# Patient Record
Sex: Male | Born: 1975 | Race: Black or African American | Hispanic: No | Marital: Married | Smoking: Never smoker
Health system: Southern US, Community
[De-identification: ages and names within clinical notes are randomized; demographics above are authoritative.]

## PROBLEM LIST (undated history)

## (undated) DIAGNOSIS — M109 Gout, unspecified: Secondary | ICD-10-CM

## (undated) DIAGNOSIS — I10 Essential (primary) hypertension: Secondary | ICD-10-CM

## (undated) DIAGNOSIS — M542 Cervicalgia: Secondary | ICD-10-CM

## (undated) DIAGNOSIS — K219 Gastro-esophageal reflux disease without esophagitis: Secondary | ICD-10-CM

## (undated) DIAGNOSIS — G8929 Other chronic pain: Secondary | ICD-10-CM

## (undated) DIAGNOSIS — E785 Hyperlipidemia, unspecified: Secondary | ICD-10-CM

## (undated) DIAGNOSIS — Z5189 Encounter for other specified aftercare: Secondary | ICD-10-CM

## (undated) DIAGNOSIS — K635 Polyp of colon: Secondary | ICD-10-CM

## (undated) HISTORY — PX: COLONOSCOPY: SHX174

## (undated) HISTORY — PX: ESOPHAGOGASTRODUODENOSCOPY: SHX1529

## (undated) HISTORY — PX: ANKLE FRACTURE SURGERY: SHX122

## (undated) HISTORY — DX: Hyperlipidemia, unspecified: E78.5

## (undated) HISTORY — DX: Encounter for other specified aftercare: Z51.89

## (undated) HISTORY — DX: Polyp of colon: K63.5

## (undated) HISTORY — PX: OTHER SURGICAL HISTORY: SHX169

---

## 1997-10-13 ENCOUNTER — Encounter: Admission: RE | Admit: 1997-10-13 | Discharge: 1997-10-13 | Payer: Self-pay | Admitting: *Deleted

## 1998-04-08 ENCOUNTER — Encounter: Payer: Self-pay | Admitting: Emergency Medicine

## 1998-04-08 ENCOUNTER — Emergency Department (HOSPITAL_COMMUNITY): Admission: EM | Admit: 1998-04-08 | Discharge: 1998-04-08 | Payer: Self-pay | Admitting: Emergency Medicine

## 2002-07-25 ENCOUNTER — Ambulatory Visit (HOSPITAL_COMMUNITY): Admission: RE | Admit: 2002-07-25 | Discharge: 2002-07-25 | Payer: Self-pay | Admitting: Gastroenterology

## 2002-07-28 ENCOUNTER — Encounter: Payer: Self-pay | Admitting: Internal Medicine

## 2002-07-28 ENCOUNTER — Encounter: Admission: RE | Admit: 2002-07-28 | Discharge: 2002-07-28 | Payer: Self-pay | Admitting: Internal Medicine

## 2002-08-11 ENCOUNTER — Encounter: Admission: RE | Admit: 2002-08-11 | Discharge: 2002-08-11 | Payer: Self-pay | Admitting: Gastroenterology

## 2002-08-11 ENCOUNTER — Encounter: Payer: Self-pay | Admitting: Gastroenterology

## 2002-08-29 ENCOUNTER — Ambulatory Visit (HOSPITAL_COMMUNITY): Admission: RE | Admit: 2002-08-29 | Discharge: 2002-08-29 | Payer: Self-pay | Admitting: Gastroenterology

## 2013-11-02 ENCOUNTER — Encounter (HOSPITAL_BASED_OUTPATIENT_CLINIC_OR_DEPARTMENT_OTHER): Payer: Self-pay | Admitting: Emergency Medicine

## 2013-11-02 ENCOUNTER — Emergency Department (HOSPITAL_BASED_OUTPATIENT_CLINIC_OR_DEPARTMENT_OTHER)
Admission: EM | Admit: 2013-11-02 | Discharge: 2013-11-02 | Disposition: A | Discharge status: Home / Self Care | Payer: BC Managed Care – PPO | Attending: Emergency Medicine | Admitting: Emergency Medicine

## 2013-11-02 DIAGNOSIS — I10 Essential (primary) hypertension: Secondary | ICD-10-CM | POA: Insufficient documentation

## 2013-11-02 DIAGNOSIS — K219 Gastro-esophageal reflux disease without esophagitis: Secondary | ICD-10-CM

## 2013-11-02 HISTORY — DX: Gastro-esophageal reflux disease without esophagitis: K21.9

## 2013-11-02 HISTORY — DX: Essential (primary) hypertension: I10

## 2013-11-02 LAB — BASIC METABOLIC PANEL
Anion gap: 13 (ref 5–15)
BUN: 9 mg/dL (ref 6–23)
CALCIUM: 9.1 mg/dL (ref 8.4–10.5)
CO2: 25 mEq/L (ref 19–32)
Chloride: 103 mEq/L (ref 96–112)
Creatinine, Ser: 1.1 mg/dL (ref 0.50–1.35)
GFR calc Af Amer: >90 mL/min (ref >90)
GFR, EST NON AFRICAN AMERICAN: 82 mL/min — AB (ref >90)
Glucose, Bld: 110 mg/dL — ABNORMAL HIGH (ref 70–99)
POTASSIUM: 4 meq/L (ref 3.7–5.3)
Sodium: 141 mEq/L (ref 137–147)

## 2013-11-02 LAB — TROPONIN I

## 2013-11-02 MED ORDER — ESOMEPRAZOLE MAGNESIUM 20 MG PO CPDR: 40.0000 mg | DELAYED_RELEASE_CAPSULE | Freq: Every morning | ORAL | Status: DC

## 2013-11-02 MED ORDER — LISINOPRIL 20 MG PO TABS: 40.0000 mg | ORAL_TABLET | Freq: Every day | ORAL | Status: DC

## 2013-11-02 NOTE — ED Notes (Signed)
C/o acid reflux states he takes nexium every day. States his reflux has been worse for last 3 days. Nothing over the counter seems to help. No other sx.

## 2013-11-02 NOTE — Discharge Instructions (Signed)
Hypertension  Hypertension, commonly called high blood pressure, is when the force of blood pumping through your arteries is too strong. Your arteries are the blood vessels that carry blood from your heart throughout your body. A blood pressure reading consists of a higher number over a lower number, such as 110/72. The higher number (systolic) is the pressure inside your arteries when your heart pumps. The lower number (diastolic) is the pressure inside your arteries when your heart relaxes. Ideally you want your blood pressure below 120/80.  Hypertension forces your heart to work harder to pump blood. Your arteries may become narrow or stiff. Having hypertension puts you at risk for heart disease, stroke, and other problems.   RISK FACTORS  Some risk factors for high blood pressure are controllable. Others are not.   Risk factors you cannot control include:   · Race. You may be at higher risk if you are African American.  · Age. Risk increases with age.  · Gender. Men are at higher risk than women before age 45 years. After age 65, women are at higher risk than men.  Risk factors you can control include:  · Not getting enough exercise or physical activity.  · Being overweight.  · Getting too much fat, sugar, calories, or salt in your diet.  · Drinking too much alcohol.  SIGNS AND SYMPTOMS  Hypertension does not usually cause signs or symptoms. Extremely high blood pressure (hypertensive crisis) may cause headache, anxiety, shortness of breath, and nosebleed.  DIAGNOSIS   To check if you have hypertension, your health care provider will measure your blood pressure while you are seated, with your arm held at the level of your heart. It should be measured at least twice using the same arm. Certain conditions can cause a difference in blood pressure between your right and left arms. A blood pressure reading that is higher than normal on one occasion does not mean that you need treatment. If one blood pressure reading  is high, ask your health care provider about having it checked again.  TREATMENT   Treating high blood pressure includes making lifestyle changes and possibly taking medication. Living a healthy lifestyle can help lower high blood pressure. You may need to change some of your habits.  Lifestyle changes may include:  · Following the DASH diet. This diet is high in fruits, vegetables, and whole grains. It is low in salt, red meat, and added sugars.  · Getting at least 2 1/2 hours of brisk physical activity every week.  · Losing weight if necessary.  · Not smoking.  · Limiting alcoholic beverages.  · Learning ways to reduce stress.   If lifestyle changes are not enough to get your blood pressure under control, your health care provider may prescribe medicine. You may need to take more than one. Work closely with your health care provider to understand the risks and benefits.  HOME CARE INSTRUCTIONS  · Have your blood pressure rechecked as directed by your health care provider.    · Only take medicine as directed by your health care provider. Follow the directions carefully. Blood pressure medicines must be taken as prescribed. The medicine does not work as well when you skip doses. Skipping doses also puts you at risk for problems.    · Do not smoke.    · Monitor your blood pressure at home as directed by your health care provider.   SEEK MEDICAL CARE IF:   · You think you are having a reaction to medicines taken.  ·   You have recurrent headaches or feel dizzy.  · You have swelling in your ankles.  · You have trouble with your vision.  SEEK IMMEDIATE MEDICAL CARE IF:  · You develop a severe headache or confusion.  · You have unusual weakness, numbness, or feel faint.  · You have severe chest or abdominal pain.  · You vomit repeatedly.  · You have trouble breathing.  MAKE SURE YOU:   · Understand these instructions.  · Will watch your condition.  · Will get help right away if you are not doing well or get  worse.  Document Released: 04/07/2005 Document Revised: 04/12/2013 Document Reviewed: 01/28/2013  ExitCare® Patient Information ©2015 [employer]. This information is not intended to replace advice given to you by your health care provider. Make sure you discuss any questions you have with your health care provider.  Gastroesophageal Reflux Disease, Adult  Gastroesophageal reflux disease (GERD) happens when acid from your stomach flows up into the esophagus. When acid comes in contact with the esophagus, the acid causes soreness (inflammation) in the esophagus. Over time, GERD may create small holes (ulcers) in the lining of the esophagus.  CAUSES   · Increased body weight. This puts pressure on the stomach, making acid rise from the stomach into the esophagus.  · Smoking. This increases acid production in the stomach.  · Drinking alcohol. This causes decreased pressure in the lower esophageal sphincter (valve or ring of muscle between the esophagus and stomach), allowing acid from the stomach into the esophagus.  · Late evening meals and a full stomach. This increases pressure and acid production in the stomach.  · A malformed lower esophageal sphincter.  Sometimes, no cause is found.  SYMPTOMS   · Burning pain in the lower part of the mid-chest behind the breastbone and in the mid-stomach area. This may occur twice a week or more often.  · Trouble swallowing.  · Sore throat.  · Dry cough.  · Asthma-like symptoms including chest tightness, shortness of breath, or wheezing.  DIAGNOSIS   Your caregiver may be able to diagnose GERD based on your symptoms. In some cases, X-rays and other tests may be done to check for complications or to check the condition of your stomach and esophagus.  TREATMENT   Your caregiver may recommend over-the-counter or prescription medicines to help decrease acid production. Ask your caregiver before starting or adding any new medicines.   HOME CARE INSTRUCTIONS   · Change the factors  that you can control. Ask your caregiver for guidance concerning weight loss, quitting smoking, and alcohol consumption.  · Avoid foods and drinks that make your symptoms worse, such as:  ¨ Caffeine or alcoholic drinks.  ¨ Chocolate.  ¨ Peppermint or mint flavorings.  ¨ Garlic and onions.  ¨ Spicy foods.  ¨ Citrus fruits, such as oranges, lemons, or limes.  ¨ Tomato-based foods such as sauce, chili, salsa, and pizza.  ¨ Fried and fatty foods.  · Avoid lying down for the 3 hours prior to your bedtime or prior to taking a nap.  · Eat small, frequent meals instead of large meals.  · Wear loose-fitting clothing. Do not wear anything tight around your waist that causes pressure on your stomach.  · Raise the head of your bed 6 to 8 inches with wood blocks to help you sleep. Extra pillows will not help.  · Only take over-the-counter or prescription medicines for pain, discomfort, or fever as directed by your caregiver.  ·   Do not take aspirin, ibuprofen, or other nonsteroidal anti-inflammatory drugs (NSAIDs).  SEEK IMMEDIATE MEDICAL CARE IF:   · You have pain in your arms, neck, jaw, teeth, or back.  · Your pain increases or changes in intensity or duration.  · You develop nausea, vomiting, or sweating (diaphoresis).  · You develop shortness of breath, or you faint.  · Your vomit is green, yellow, black, or looks like coffee grounds or blood.  · Your stool is red, bloody, or black.  These symptoms could be signs of other problems, such as heart disease, gastric bleeding, or esophageal bleeding.  MAKE SURE YOU:   · Understand these instructions.  · Will watch your condition.  · Will get help right away if you are not doing well or get worse.  Document Released: 01/15/2005 Document Revised: 06/30/2011 Document Reviewed: 10/25/2010  ExitCare® Patient Information ©2015 [employer]. This information is not intended to replace advice given to you by your health care provider. Make sure you discuss any questions you have with  your health care provider.

## 2013-11-02 NOTE — ED Provider Notes (Signed)
CSN: [id number]     Arrival date & time 11/02/13  4098 History   First MD Initiated Contact with Patient 11/02/13 0815     Chief Complaint  Patient presents with  . Gastrophageal Reflux     (Consider location/radiation/quality/duration/timing/severity/associated sxs/prior Treatment) Patient is a 38 y.o. male presenting with GERD. The history is provided by the patient.  Gastrophageal Reflux This is a recurrent problem. Associated symptoms include chest pain and abdominal pain. Pertinent negatives include no headaches and no shortness of breath.   patient has a history of GERD. He states his been worse over the last couple months. His pain in his epigastric area it was up to his chest. As dull. Maybe a little worse after eating. No fevers. No difficulty breathing. No weight loss no diarrhea or constipation. He states he has been on Nexium over-the-counter for 2 months. He's had no relief. He also states his blood pressures been running high. no trouble breathing. No difficulty with exertion. No peripheral edema. He's had previous endoscopy also.  Past Medical History  Diagnosis Date  . Hypertension   . GERD (gastroesophageal reflux disease)    History reviewed. No pertinent past surgical history. No family history on file. History  Substance Use Topics  . Smoking status: Never Smoker   . Smokeless tobacco: Not on file  . Alcohol Use: Not on file    Review of Systems  Constitutional: Negative for activity change and appetite change.  Eyes: Negative for pain.  Respiratory: Negative for chest tightness and shortness of breath.   Cardiovascular: Positive for chest pain. Negative for leg swelling.  Gastrointestinal: Positive for abdominal pain. Negative for nausea, vomiting and diarrhea.  Genitourinary: Negative for flank pain.  Musculoskeletal: Negative for back pain and neck stiffness.  Skin: Negative for rash.  Neurological: Negative for weakness, numbness and headaches.   Psychiatric/Behavioral: Negative for behavioral problems.      Allergies  Review of patient's allergies indicates no known allergies.  Home Medications   Prior to Admission medications   Medication Sig Start Date End Date Taking? Authorizing Provider  esomeprazole (NEXIUM) 20 MG capsule Take 2 capsules (40 mg total) by mouth every morning. 11/02/13   Juliet Rude. Dante Cooter, MD  lisinopril (PRINIVIL,ZESTRIL) 20 MG tablet Take 2 tablets (40 mg total) by mouth daily. 11/02/13   Juliet Rude. Ahman Dugdale, MD   BP 151/95  Pulse 73  Temp(Src) 98.2 F (36.8 C) (Oral)  Resp 18  Ht 5\' 9"  (1.753 m)  Wt 215 lb (97.523 kg)  BMI 31.74 kg/m2  SpO2 100% Physical Exam  Nursing note and vitals reviewed. Constitutional: He is oriented to person, place, and time. He appears well-developed and well-nourished.  HENT:  Head: Normocephalic and atraumatic.  Eyes: EOM are normal. Pupils are equal, round, and reactive to light.  Neck: Normal range of motion. Neck supple.  Cardiovascular: Normal rate, regular rhythm and normal heart sounds.   No murmur heard. Pulmonary/Chest: Effort normal and breath sounds normal.  Abdominal: Soft. Bowel sounds are normal. He exhibits no distension and no mass. There is no tenderness. There is no rebound and no guarding.  Musculoskeletal: Normal range of motion. He exhibits no edema.  Neurological: He is alert and oriented to person, place, and time. No cranial nerve deficit.  Skin: Skin is warm and dry.  Psychiatric: He has a normal mood and affect.    ED Course  Procedures (including critical care time) Labs Review Labs Reviewed  BASIC METABOLIC PANEL - Abnormal; Notable  for the following:    Glucose, Bld 110 (*)    GFR calc non Af Amer 82 (*)    All other components within normal limits  TROPONIN I    Imaging Review No results found.   EKG Interpretation   Date/Time:  Wednesday November 02 2013 08:29:05 EDT Ventricular Rate:  72 PR Interval:  152 QRS  Duration: 110 QT Interval:  354 QTC Calculation: 387 R Axis:   83 Text Interpretation:  Normal sinus rhythm Left ventricular hypertrophy  Nonspecific T wave abnormality Abnormal ECG Confirmed by Rubin PayorPICKERING  MD,  Harrold DonathNATHAN [phone]([id number]) on 11/02/2013 8:35:05 AM      MDM   Final diagnoses:  Gastroesophageal reflux disease, esophagitis presence not specified  Essential hypertension    Patient with history of GERD. Continued pain. Will increase his PPI. Also has hypertension with some nonspecific EKG changes. Will increase his lisinopril. Will need to followup with PCP and possibly gastroenterology    Juliet RudeNathan R. Rubin PayorPickering, MD 11/02/13 [phone]

## 2013-11-28 ENCOUNTER — Encounter (HOSPITAL_BASED_OUTPATIENT_CLINIC_OR_DEPARTMENT_OTHER): Payer: Self-pay | Admitting: Emergency Medicine

## 2013-11-28 ENCOUNTER — Emergency Department (HOSPITAL_BASED_OUTPATIENT_CLINIC_OR_DEPARTMENT_OTHER): Payer: BC Managed Care – PPO

## 2013-11-28 ENCOUNTER — Emergency Department (HOSPITAL_BASED_OUTPATIENT_CLINIC_OR_DEPARTMENT_OTHER)
Admission: EM | Admit: 2013-11-28 | Discharge: 2013-11-28 | Disposition: A | Discharge status: Home / Self Care | Payer: BC Managed Care – PPO | Attending: Emergency Medicine | Admitting: Emergency Medicine

## 2013-11-28 DIAGNOSIS — Y929 Unspecified place or not applicable: Secondary | ICD-10-CM | POA: Diagnosis not present

## 2013-11-28 DIAGNOSIS — X500XXA Overexertion from strenuous movement or load, initial encounter: Secondary | ICD-10-CM | POA: Insufficient documentation

## 2013-11-28 DIAGNOSIS — I10 Essential (primary) hypertension: Secondary | ICD-10-CM | POA: Diagnosis not present

## 2013-11-28 DIAGNOSIS — S82899A Other fracture of unspecified lower leg, initial encounter for closed fracture: Secondary | ICD-10-CM | POA: Insufficient documentation

## 2013-11-28 DIAGNOSIS — K219 Gastro-esophageal reflux disease without esophagitis: Secondary | ICD-10-CM | POA: Insufficient documentation

## 2013-11-28 DIAGNOSIS — S99919A Unspecified injury of unspecified ankle, initial encounter: Secondary | ICD-10-CM | POA: Insufficient documentation

## 2013-11-28 DIAGNOSIS — S8990XA Unspecified injury of unspecified lower leg, initial encounter: Secondary | ICD-10-CM | POA: Diagnosis present

## 2013-11-28 DIAGNOSIS — Z79899 Other long term (current) drug therapy: Secondary | ICD-10-CM | POA: Insufficient documentation

## 2013-11-28 DIAGNOSIS — Y9301 Activity, walking, marching and hiking: Secondary | ICD-10-CM | POA: Insufficient documentation

## 2013-11-28 DIAGNOSIS — S82831A Other fracture of upper and lower end of right fibula, initial encounter for closed fracture: Secondary | ICD-10-CM

## 2013-11-28 DIAGNOSIS — S99929A Unspecified injury of unspecified foot, initial encounter: Secondary | ICD-10-CM

## 2013-11-28 NOTE — Discharge Instructions (Signed)
Ankle Fracture A fracture is a break in a bone. A cast or splint may be used to protect the ankle and heal the break. Sometimes, surgery is needed. HOME CARE  Use crutches as told by your doctor. It is very important that you use your crutches correctly.  Do not put weight or pressure on the injured ankle until told by your doctor.  Keep your ankle raised (elevated) when sitting or lying down.  Apply ice to the ankle:  Put ice in a plastic bag.  Place a towel between your cast and the bag.  Leave the ice on for 20 minutes, 2-3 times a day.  If you have a plaster or fiberglass cast:  Do not try to scratch under the cast with any objects.  Check the skin around the cast every day. You may put lotion on red or sore areas.  Keep your cast dry and clean.  If you have a plaster splint:  Wear the splint as told by your doctor.  You can loosen the elastic around the splint if your toes get numb, tingle, or turn cold or blue.  Do not put pressure on any part of your cast or splint. It may break. Rest your plaster splint or cast only on a pillow the first 24 hours until it is fully hardened.  Cover your cast or splint with a plastic bag during showers.  Do not lower your cast or splint into water.  Take medicine as told by your doctor.  Do not drive until your doctor says it is safe.  Follow-up with your doctor as told. It is very important that you go to your follow-up visits. GET HELP IF: The swelling and discomfort gets worse.  GET HELP RIGHT AWAY IF:   Your splint or cast breaks.  You continue to have very bad pain.  You have new pain or swelling after your splint or cast was put on.  Your skin or toes below the injured ankle:  Turn blue or gray.  Feel cold, numb, or you cannot feel them.  There is a bad smell or yellowish white fluid (pus) coming from under the splint or cast. MAKE SURE YOU:   Understand these instructions.  Will watch your  condition.  Will get help right away if you are not doing well or get worse. Document Released: 02/02/2009 Document Revised: 01/26/2013 Document Reviewed: 11/04/2012 [facility] Patient Information 2015 [place], [place]. This information is not intended to replace advice given to you by your health care provider. Make sure you discuss any questions you have with your health care provider.  Your doctor has applied a splint to rest and protect your injury. Try to keep your splint clean and dry. They can be used for weeks if needed to treat serious sprains, or minor fractures. Do not put objects under your splint to scratch yourself. Do not put weight on a splint unless told to do so. Call or return immediately if you have: Increased pain or pressure around the injury.  Numbness, tingling, painful, cool toes or fingers.  Swelling or inability to move fingers or toes.  Color change to fingers or toes (blue or gray).  Wetness to splint.  Sore areas, foul odor, or redness from inside the splint.

## 2013-11-28 NOTE — ED Provider Notes (Signed)
CSN: [id number]     Arrival date & time 11/28/13  1320 History   First MD Initiated Contact with Patient 11/28/13 1433     Chief Complaint  Patient presents with  . Ankle Pain     (Consider location/radiation/quality/duration/timing/severity/associated sxs/prior Treatment) HPI 38 year old male twisted his right ankle last night walking downstairs is able to partially weight bear but still his right lateral ankle pain with minimal pain to the medial ankle skin is intact no distal weakness or numbness no other injury with no pain to his foot knee hip and no head or neck injury no back pain chest pain shortness breath abdominal pain injury to his left leg or injury to his arms or other concerns. He does not want any pain medicine. Past Medical History  Diagnosis Date  . Hypertension   . GERD (gastroesophageal reflux disease)    History reviewed. No pertinent past surgical history. History reviewed. No pertinent family history. History  Substance Use Topics  . Smoking status: Never Smoker   . Smokeless tobacco: Not on file  . Alcohol Use: No    Review of Systems See HPI.   Allergies  Review of patient's allergies indicates no known allergies.  Home Medications   Prior to Admission medications   Medication Sig Start Date End Date Taking? Authorizing Provider  esomeprazole (NEXIUM) 20 MG capsule Take 2 capsules (40 mg total) by mouth every morning. 11/02/13   Juliet RudeNathan R. Pickering, MD  lisinopril (PRINIVIL,ZESTRIL) 20 MG tablet Take 2 tablets (40 mg total) by mouth daily. 11/02/13   Juliet RudeNathan R. Pickering, MD   BP 142/103  Pulse 102  Temp(Src) 98.7 F (37.1 C) (Oral)  Resp 18  Ht 5\' 9"  (1.753 m)  Wt 219 lb (99.338 kg)  BMI 32.33 kg/m2  SpO2 98% Physical Exam  Nursing note and vitals reviewed. Constitutional:  Awake, alert, nontoxic appearance.  HENT:  Head: Atraumatic.  Eyes: Right eye exhibits no discharge. Left eye exhibits no discharge.  Neck: Neck supple.  C-S NT   Pulmonary/Chest: Effort normal. He exhibits no tenderness.  Abdominal: Soft. There is no tenderness. There is no rebound.  Musculoskeletal: He exhibits no edema.  Baseline ROM, no obvious new focal weakness. Both arms and left leg nontender. Right leg is nontender at the hip thigh knee proximal fibula calf Achilles tendon calcaneus and midfoot forefoot. Right foot dorsalis pedis pulse intact with capillary refill less than 2 seconds normal light touch including the first dorsal webspace. Right ankle has mild swelling laterally minimal if any swelling medially minimal medial tenderness over the deltoid region and medial malleolus with moderate tenderness over the lateral malleolus and lateral ankle ligaments  Neurological: He is alert.  Mental status and motor strength appears baseline for patient and situation.  Skin: No rash noted.  Psychiatric: He has a normal mood and affect.    ED Course  Procedures (including critical care time) Patient / Family / Caregiver informed of clinical course, understand medical decision-making process, and agree with plan. Labs Review Labs Reviewed - No data to display  Imaging Review No results found.   EKG Interpretation None      MDM   Final diagnoses:  Fracture of fibula, distal, right, closed    The patient appears reasonably screened and/or stabilized for discharge and I doubt any other medical condition or other [facility] requiring further screening, evaluation, or treatment in the ED at this time prior to discharge.I doubt any other [facility] precluding discharge at this time including,  but not necessarily limited to the following:compartment syndrome.   Hurman Horn, MD 12/06/13 [phone]

## 2013-11-28 NOTE — ED Notes (Signed)
Pt. Advised to keep up with his blood pressure at home when not in pain and take his medications as ordered. Blood pressure high on discharge.

## 2013-11-28 NOTE — ED Notes (Signed)
Pt c/o right ankle "twisted" walking down stairs last pm

## 2014-01-26 ENCOUNTER — Encounter (HOSPITAL_BASED_OUTPATIENT_CLINIC_OR_DEPARTMENT_OTHER): Payer: Self-pay | Admitting: Emergency Medicine

## 2014-01-26 ENCOUNTER — Emergency Department (HOSPITAL_BASED_OUTPATIENT_CLINIC_OR_DEPARTMENT_OTHER)
Admission: EM | Admit: 2014-01-26 | Discharge: 2014-01-26 | Disposition: A | Discharge status: Home / Self Care | Payer: BC Managed Care – PPO | Attending: Emergency Medicine | Admitting: Emergency Medicine

## 2014-01-26 DIAGNOSIS — K219 Gastro-esophageal reflux disease without esophagitis: Secondary | ICD-10-CM | POA: Diagnosis not present

## 2014-01-26 DIAGNOSIS — Z79899 Other long term (current) drug therapy: Secondary | ICD-10-CM | POA: Diagnosis not present

## 2014-01-26 DIAGNOSIS — I10 Essential (primary) hypertension: Secondary | ICD-10-CM | POA: Insufficient documentation

## 2014-01-26 DIAGNOSIS — M545 Low back pain: Secondary | ICD-10-CM | POA: Insufficient documentation

## 2014-01-26 DIAGNOSIS — R141 Gas pain: Secondary | ICD-10-CM | POA: Insufficient documentation

## 2014-01-26 DIAGNOSIS — Z87448 Personal history of other diseases of urinary system: Secondary | ICD-10-CM

## 2014-01-26 LAB — URINALYSIS, ROUTINE W REFLEX MICROSCOPIC
Bilirubin Urine: NEGATIVE
GLUCOSE, UA: NEGATIVE mg/dL
Hgb urine dipstick: NEGATIVE
KETONES UR: NEGATIVE mg/dL
LEUKOCYTES UA: NEGATIVE
Nitrite: NEGATIVE
PROTEIN: NEGATIVE mg/dL
Specific Gravity, Urine: 1.014 (ref 1.005–1.030)
UROBILINOGEN UA: 0.2 mg/dL (ref 0.0–1.0)
pH: 6.5 (ref 5.0–8.0)

## 2014-01-26 MED ORDER — DICYCLOMINE HCL 10 MG PO CAPS
10.0000 mg | ORAL_CAPSULE | Freq: Once | ORAL | Status: AC
  Administered 2014-01-26: 10 mg via ORAL
  Filled 2014-01-26: qty 1

## 2014-01-26 MED ORDER — DICYCLOMINE HCL 20 MG PO TABS: 20.0000 mg | ORAL_TABLET | Freq: Two times a day (BID) | ORAL | Status: DC

## 2014-01-26 NOTE — ED Provider Notes (Signed)
CSN: [id number]     Arrival date & time 01/26/14  1434 History   First MD Initiated Contact with Patient 01/26/14 1545     Chief Complaint  Patient presents with  . Abdominal Pain     (Consider location/radiation/quality/duration/timing/severity/associated sxs/prior Treatment) HPI Comments: Patient is a 38 year old male past medical history significant for hypertension, GERD presenting to the emergency department for 2 complaints. Patient states 2 days ago he was using the restroom, straining when he noted bright red blood in his urine. He states it was not painful and has not had any recurrence of episodes since. Patient is also complaining about bilateral low back pain, describes it as "gas pain" that improves after he passes gas. He has tried Gas-X with minimal improvement of his gas pains. Patient denies any fevers, chills, nausea, vomiting, abdominal pain, penile or testicular swelling or pain. He has not had any difficulty passing bowel movements. No abdominal surgical history. Patient denies any concern for sexually transmitted diseases.  Patient is a 38 y.o. male presenting with abdominal pain.  Abdominal Pain Associated symptoms: hematuria     Past Medical History  Diagnosis Date  . Hypertension   . GERD (gastroesophageal reflux disease)    History reviewed. No pertinent past surgical history. No family history on file. History  Substance Use Topics  . Smoking status: Never Smoker   . Smokeless tobacco: Not on file  . Alcohol Use: No    Review of Systems  Genitourinary: Positive for hematuria.  Musculoskeletal: Positive for back pain.  All other systems reviewed and are negative.     Allergies  Review of patient's allergies indicates no known allergies.  Home Medications   Prior to Admission medications   Medication Sig Start Date End Date Taking? Authorizing Provider  dicyclomine (BENTYL) 20 MG tablet Take 1 tablet (20 mg total) by mouth 2 (two) times daily.  01/26/14   Chenille Toor L Shashank Kwasnik, PA-C  esomeprazole (NEXIUM) 20 MG capsule Take 2 capsules (40 mg total) by mouth every morning. 11/02/13   Juliet Rude. Pickering, MD  lisinopril (PRINIVIL,ZESTRIL) 20 MG tablet Take 2 tablets (40 mg total) by mouth daily. 11/02/13   Juliet Rude. Pickering, MD   BP 155/93  Pulse 79  Temp(Src) 98.5 F (36.9 C) (Oral)  Resp 16  Ht 5\' 9"  (1.753 m)  Wt 219 lb (99.338 kg)  BMI 32.33 kg/m2  SpO2 99% Physical Exam  Nursing note and vitals reviewed. Constitutional: He is oriented to person, place, and time. He appears well-developed and well-nourished. No distress.  HENT:  Head: Normocephalic and atraumatic.  Right Ear: External ear normal.  Left Ear: External ear normal.  Nose: Nose normal.  Mouth/Throat: Oropharynx is clear and moist. No oropharyngeal exudate.  Eyes: Conjunctivae are normal.  Neck: Normal range of motion. Neck supple.  Cardiovascular: Normal rate, regular rhythm and normal heart sounds.   Pulmonary/Chest: Effort normal and breath sounds normal. No respiratory distress.  Abdominal: Soft. Bowel sounds are normal. He exhibits no distension. There is no tenderness. There is no rebound and no guarding.  Genitourinary:  Deferred   Musculoskeletal: Normal range of motion. He exhibits no edema.  Neurological: He is alert and oriented to person, place, and time.  Skin: Skin is warm and dry. He is not diaphoretic.  Psychiatric: He has a normal mood and affect.    ED Course  Procedures (including critical care time) Medications  dicyclomine (BENTYL) capsule 10 mg (10 mg Oral Given 01/26/14 1646)  Labs Review Labs Reviewed  URINE CULTURE  URINALYSIS, ROUTINE W REFLEX MICROSCOPIC    Imaging Review No results found.   EKG Interpretation None      MDM   Final diagnoses:  H/O hematuria  Gas pain    Filed Vitals:   01/26/14 1649  BP: 155/93  Pulse: 79  Temp:   Resp: 16   Afebrile, NAD, non-toxic appearing, AAOx4.   1) H/o  hematuria: Patient refused GU examination and STD check. UA unremarkable. Abdominal examination bengin. No CVA tenderness. Will send urine culture to ensure no infection.  2) Gas pain: Patient given bentyl for gas pains as requested.   Advised primary care physician followup. Return precautions discussed. Patient agreeable to plan. Patient stable upon discharge.  Jeannetta EllisJennifer L Laurren Lepkowski, PA-C 01/26/14 2144

## 2014-01-26 NOTE — Discharge Instructions (Signed)
Please follow up with your primary care physician in 1-2 days. If you do not have one please call the [facility] number listed above. Please take bentyl as prescribed. Please read all discharge instructions and return precautions.   Hematuria Hematuria is blood in your urine. It can be caused by a bladder infection, kidney infection, prostate infection, kidney stone, or cancer of your urinary tract. Infections can usually be treated with medicine, and a kidney stone usually will pass through your urine. If neither of these is the cause of your hematuria, further workup to find out the reason may be needed. It is very important that you tell your health care provider about any blood you see in your urine, even if the blood stops without treatment or happens without causing pain. Blood in your urine that happens and then stops and then happens again can be a symptom of a very serious condition. Also, pain is not a symptom in the initial stages of many urinary cancers. HOME CARE INSTRUCTIONS   Drink lots of fluid, 3-4 quarts a day. If you have been diagnosed with an infection, cranberry juice is especially recommended, in addition to large amounts of water.  Avoid caffeine, tea, and carbonated beverages because they tend to irritate the bladder.  Avoid alcohol because it may irritate the prostate.  Take all medicines as directed by your health care provider.  If you were prescribed an antibiotic medicine, finish it all even if you start to feel better.  If you have been diagnosed with a kidney stone, follow your health care provider's instructions regarding straining your urine to catch the stone.  Empty your bladder often. Avoid holding urine for long periods of time.  After a bowel movement, women should cleanse front to back. Use each tissue only once.  Empty your bladder before and after sexual intercourse if you are a male. SEEK MEDICAL CARE IF:  You develop back  pain.  You have a fever.  You have a feeling of sickness in your stomach (nausea) or vomiting.  Your symptoms are not better in 3 days. Return sooner if you are getting worse. SEEK IMMEDIATE MEDICAL CARE IF:   You develop severe vomiting and are unable to keep the medicine down.  You develop severe back or abdominal pain despite taking your medicines.  You begin passing a large amount of blood or clots in your urine.  You feel extremely weak or faint, or you pass out. MAKE SURE YOU:   Understand these instructions.  Will watch your condition.  Will get help right away if you are not doing well or get worse. Document Released: 04/07/2005 Document Revised: 08/22/2013 Document Reviewed: 12/06/2012 [facility] Patient Information 2015 [place]. This information is not intended to replace advice given to you by your health care provider. Make sure you discuss any questions you have with your health care provider.   Flatulence There are good germs in your gut to help you digest food. Gas is produced by these germs and released from your bottom. Most people release 3 to 4 quarts of gas every day. This is normal. HOME CARE  Eat or drink less of the foods or liquids that give you gas.  Take the time to chew your food well. Talk less while you eat.  Do not suck on ice or hard candy.  Sip slowly. Stir some of the bubbles out of fizzy drinks with a spoon or straw.  Avoid chewing gum or smoking.  Ask your doctor about liquids and tablets that may help control burping and gas.  Only take medicine as told by your doctor. GET HELP RIGHT AWAY IF:   There is discomfort when you burp or pass gas.  You throw up (vomit) when you burp.  Poop (stool) comes out when you pass gas.  Your belly is puffy (swollen) and hard. MAKE SURE YOU:   Understand these instructions.  Will watch your condition.  Will get help right away if you are not doing well or get worse. Document  Released: 02/08/2008 Document Revised: 06/30/2011 Document Reviewed: 02/08/2008 [facility] Patient Information 2015 [place], [place]. This information is not intended to replace advice given to you by your health care provider. Make sure you discuss any questions you have with your health care provider.

## 2014-01-26 NOTE — ED Notes (Signed)
Hematuria 2 days ago. Lower abdominal and bilateral flank pain.

## 2014-01-27 NOTE — ED Provider Notes (Signed)
Medical screening examination/treatment/procedure(s) were performed by non-physician practitioner and as supervising physician I was immediately available for consultation/collaboration.  Gilda Creasehristopher J. Pollina, MD 01/27/14 [phone]

## 2014-01-28 LAB — URINE CULTURE
Colony Count: NO GROWTH
Culture: NO GROWTH

## 2014-03-08 ENCOUNTER — Ambulatory Visit (INDEPENDENT_AMBULATORY_CARE_PROVIDER_SITE_OTHER): Payer: BC Managed Care – PPO | Admitting: Physician Assistant

## 2014-03-08 ENCOUNTER — Encounter: Payer: Self-pay | Admitting: Physician Assistant

## 2014-03-08 VITALS — BP 167/116 | HR 90 | Temp 98.0°F | Ht 69.3 in | Wt 229.0 lb

## 2014-03-08 DIAGNOSIS — K219 Gastro-esophageal reflux disease without esophagitis: Secondary | ICD-10-CM

## 2014-03-08 DIAGNOSIS — I10 Essential (primary) hypertension: Secondary | ICD-10-CM | POA: Insufficient documentation

## 2014-03-08 MED ORDER — LISINOPRIL-HYDROCHLOROTHIAZIDE 20-12.5 MG PO TABS: 1.0000 | ORAL_TABLET | Freq: Every day | ORAL | Status: DC

## 2014-03-08 NOTE — Assessment & Plan Note (Signed)
Well-controlled.  Continue current regimen. Avoid trigger foods.

## 2014-03-08 NOTE — Progress Notes (Signed)
Patient presents to clinic today to establish care.  Chronic Issues: Hypertension -- Patient endorses history.  Has been previously treated with Lisinopril 20 mg daily without improvement in symptoms.  Denies chest pain, palpitations, vision changes.  Endorses some lightheadedness and some blurring of vision.  GERD --  Denies abdominal pain, vomiting, melena or hematochezia.  Is currently on Nexium 20 mg with good relief of reflux.  Denies chronic cough.   Past Medical History  Diagnosis Date  . Hypertension   . GERD (gastroesophageal reflux disease)     Past Surgical History  Procedure Laterality Date  . Negative      Current Outpatient Prescriptions on File Prior to Visit  Medication Sig Dispense Refill  . dicyclomine (BENTYL) 20 MG tablet Take 1 tablet (20 mg total) by mouth 2 (two) times daily. 20 tablet 0  . esomeprazole (NEXIUM) 20 MG capsule Take 2 capsules (40 mg total) by mouth every morning. 60 capsule 0   No current facility-administered medications on file prior to visit.    No Known Allergies  No family history on file.  History   Social History  . Marital Status: Married    Spouse Name: N/A    Number of Children: N/A  . Years of Education: N/A   Occupational History  . Not on file.   Social History Main Topics  . Smoking status: Never Smoker   . Smokeless tobacco: Never Used  . Alcohol Use: 0.0 oz/week    0 Not specified per week     Comment: social  . Drug Use: No  . Sexual Activity: No   Other Topics Concern  . Not on file   Social History Narrative   ROS See HPI.  Pertinent ROS are listed in HPI.  BP 167/116 mmHg  Pulse 90  Temp(Src) 98 F (36.7 C)  Ht 5' 9.3" (1.76 m)  Wt 229 lb (103.874 kg)  BMI 33.53 kg/m2  SpO2 99%  Physical Exam  Constitutional: He is oriented to person, place, and time and well-developed, well-nourished, and in no distress.  HENT:  Head: Normocephalic and atraumatic.  Eyes: Conjunctivae are normal.    Neck: Neck supple.  Cardiovascular: Normal rate, regular rhythm, normal heart sounds and intact distal pulses.   Pulmonary/Chest: Effort normal and breath sounds normal. No respiratory distress. He has no wheezes. He has no rales. He exhibits no tenderness.  Neurological: He is alert and oriented to person, place, and time.  Skin: Skin is warm and dry. No rash noted.  Psychiatric: Affect normal.  Vitals reviewed.   Recent Results (from the past 2160 hour(s))  Urinalysis, Routine w reflex microscopic     Status: None   Collection Time: 01/26/14  2:40 PM  Result Value Ref Range   Color, Urine YELLOW YELLOW   APPearance CLEAR CLEAR   Specific Gravity, Urine 1.014 1.005 - 1.030   pH 6.5 5.0 - 8.0   Glucose, UA NEGATIVE NEGATIVE mg/dL   Hgb urine dipstick NEGATIVE NEGATIVE   Bilirubin Urine NEGATIVE NEGATIVE   Ketones, ur NEGATIVE NEGATIVE mg/dL   Protein, ur NEGATIVE NEGATIVE mg/dL   Urobilinogen, UA 0.2 0.0 - 1.0 mg/dL   Nitrite NEGATIVE NEGATIVE   Leukocytes, UA NEGATIVE NEGATIVE    Comment: MICROSCOPIC NOT DONE ON URINES WITH NEGATIVE PROTEIN, BLOOD, LEUKOCYTES, NITRITE, OR GLUCOSE <1000 mg/dL.  Urine culture     Status: None   Collection Time: 01/26/14  2:40 PM  Result Value Ref Range   Specimen Description  URINE, RANDOM    Special Requests NONE    Culture  Setup Time      01/27/2014 08:54 Performed at [facility]   Colony Count NO GROWTH Performed at [facility]    Culture NO GROWTH Performed at [facility]    Report Status 01/28/2014 FINAL     Assessment/Plan: Essential hypertension, benign Will stop lisinopril.  Will begin lisinopril-HCTZ daily.  DASH diet encouraged. Handout given.  Follow-up in 1 month.  GERD (gastroesophageal reflux disease) Well-controlled.  Continue current regimen. Avoid trigger foods.

## 2014-03-08 NOTE — Progress Notes (Signed)
Pre visit review using our clinic review tool, if applicable. No additional management support is needed unless otherwise documented below in the visit note. 

## 2014-03-08 NOTE — Patient Instructions (Signed)
Please start Lisinopril-HCTZ daily as directed.  Stay well hydrated.  Read the information below on the DASH diet. Please start to implement some of these strategies to lower your blood pressure.  Follow-up with me in 1 month for BP recheck and a physical.  DASH Eating Plan DASH stands for "Dietary Approaches to Stop Hypertension." The DASH eating plan is a healthy eating plan that has been shown to reduce high blood pressure (hypertension). Additional health benefits may include reducing the risk of type 2 diabetes mellitus, heart disease, and stroke. The DASH eating plan may also help with weight loss. WHAT DO I NEED TO KNOW ABOUT THE DASH EATING PLAN? For the DASH eating plan, you will follow these general guidelines:  Choose foods with a percent daily value for sodium of less than 5% (as listed on the food label).  Use salt-free seasonings or herbs instead of table salt or sea salt.  Check with your health care provider or pharmacist before using salt substitutes.  Eat lower-sodium products, often labeled as "lower sodium" or "no salt added."  Eat fresh foods.  Eat more vegetables, fruits, and low-fat dairy products.  Choose whole grains. Look for the word "whole" as the first word in the ingredient list.  Choose fish and skinless chicken or [place] more often than red meat. Limit fish, poultry, and meat to 6 oz (170 g) each day.  Limit sweets, desserts, sugars, and sugary drinks.  Choose heart-healthy fats.  Limit cheese to 1 oz (28 g) per day.  Eat more home-cooked food and less restaurant, buffet, and fast food.  Limit fried foods.  Cook foods using methods other than frying.  Limit canned vegetables. If you do use them, rinse them well to decrease the sodium.  When eating at a restaurant, ask that your food be prepared with less salt, or no salt if possible. WHAT FOODS CAN I EAT? Seek help from a dietitian for individual calorie needs. Grains Whole grain or whole wheat  bread. Brown rice. Whole grain or whole wheat pasta. Quinoa, bulgur, and whole grain cereals. Low-sodium cereals. Corn or whole wheat flour tortillas. Whole grain cornbread. Whole grain crackers. Low-sodium crackers. Vegetables Fresh or frozen vegetables (raw, steamed, roasted, or grilled). Low-sodium or reduced-sodium tomato and vegetable juices. Low-sodium or reduced-sodium tomato sauce and paste. Low-sodium or reduced-sodium canned vegetables.  Fruits All fresh, canned (in natural juice), or frozen fruits. Meat and Other Protein Products Ground beef (85% or leaner), grass-fed beef, or beef trimmed of fat. Skinless chicken or [place]. Ground chicken or [place]. Pork trimmed of fat. All fish and seafood. Eggs. Dried beans, peas, or lentils. Unsalted nuts and seeds. Unsalted canned beans. Dairy Low-fat dairy products, such as skim or 1% milk, 2% or reduced-fat cheeses, low-fat ricotta or cottage cheese, or plain low-fat yogurt. Low-sodium or reduced-sodium cheeses. Fats and Oils Tub margarines without trans fats. Light or reduced-fat mayonnaise and salad dressings (reduced sodium). Avocado. Safflower, olive, or canola oils. Natural peanut or almond butter. Other Unsalted popcorn and pretzels. The items listed above may not be a complete list of recommended foods or beverages. Contact your dietitian for more options. WHAT FOODS ARE NOT RECOMMENDED? Grains White bread. White pasta. White rice. Refined cornbread. Bagels and croissants. Crackers that contain trans fat. Vegetables Creamed or fried vegetables. Vegetables in a cheese sauce. Regular canned vegetables. Regular canned tomato sauce and paste. Regular tomato and vegetable juices. Fruits Dried fruits. Canned fruit in light or heavy syrup. Fruit juice. Meat and  Other Protein Products Fatty cuts of meat. Ribs, chicken wings, bacon, sausage, bologna, salami, chitterlings, fatback, hot dogs, bratwurst, and packaged luncheon meats. Salted nuts and  seeds. Canned beans with salt. Dairy Whole or 2% milk, cream, half-and-half, and cream cheese. Whole-fat or sweetened yogurt. Full-fat cheeses or blue cheese. Nondairy creamers and whipped toppings. Processed cheese, cheese spreads, or cheese curds. Condiments Onion and garlic salt, seasoned salt, table salt, and sea salt. Canned and packaged gravies. Worcestershire sauce. Tartar sauce. Barbecue sauce. Teriyaki sauce. Soy sauce, including reduced sodium. Steak sauce. Fish sauce. Oyster sauce. Cocktail sauce. Horseradish. Ketchup and mustard. Meat flavorings and tenderizers. Bouillon cubes. Hot sauce. Tabasco sauce. Marinades. Taco seasonings. Relishes. Fats and Oils Butter, stick margarine, lard, shortening, ghee, and bacon fat. Coconut, palm kernel, or palm oils. Regular salad dressings. Other Pickles and olives. Salted popcorn and pretzels. The items listed above may not be a complete list of foods and beverages to avoid. Contact your dietitian for more information. WHERE CAN I FIND MORE INFORMATION? National Heart, Lung, and Blood Institute: travelstabloid.com Document Released: 03/27/2011 Document Revised: 08/22/2013 Document Reviewed: 02/09/2013 [facility] Patient Information 2015 [place], [place]. This information is not intended to replace advice given to you by your health care provider. Make sure you discuss any questions you have with your health care provider.

## 2014-03-08 NOTE — Assessment & Plan Note (Signed)
Will stop lisinopril.  Will begin lisinopril-HCTZ daily.  DASH diet encouraged. Handout given.  Follow-up in 1 month.

## 2014-04-07 ENCOUNTER — Ambulatory Visit: Payer: BC Managed Care – PPO | Admitting: Physician Assistant

## 2014-04-13 ENCOUNTER — Encounter (HOSPITAL_BASED_OUTPATIENT_CLINIC_OR_DEPARTMENT_OTHER): Payer: Self-pay

## 2014-04-13 ENCOUNTER — Emergency Department (HOSPITAL_BASED_OUTPATIENT_CLINIC_OR_DEPARTMENT_OTHER)
Admission: EM | Admit: 2014-04-13 | Discharge: 2014-04-13 | Disposition: A | Discharge status: Home / Self Care | Payer: BC Managed Care – PPO | Attending: Emergency Medicine | Admitting: Emergency Medicine

## 2014-04-13 DIAGNOSIS — K219 Gastro-esophageal reflux disease without esophagitis: Secondary | ICD-10-CM | POA: Diagnosis not present

## 2014-04-13 DIAGNOSIS — I10 Essential (primary) hypertension: Secondary | ICD-10-CM | POA: Diagnosis not present

## 2014-04-13 DIAGNOSIS — Z79899 Other long term (current) drug therapy: Secondary | ICD-10-CM | POA: Diagnosis not present

## 2014-04-13 DIAGNOSIS — R42 Dizziness and giddiness: Secondary | ICD-10-CM | POA: Insufficient documentation

## 2014-04-13 LAB — CBC WITH DIFFERENTIAL/PLATELET
BASOS ABS: 0 10*3/uL (ref 0.0–0.1)
Basophils Relative: 0 % (ref 0–1)
Eosinophils Absolute: 0.1 10*3/uL (ref 0.0–0.7)
Eosinophils Relative: 2 % (ref 0–5)
HEMATOCRIT: 42.9 % (ref 39.0–52.0)
Hemoglobin: 15.6 g/dL (ref 13.0–17.0)
LYMPHS PCT: 42 % (ref 12–46)
Lymphs Abs: 1.8 10*3/uL (ref 0.7–4.0)
MCH: 32.6 pg (ref 26.0–34.0)
MCHC: 36.4 g/dL — ABNORMAL HIGH (ref 30.0–36.0)
MCV: 89.7 fL (ref 78.0–100.0)
Monocytes Absolute: 0.5 10*3/uL (ref 0.1–1.0)
Monocytes Relative: 13 % — ABNORMAL HIGH (ref 3–12)
NEUTROS ABS: 1.8 10*3/uL (ref 1.7–7.7)
Neutrophils Relative %: 43 % (ref 43–77)
PLATELETS: 243 10*3/uL (ref 150–400)
RBC: 4.78 MIL/uL (ref 4.22–5.81)
RDW: 12.6 % (ref 11.5–15.5)
WBC: 4.3 10*3/uL (ref 4.0–10.5)

## 2014-04-13 LAB — URINALYSIS, ROUTINE W REFLEX MICROSCOPIC
BILIRUBIN URINE: NEGATIVE
GLUCOSE, UA: NEGATIVE mg/dL
HGB URINE DIPSTICK: NEGATIVE
KETONES UR: NEGATIVE mg/dL
Leukocytes, UA: NEGATIVE
Nitrite: NEGATIVE
PROTEIN: NEGATIVE mg/dL
Specific Gravity, Urine: 1.006 (ref 1.005–1.030)
Urobilinogen, UA: 0.2 mg/dL (ref 0.0–1.0)
pH: 6.5 (ref 5.0–8.0)

## 2014-04-13 LAB — COMPREHENSIVE METABOLIC PANEL
ALK PHOS: 77 U/L (ref 39–117)
ALT: 19 U/L (ref 0–53)
AST: 20 U/L (ref 0–37)
Albumin: 4.7 g/dL (ref 3.5–5.2)
Anion gap: 10 (ref 5–15)
BILIRUBIN TOTAL: 0.5 mg/dL (ref 0.3–1.2)
BUN: 13 mg/dL (ref 6–23)
CHLORIDE: 99 meq/L (ref 96–112)
CO2: 28 mmol/L (ref 19–32)
Calcium: 10.1 mg/dL (ref 8.4–10.5)
Creatinine, Ser: 1.12 mg/dL (ref 0.50–1.35)
GFR calc non Af Amer: 81 mL/min — ABNORMAL LOW (ref >90)
Glucose, Bld: 85 mg/dL (ref 70–99)
POTASSIUM: 3.6 mmol/L (ref 3.5–5.1)
SODIUM: 137 mmol/L (ref 135–145)
Total Protein: 8.7 g/dL — ABNORMAL HIGH (ref 6.0–8.3)

## 2014-04-13 LAB — TROPONIN I

## 2014-04-13 LAB — CBG MONITORING, ED: Glucose-Capillary: 95 mg/dL (ref 70–99)

## 2014-04-13 NOTE — ED Notes (Signed)
C/o dizziness, lightheaded x "couple months"--worse after eating-s/s worse x 1 week-denies pain

## 2014-04-13 NOTE — ED Notes (Signed)
Patient states he has a one month history of lightheadedness after eating.  States he develops this after eating anything and has worsened over the last week.

## 2014-04-13 NOTE — ED Provider Notes (Signed)
CSN: [id number]     Arrival date & time 04/13/14  1459 History   First MD Initiated Contact with Patient 04/13/14 1616     Chief Complaint  Patient presents with  . Dizziness     (Consider location/radiation/quality/duration/timing/severity/associated sxs/prior Treatment) HPI  Pt with hx of HTN, GERD presents with c/o sensation of feeling lightheaded after eating.  He states this has happened for over 1 month.  He states the symptoms seemed worse today after eating lunch.  No chest pain.  No focal weakness, no changes in speech or vision.  No fainting.  Denies vertigo or sensation of spinning.  He states symptoms are worse after eating a large meal.  He has HTN and was started one month ago on lisinopril/HCTZ- he states the symptoms preceded starting on this medication.  There are no other associated systemic symptoms, there are no other alleviating or modifying factors.   Past Medical History  Diagnosis Date  . Hypertension   . GERD (gastroesophageal reflux disease)    Past Surgical History  Procedure Laterality Date  . Negative     No family history on file. History  Substance Use Topics  . Smoking status: Never Smoker   . Smokeless tobacco: Never Used  . Alcohol Use: 0.0 oz/week    0 Not specified per week     Comment: social    Review of Systems  ROS reviewed and all otherwise negative except for mentioned in HPI    Allergies  Review of patient's allergies indicates no known allergies.  Home Medications   Prior to Admission medications   Medication Sig Start Date End Date Taking? Authorizing Provider  esomeprazole (NEXIUM) 20 MG capsule Take 2 capsules (40 mg total) by mouth every morning. 11/02/13   Juliet RudeNathan R. Pickering, MD  lisinopril-hydrochlorothiazide (ZESTORETIC) 20-12.5 MG per tablet Take 1 tablet by mouth daily. 03/08/14   Waldon MerlWilliam C Martin, PA-C   BP 130/98 mmHg  Pulse 78  Temp(Src) 98.2 F (36.8 C) (Oral)  Resp 16  Ht 5\' 9"  (1.753 m)  Wt 220 lb (99.791  kg)  BMI 32.47 kg/m2  SpO2 100%  Vitals reviewed Physical Exam  Physical Examination: General appearance - alert, well appearing, and in no distress Mental status - alert, oriented to person, place, and time Eyes - no conjunctival injection, no scleral icterus Mouth - mucous membranes moist, pharynx normal without lesions Chest - clear to auscultation, no wheezes, rales or rhonchi, symmetric air entry Heart - normal rate, regular rhythm, normal S1, S2, no murmurs, rubs, clicks or gallops Abdomen - soft, nontender, nondistended, no masses or organomegaly Neurological - alert, oriented x 3, no nystagmus, no cranial nerve deficit, strength 5/5 in extremities x 4, sensation intact Extremities - peripheral pulses normal, no pedal edema, no clubbing or cyanosis Skin - normal coloration and turgor, no rashes  ED Course  Procedures (including critical care time) Labs Review Labs Reviewed  CBC WITH DIFFERENTIAL - Abnormal; Notable for the following:    MCHC 36.4 (*)    Monocytes Relative 13 (*)    All other components within normal limits  COMPREHENSIVE METABOLIC PANEL - Abnormal; Notable for the following:    Total Protein 8.7 (*)    GFR calc non Af Amer 81 (*)    All other components within normal limits  URINALYSIS, ROUTINE W REFLEX MICROSCOPIC  TROPONIN I  CBG MONITORING, ED    Imaging Review No results found.   EKG Interpretation   Date/Time:  Thursday April 13 2014 15:33:14 EST Ventricular Rate:  79 PR Interval:  150 QRS Duration: 106 QT Interval:  354 QTC Calculation: 405 R Axis:   79 Text Interpretation:  Normal sinus rhythm with sinus arrhythmia Moderate  voltage criteria for LVH, may be normal variant T wave abnormality,  consider inferolateral ischemia Abnormal ECG Since previous tracing t wave  inversions are new Confirmed by Karma GanjaLINKER  MD, MARTHA [phone]([id number]) on 04/13/2014  3:35:57 PM      MDM   Final diagnoses:  Lightheadedness    Pt presenting with c/o  feeling lightheaded after eating.  Symptoms have been ongoing for the past several weeks.  Normal neuro exam.  EKG and labs reassuring.  No vertigo.  Blood pressure reasonably well controlled on current meds. Doubt CVA, arrythmia or other acute emergent process. Pt advised to have followup with his PMD.  Discharged with strict return precautions.  Pt agreeable with plan.  Nursing notes including past medical history and social history reviewed and considered in documentation     Ethelda ChickMartha K Linker, MD 04/14/14 [phone]

## 2014-04-13 NOTE — Discharge Instructions (Signed)
Return to the ED with any concerns including chest pain, difficulty breathing, fainting, leg swelling, vomiting and not able to keep down liquids, decreased level of alertness/lethargy, or any other alarming symptoms °

## 2014-04-17 ENCOUNTER — Telehealth: Payer: Self-pay | Admitting: Physician Assistant

## 2014-04-17 NOTE — Telephone Encounter (Signed)
Please schedule patient for ER follow-up ASAP this week. Thank you.         ----- Message -----     From: SYSTEM     Sent: 04/13/2014  6:23 PM      To: Waldon MerlWilliam C Martin, PA-C      Left message for patient to return my call.

## 2014-04-21 HISTORY — PX: HEMORRHOID BANDING: SHX5850

## 2014-05-01 ENCOUNTER — Emergency Department (HOSPITAL_BASED_OUTPATIENT_CLINIC_OR_DEPARTMENT_OTHER)
Admission: EM | Admit: 2014-05-01 | Discharge: 2014-05-01 | Disposition: A | Discharge status: Home / Self Care | Payer: Managed Care, Other (non HMO) | Attending: Emergency Medicine | Admitting: Emergency Medicine

## 2014-05-01 ENCOUNTER — Encounter (HOSPITAL_BASED_OUTPATIENT_CLINIC_OR_DEPARTMENT_OTHER): Payer: Self-pay

## 2014-05-01 DIAGNOSIS — Z79899 Other long term (current) drug therapy: Secondary | ICD-10-CM | POA: Insufficient documentation

## 2014-05-01 DIAGNOSIS — I10 Essential (primary) hypertension: Secondary | ICD-10-CM | POA: Diagnosis not present

## 2014-05-01 DIAGNOSIS — K219 Gastro-esophageal reflux disease without esophagitis: Secondary | ICD-10-CM | POA: Insufficient documentation

## 2014-05-01 DIAGNOSIS — R42 Dizziness and giddiness: Secondary | ICD-10-CM | POA: Diagnosis present

## 2014-05-01 NOTE — ED Provider Notes (Signed)
CSN: [id number]     Arrival date & time 05/01/14  4098 History   First MD Initiated Contact with Patient 05/01/14 [id number]     Chief Complaint  Patient presents with  . Dizziness    John Simmons is a 39 y.o. male with a history of hypertension and GERD who presents to the emergency department complaining of lightheadedness for the past month and a half. The patient's only complaint is feeling lightheaded and he is unsure of what makes it worse or better. Patient is not lightheaded with position change. The patient was seen in the emergency department on 04/13/2014 for the same with a negative workup. Patient reports his symptoms have not changed since he was last seen. His symptoms are no worse or no better, he just reports ongoing symptoms. Patient has been unable to follow-up with his primary care provider because he could not get off work. The patient denies room spinning or positional lightheadedness. The patient takes blood pressure medication and reports his symptoms started prior to him starting blood pressure medication. The patient denies fevers, chills, changes his corrugation or balance, numbness, tingling, weakness, and all pain, nausea, vomiting, diarrhea, chest pain, shortness of breath, palpitations, or changes to his medications.  (Consider location/radiation/quality/duration/timing/severity/associated sxs/prior Treatment) HPI  Past Medical History  Diagnosis Date  . Hypertension   . GERD (gastroesophageal reflux disease)    Past Surgical History  Procedure Laterality Date  . Negative     No family history on file. History  Substance Use Topics  . Smoking status: Never Smoker   . Smokeless tobacco: Never Used  . Alcohol Use: 0.0 oz/week    0 Not specified per week     Comment: social    Review of Systems  Constitutional: Negative for fever and chills.  HENT: Negative for congestion, ear pain, hearing loss, postnasal drip, rhinorrhea, sinus pressure, sneezing, sore  throat, tinnitus and trouble swallowing.   Eyes: Negative for pain and visual disturbance.  Respiratory: Negative for cough, shortness of breath and wheezing.   Cardiovascular: Negative for chest pain, palpitations and leg swelling.  Gastrointestinal: Negative for nausea, vomiting, abdominal pain and diarrhea.  Genitourinary: Negative for dysuria and hematuria.  Musculoskeletal: Negative for back pain, neck pain and neck stiffness.  Skin: Negative for rash and wound.  Neurological: Positive for light-headedness. Negative for dizziness, seizures, syncope, weakness, numbness and headaches.  All other systems reviewed and are negative.     Allergies  Review of patient's allergies indicates no known allergies.  Home Medications   Prior to Admission medications   Medication Sig Start Date End Date Taking? Authorizing Provider  lisinopril-hydrochlorothiazide (ZESTORETIC) 20-12.5 MG per tablet Take 1 tablet by mouth daily. 03/08/14  Yes Waldon Merl, PA-C  esomeprazole (NEXIUM) 20 MG capsule Take 2 capsules (40 mg total) by mouth every morning. 11/02/13   Juliet Rude. Pickering, MD   BP 147/94 mmHg  Pulse 96  Temp(Src) 98.4 F (36.9 C) (Oral)  Resp 18  Ht  (1.753 m)  Wt 220 lb (99.791 kg)  BMI 32.47 kg/m2  SpO2 99% Physical Exam  Constitutional: He is oriented to person, place, and time. He appears well-developed and well-nourished. No distress.  HENT:  Head: Normocephalic and atraumatic.  Right Ear: External ear normal.  Left Ear: External ear normal.  Nose: Nose normal.  Mouth/Throat: Oropharynx is clear and moist. No oropharyngeal exudate.  Bilateral tympanic membranes are pearly-gray without erythema or loss of landmarks.   Eyes: Conjunctivae and  EOM are normal. Pupils are equal, round, and reactive to light. Right eye exhibits no discharge. Left eye exhibits no discharge.  Neck: Normal range of motion. Neck supple.  Cardiovascular: Normal rate, regular rhythm, normal  heart sounds and intact distal pulses.  Exam reveals no gallop and no friction rub.   No murmur heard. Bilateral radial pulses are intact.  Pulmonary/Chest: Effort normal and breath sounds normal. No respiratory distress. He has no wheezes. He has no rales.  Abdominal: Soft. Bowel sounds are normal. He exhibits no distension and no mass. There is no tenderness. There is no rebound and no guarding.  Musculoskeletal: He exhibits no edema.  Patient is able to ambulate in the room without difficulty or assistance. Patient's strength is 5 out of 5 in his bilateral upper and lower extremities.  Lymphadenopathy:    He has no cervical adenopathy.  Neurological: He is alert and oriented to person, place, and time. No cranial nerve deficit. Coordination normal.  Cranial nerves II through XII are intact bilaterally. No pronator drift. Patient's extraocular intact bilaterally. Finger-to-nose intact bilaterally. Rapid alternating movements intact bilaterally.  Skin: Skin is warm and dry. No rash noted. He is not diaphoretic. No erythema. No pallor.  Psychiatric: He has a normal mood and affect. His behavior is normal.  Nursing note and vitals reviewed.   ED Course  Procedures (including critical care time) Labs Review Labs Reviewed - No data to display  Imaging Review No results found.   EKG Interpretation   Date/Time:  Monday May 01 2014 10:23:26 EST Ventricular Rate:  86 PR Interval:  142 QRS Duration: 98 QT Interval:  340 QTC Calculation: 406 R Axis:   84 Text Interpretation:  Normal sinus rhythm with sinus arrhythmia Left  ventricular hypertrophy with repolarization abnormality ST elevation,  consider early repolarization, pericarditis, or injury No significant  change since last tracing Confirmed by [facility]  MD, WHITNEY ([id number]) on  05/01/2014 10:26:51 AM      Filed Vitals:   05/01/14 6045 05/01/14 0950 05/01/14 1114  BP: 156/100  147/94  Pulse: 90  96  Temp:  98.4 F (36.9  C)   TempSrc: Oral Oral   Resp: 16  18  Height:  (1.753 m)    Weight: 220 lb (99.791 kg)    SpO2: 99%       MDM   Final diagnoses:  Lightheadedness   This is a 39 year old male who presents the emergency department complaining of lightheadedness ongoing for the past month and a half. The patient was seen in the emergency department on 04/13/2014 she had a negative workup at that time. The patient was referred back to his primary care physician however he has not followed up with him. The patient denies any changes to his symptoms or worsening symptoms just continuing symptoms. The patient denies positional lightheadedness. The patient denies room spinning dizziness. The patient reports his symptoms started prior to starting blood pressure medicine. On my exam the patient's blood pressure was 134/91. Patient denies any changes to his medications. The patient denies fevers or chills. The patient is afebrile and nontoxic-appearing. The patient has no neuro deficits. The patient's EKG indicates a normal sinus rhythm with sinus arrhythmia and no significant change from last tracing. The patient is refusing any blood work at this time. Patient reports he has a follow-up appointment with his primary care provider this Friday. I advised the patient to keep this follow-up appointment. I advised patient return to the emergency department with  new or worsening symptoms or new concerns. The patient verbalized understanding and agreement with plan.  This patient was discussed with Dr. Anitra LauthPlunkett who agrees with assessment and plan.     Lawana ChambersWilliam Duncan Melvinia Ashby, PA-C 05/01/14 1631  Gwyneth SproutWhitney Plunkett, MD 05/02/14 [phone]

## 2014-05-01 NOTE — ED Notes (Signed)
Dizziness since December 24.  Has been evaluated in this ED and states he hasn't gotten better. Dizziness is described as lightheadedness; denies headache or head injury.

## 2014-05-01 NOTE — Discharge Instructions (Signed)

## 2014-05-01 NOTE — ED Notes (Addendum)
Pt refusing blood work. He sts his arm still hurts from his last visit. PA made aware.

## 2014-05-12 ENCOUNTER — Ambulatory Visit: Payer: Managed Care, Other (non HMO) | Admitting: Physician Assistant

## 2014-05-22 ENCOUNTER — Telehealth: Payer: Self-pay | Admitting: *Deleted

## 2014-05-22 ENCOUNTER — Ambulatory Visit: Payer: Managed Care, Other (non HMO) | Admitting: Physician Assistant

## 2014-05-22 NOTE — Telephone Encounter (Signed)
No charge.  [facility] needing to cancel appointment.

## 2014-05-22 NOTE — Telephone Encounter (Signed)
Pt cancelled appointment 05/22/14 at 4:00pm day of appointment at 2:11pm for "cpe/last unknown/ss - *confirmed 05/19/14*.  Charge no show fee?

## 2014-05-23 NOTE — Telephone Encounter (Signed)
See note below

## 2014-06-27 ENCOUNTER — Telehealth: Payer: Self-pay | Admitting: Physician Assistant

## 2014-06-28 NOTE — Telephone Encounter (Signed)
Patient states that he lost his job and does not have any insurance and wants to know if Selena BattenCody can fill this?

## 2014-06-28 NOTE — Telephone Encounter (Signed)
Patient established care 03/08/14.  Please advise. eal

## 2014-06-28 NOTE — Telephone Encounter (Signed)
One month sent in to pharmacy

## 2014-06-28 NOTE — Telephone Encounter (Signed)
Left message for patient to return my call.

## 2014-06-28 NOTE — Telephone Encounter (Signed)
Rx request to pharmacy/SLS Requested drug refills are authorized, however, the patient needs further evaluation and/or laboratory testing before further refills are given. Ask him to make an appointment for this.  Please call patient and arrange Physical with fasting labs appointment/SLS Thanks.

## 2014-06-28 NOTE — Telephone Encounter (Signed)
Patient states that he does start a new job later this month and will have insurance a little after that.

## 2014-08-11 MED ORDER — LISINOPRIL-HYDROCHLOROTHIAZIDE 20-12.5 MG PO TABS: 1.0000 | ORAL_TABLET | Freq: Every day | ORAL | Status: DC

## 2014-08-11 NOTE — Addendum Note (Signed)
Addended by: Regis BillSCATES, Roshanna Cimino L on: 08/11/2014 12:28 PM   Modules accepted: Orders

## 2014-08-11 NOTE — Telephone Encounter (Signed)
Left message for patient to return my call.

## 2014-08-11 NOTE — Telephone Encounter (Signed)
Requested drug refills are authorized, however, the patient needs further evaluation and/or laboratory testing before further refills are given. Ask him to make an appointment for this. **MUST HAVE APPOINTMENT FOR FUTURE REFILLS**

## 2014-08-11 NOTE — Telephone Encounter (Signed)
Patient states that his insurance has not kicked in yet and is requesting another refill. [facility] on [address] in [place].   Best # [phone]

## 2014-08-14 NOTE — Telephone Encounter (Signed)
Left message for patient to return my call.

## 2014-09-10 ENCOUNTER — Emergency Department (HOSPITAL_BASED_OUTPATIENT_CLINIC_OR_DEPARTMENT_OTHER)
Admission: EM | Admit: 2014-09-10 | Discharge: 2014-09-10 | Disposition: A | Discharge status: Home / Self Care | Payer: Managed Care, Other (non HMO) | Attending: Emergency Medicine | Admitting: Emergency Medicine

## 2014-09-10 ENCOUNTER — Encounter (HOSPITAL_BASED_OUTPATIENT_CLINIC_OR_DEPARTMENT_OTHER): Payer: Self-pay | Admitting: Emergency Medicine

## 2014-09-10 DIAGNOSIS — K219 Gastro-esophageal reflux disease without esophagitis: Secondary | ICD-10-CM | POA: Insufficient documentation

## 2014-09-10 DIAGNOSIS — Z79899 Other long term (current) drug therapy: Secondary | ICD-10-CM | POA: Insufficient documentation

## 2014-09-10 DIAGNOSIS — I10 Essential (primary) hypertension: Secondary | ICD-10-CM | POA: Insufficient documentation

## 2014-09-10 MED ORDER — LISINOPRIL-HYDROCHLOROTHIAZIDE 20-12.5 MG PO TABS: 1.0000 | ORAL_TABLET | Freq: Every day | ORAL | Status: DC

## 2014-09-10 NOTE — ED Notes (Signed)
Pt states feels nauseous and has not taken his BP medication in 4 days.

## 2014-09-10 NOTE — Discharge Instructions (Signed)

## 2014-09-10 NOTE — ED Provider Notes (Signed)
CSN: [id number]     Arrival date & time 09/10/14  1705 History  5:35 PM   Chief Complaint  Patient presents with  . Hypertension   Patient is a 39 y.o. male presenting with hypertension. The history is provided by the patient. No language interpreter was used.  Hypertension This is a new problem. The current episode started more than 2 days ago. The problem occurs constantly. The problem has not changed since onset.Associated symptoms include headaches. Pertinent negatives include no chest pain, no abdominal pain and no shortness of breath. Nothing aggravates the symptoms. Nothing relieves the symptoms. He has tried nothing for the symptoms.   HPI Comments: John Simmons is a 39 y.o. male with a PMHx of hypertension, who presents to the Emergency Department complaining of moderate nausea and headache due to running out of BP medication 4 days ago and has not been able to obtain a refill. He denies associated vision disturbances chest pain, SOB, abdominal pain, difficulty speaking, or dysuria. Denies hx of cardiac problems. No h/o stroke  Past Medical History  Diagnosis Date  . Hypertension   . GERD (gastroesophageal reflux disease)   . Hypertension    Past Surgical History  Procedure Laterality Date  . Negative     No family history on file. History  Substance Use Topics  . Smoking status: Never Smoker   . Smokeless tobacco: Never Used  . Alcohol Use: 0.0 oz/week    0 Standard drinks or equivalent per week     Comment: social   Review of Systems  Eyes: Negative for visual disturbance.  Respiratory: Negative for shortness of breath.   Cardiovascular: Negative for chest pain.  Gastrointestinal: Positive for nausea. Negative for abdominal pain.  Neurological: Positive for headaches.  All other systems reviewed and are negative.  Allergies  Review of patient's allergies indicates no known allergies.  Home Medications   Prior to Admission medications   Medication Sig Start  Date End Date Taking? Authorizing Provider  esomeprazole (NEXIUM) 20 MG capsule Take 2 capsules (40 mg total) by mouth every morning. 11/02/13   Benjiman CoreNathan Pickering, MD  lisinopril-hydrochlorothiazide (PRINZIDE,ZESTORETIC) 20-12.5 MG per tablet Take 1 tablet by mouth daily. **MUST HAVE APPOINTMENT PRIOR TO FUTURE REFILLS** 08/11/14   Waldon MerlWilliam C Martin, PA-C   Triage Vitals: BP 134/87 mmHg  Pulse 91  Temp(Src) 98 F (36.7 C) (Oral)  Resp 18  Ht 5\' 9"  (1.753 m)  Wt 225 lb (102.059 kg)  BMI 33.21 kg/m2  SpO2 98%  Physical Exam  CONSTITUTIONAL: Well developed/well nourished HEAD: Normocephalic/atraumatic EYES: EOMI/PERRL, no nystagmus, no ptosis, ENMT: Mucous membranes moist NECK: supple no meningeal signs, no bruits SPINE/BACK:entire spine nontender CV: S1/S2 noted, no murmurs/rubs/gallops noted LUNGS: Lungs are clear to auscultation bilaterally, no apparent distress ABDOMEN: soft, nontender, no rebound or guarding GU:no cva tenderness NEURO:Awake/alert, facies symmetric, no arm or leg drift is noted Equal 5/5 strength with shoulder abduction, elbow flex/extension, wrist flex/extension in upper extremities and equal hand grips bilaterally Equal 5/5 strength with hip flexion,knee flex/extension, foot dorsi/plantar flexion Cranial nerves 3/4/5/6/10/27/08/11/12 tested and intact Gait normal without ataxia No past pointing Sensation to light touch intact in all extremities EXTREMITIES: pulses normal, full ROM SKIN: warm, color normal PSYCH: no abnormalities of mood noted, alert and oriented to situation  ED Course  Procedures (including critical care time)  DIAGNOSTIC STUDIES: Oxygen Saturation is 98% on RA, normal by my interpretation.    COORDINATION OF CARE: 5:39 PM Discussed plans to give patient  refill prescription of Lisinopril to last him until he is seen by his PCP. Discussed treatment plan with pt at bedside and pt agreed to plan.  Pt well appearing No distress noted Refill  for meds given No signs of ACS/CVA at this time  Advised need for PCP followup MDM   Final diagnoses:  Essential hypertension    Nursing notes including past medical history and social history reviewed and considered in documentation   I personally performed the services described in this documentation, which was scribed in my presence. The recorded information has been reviewed and is accurate.     Zadie Rhine, MD 09/10/14 1758

## 2014-10-10 ENCOUNTER — Emergency Department (HOSPITAL_BASED_OUTPATIENT_CLINIC_OR_DEPARTMENT_OTHER)
Admission: EM | Admit: 2014-10-10 | Discharge: 2014-10-10 | Disposition: A | Discharge status: Home / Self Care | Payer: Managed Care, Other (non HMO) | Attending: Emergency Medicine | Admitting: Emergency Medicine

## 2014-10-10 ENCOUNTER — Encounter (HOSPITAL_BASED_OUTPATIENT_CLINIC_OR_DEPARTMENT_OTHER): Payer: Self-pay | Admitting: Emergency Medicine

## 2014-10-10 DIAGNOSIS — Z79899 Other long term (current) drug therapy: Secondary | ICD-10-CM | POA: Insufficient documentation

## 2014-10-10 DIAGNOSIS — K219 Gastro-esophageal reflux disease without esophagitis: Secondary | ICD-10-CM | POA: Insufficient documentation

## 2014-10-10 DIAGNOSIS — I10 Essential (primary) hypertension: Secondary | ICD-10-CM

## 2014-10-10 MED ORDER — LISINOPRIL-HYDROCHLOROTHIAZIDE 20-12.5 MG PO TABS: 1.0000 | ORAL_TABLET | Freq: Every day | ORAL | Status: DC

## 2014-10-10 NOTE — ED Provider Notes (Signed)
CSN: [id number]     Arrival date & time 10/10/14  1704 History   First MD Initiated Contact with Patient 10/10/14 1726     Chief Complaint  Patient presents with  . Medication Refill      HPI  She presents for evaluation with a request for a medication refill. He states that he lost his insurance. Will not have it back today for a doctor's visit until the end of July. He states that he is out of his blood pressure medication. He has a concerning family history and states he is worried about waiting that long to be back on his medications. He has no physical complaints. No difficulty with tolerance to the medications no history of angioedema. He had normal labs including normal creatinine in December. No lower extremity edema. No headaches chest pain shortness of breath normal urine output.  Past Medical History  Diagnosis Date  . Hypertension   . GERD (gastroesophageal reflux disease)   . Hypertension    Past Surgical History  Procedure Laterality Date  . Negative     History reviewed. No pertinent family history. History  Substance Use Topics  . Smoking status: Never Smoker   . Smokeless tobacco: Never Used  . Alcohol Use: 0.0 oz/week    0 Standard drinks or equivalent per week     Comment: social    Review of Systems  Constitutional: Negative for fever, chills, diaphoresis, appetite change and fatigue.  HENT: Negative for mouth sores, sore throat and trouble swallowing.   Eyes: Negative for visual disturbance.  Respiratory: Negative for cough, chest tightness, shortness of breath and wheezing.   Cardiovascular: Negative for chest pain.  Gastrointestinal: Negative for nausea, vomiting, abdominal pain, diarrhea and abdominal distention.  Endocrine: Negative for polydipsia, polyphagia and polyuria.  Genitourinary: Negative for dysuria, frequency and hematuria.  Musculoskeletal: Negative for gait problem.  Skin: Negative for color change, pallor and rash.  Neurological:  Negative for dizziness, syncope, light-headedness and headaches.  Hematological: Does not bruise/bleed easily.  Psychiatric/Behavioral: Negative for behavioral problems and confusion.      Allergies  Review of patient's allergies indicates no known allergies.  Home Medications   Prior to Admission medications   Medication Sig Start Date End Date Taking? Authorizing Provider  esomeprazole (NEXIUM) 20 MG capsule Take 2 capsules (40 mg total) by mouth every morning. 11/02/13   Benjiman Core, MD  lisinopril-hydrochlorothiazide (PRINZIDE) 20-12.5 MG per tablet Take 1 tablet by mouth daily. 10/10/14   Rolland Porter, MD   BP 121/86 mmHg  Pulse 72  Temp(Src) 98.6 F (37 C) (Oral)  Resp 12  Ht 5\' 9"  (1.753 m)  Wt 220 lb (99.791 kg)  BMI 32.47 kg/m2  SpO2 100% Physical Exam  Constitutional: He is oriented to person, place, and time. He appears well-developed and well-nourished. No distress.  HENT:  Head: Normocephalic.  Eyes: Conjunctivae are normal. Pupils are equal, round, and reactive to light. No scleral icterus.  Neck: Normal range of motion. Neck supple. No thyromegaly present.  Cardiovascular: Normal rate and regular rhythm.  Exam reveals no gallop and no friction rub.   No murmur heard. Pulmonary/Chest: Effort normal and breath sounds normal. No respiratory distress. He has no wheezes. He has no rales.  Abdominal: Soft. Bowel sounds are normal. He exhibits no distension. There is no tenderness. There is no rebound.  Musculoskeletal: Normal range of motion.  Neurological: He is alert and oriented to person, place, and time.  Skin: Skin is warm and  dry. No rash noted.  Psychiatric: He has a normal mood and affect. His behavior is normal.    ED Course  Procedures (including critical care time) Labs Review Labs Reviewed - No data to display  Imaging Review No results found.   EKG Interpretation None      MDM   Final diagnoses:  Essential hypertension    Patient is  given a refill for the next 6 weeks on lisinopril 20 mg, hydrochlorothiazide 12.5. Encouraged exercise daily, limit his salt and fatty food intake and follow-up with his primary care physician.    Rolland Porter, MD 10/10/14 [phone]

## 2014-10-10 NOTE — ED Notes (Signed)
Pt in requesting refill for hypertensive meds, states he ran out, his primary won't see him until his insurance starts, and BP will spike if he is off of it.

## 2014-10-10 NOTE — Discharge Instructions (Signed)
Limit your salt intake, avoid fat, daily exercise, primary care follow-up.   Hypertension Hypertension, commonly called high blood pressure, is when the force of blood pumping through your arteries is too strong. Your arteries are the blood vessels that carry blood from your heart throughout your body. A blood pressure reading consists of a higher number over a lower number, such as 110/72. The higher number (systolic) is the pressure inside your arteries when your heart pumps. The lower number (diastolic) is the pressure inside your arteries when your heart relaxes. Ideally you want your blood pressure below 120/80. Hypertension forces your heart to work harder to pump blood. Your arteries may become narrow or stiff. Having hypertension puts you at risk for heart disease, stroke, and other problems.  RISK FACTORS Some risk factors for high blood pressure are controllable. Others are not.  Risk factors you cannot control include:   Race. You may be at higher risk if you are African American.  Age. Risk increases with age.  Gender. Men are at higher risk than women before age 30 years. After age 60, women are at higher risk than men. Risk factors you can control include:  Not getting enough exercise or physical activity.  Being overweight.  Getting too much fat, sugar, calories, or salt in your diet.  Drinking too much alcohol. SIGNS AND SYMPTOMS Hypertension does not usually cause signs or symptoms. Extremely high blood pressure (hypertensive crisis) may cause headache, anxiety, shortness of breath, and nosebleed. DIAGNOSIS  To check if you have hypertension, your health care provider will measure your blood pressure while you are seated, with your arm held at the level of your heart. It should be measured at least twice using the same arm. Certain conditions can cause a difference in blood pressure between your right and left arms. A blood pressure reading that is higher than normal on  one occasion does not mean that you need treatment. If one blood pressure reading is high, ask your health care provider about having it checked again. TREATMENT  Treating high blood pressure includes making lifestyle changes and possibly taking medicine. Living a healthy lifestyle can help lower high blood pressure. You may need to change some of your habits. Lifestyle changes may include:  Following the DASH diet. This diet is high in fruits, vegetables, and whole grains. It is low in salt, red meat, and added sugars.  Getting at least 2 hours of brisk physical activity every week.  Losing weight if necessary.  Not smoking.  Limiting alcoholic beverages.  Learning ways to reduce stress. If lifestyle changes are not enough to get your blood pressure under control, your health care provider may prescribe medicine. You may need to take more than one. Work closely with your health care provider to understand the risks and benefits. HOME CARE INSTRUCTIONS  Have your blood pressure rechecked as directed by your health care provider.   Take medicines only as directed by your health care provider. Follow the directions carefully. Blood pressure medicines must be taken as prescribed. The medicine does not work as well when you skip doses. Skipping doses also puts you at risk for problems.   Do not smoke.   Monitor your blood pressure at home as directed by your health care provider. SEEK MEDICAL CARE IF:   You think you are having a reaction to medicines taken.  You have recurrent headaches or feel dizzy.  You have swelling in your ankles.  You have trouble with your vision.  SEEK IMMEDIATE MEDICAL CARE IF:  You develop a severe headache or confusion.  You have unusual weakness, numbness, or feel faint.  You have severe chest or abdominal pain.  You vomit repeatedly.  You have trouble breathing. MAKE SURE YOU:   Understand these instructions.  Will watch your  condition.  Will get help right away if you are not doing well or get worse. Document Released: 04/07/2005 Document Revised: 08/22/2013 Document Reviewed: 01/28/2013 [facility] Patient Information 2015 [place], [place]. This information is not intended to replace advice given to you by your health care provider. Make sure you discuss any questions you have with your health care provider.

## 2014-10-22 ENCOUNTER — Encounter (HOSPITAL_BASED_OUTPATIENT_CLINIC_OR_DEPARTMENT_OTHER): Payer: Self-pay | Admitting: *Deleted

## 2014-10-22 ENCOUNTER — Emergency Department (HOSPITAL_BASED_OUTPATIENT_CLINIC_OR_DEPARTMENT_OTHER)
Admission: EM | Admit: 2014-10-22 | Discharge: 2014-10-22 | Disposition: A | Discharge status: Home / Self Care | Payer: Managed Care, Other (non HMO) | Attending: Emergency Medicine | Admitting: Emergency Medicine

## 2014-10-22 DIAGNOSIS — K219 Gastro-esophageal reflux disease without esophagitis: Secondary | ICD-10-CM | POA: Insufficient documentation

## 2014-10-22 DIAGNOSIS — I10 Essential (primary) hypertension: Secondary | ICD-10-CM | POA: Insufficient documentation

## 2014-10-22 DIAGNOSIS — Z79899 Other long term (current) drug therapy: Secondary | ICD-10-CM | POA: Insufficient documentation

## 2014-10-22 DIAGNOSIS — L03032 Cellulitis of left toe: Secondary | ICD-10-CM | POA: Insufficient documentation

## 2014-10-22 MED ORDER — HYDROCODONE-ACETAMINOPHEN 5-325 MG PO TABS: 2.0000 | ORAL_TABLET | ORAL | Status: DC | PRN

## 2014-10-22 MED ORDER — KETOROLAC TROMETHAMINE 30 MG/ML IJ SOLN
30.0000 mg | Freq: Once | INTRAMUSCULAR | Status: AC
  Administered 2014-10-22: 30 mg via INTRAVENOUS
  Filled 2014-10-22: qty 1

## 2014-10-22 MED ORDER — CEFTRIAXONE SODIUM 1 G IJ SOLR
INTRAMUSCULAR | Status: AC
  Filled 2014-10-22: qty 10

## 2014-10-22 MED ORDER — CEFTRIAXONE SODIUM 1 G IJ SOLR
1.0000 g | Freq: Once | INTRAMUSCULAR | Status: AC
  Administered 2014-10-22: 1 g via INTRAVENOUS

## 2014-10-22 MED ORDER — DOXYCYCLINE HYCLATE 100 MG PO CAPS: 100.0000 mg | ORAL_CAPSULE | Freq: Two times a day (BID) | ORAL | Status: DC

## 2014-10-22 MED ORDER — CEPHALEXIN 500 MG PO CAPS: 500.0000 mg | ORAL_CAPSULE | Freq: Four times a day (QID) | ORAL | Status: DC

## 2014-10-22 MED ORDER — LIDOCAINE HCL 2 % IJ SOLN
10.0000 mL | Freq: Once | INTRAMUSCULAR | Status: AC
  Administered 2014-10-22: 20 mg
  Filled 2014-10-22: qty 20

## 2014-10-22 NOTE — ED Notes (Addendum)
Pt reports pain, swelling and redness in the left foot great toe since last week, denies injury

## 2014-10-22 NOTE — ED Provider Notes (Signed)
CSN: [id number]     Arrival date & time 10/22/14  2048 History   First MD Initiated Contact with Patient 10/22/14 2105     Chief Complaint  Patient presents with  . Toe Pain     (Consider location/radiation/quality/duration/timing/severity/associated sxs/prior Treatment) Patient is a 39 y.o. male presenting with toe pain. No language interpreter was used.  Toe Pain This is a new problem. The current episode started in the past 7 days. The problem occurs constantly. The problem has been gradually worsening. Associated symptoms include joint swelling and myalgias. Nothing aggravates the symptoms. The treatment provided moderate relief.    Past Medical History  Diagnosis Date  . Hypertension   . GERD (gastroesophageal reflux disease)   . Hypertension    Past Surgical History  Procedure Laterality Date  . Negative     No family history on file. History  Substance Use Topics  . Smoking status: Never Smoker   . Smokeless tobacco: Never Used  . Alcohol Use: 0.0 oz/week    0 Standard drinks or equivalent per week     Comment: social    Review of Systems  Musculoskeletal: Positive for myalgias and joint swelling.  All other systems reviewed and are negative.     Allergies  Review of patient's allergies indicates no known allergies.  Home Medications   Prior to Admission medications   Medication Sig Start Date End Date Taking? Authorizing Provider  esomeprazole (NEXIUM) 20 MG capsule Take 2 capsules (40 mg total) by mouth every morning. 11/02/13   Benjiman CoreNathan Pickering, MD  lisinopril-hydrochlorothiazide (PRINZIDE) 20-12.5 MG per tablet Take 1 tablet by mouth daily. 10/10/14   Rolland PorterMark James, MD   BP 148/94 mmHg  Pulse 83  Temp(Src) 98.5 F (36.9 C) (Oral)  Resp 20  Ht 5\' 9"  (1.753 m)  Wt 220 lb (99.791 kg)  BMI 32.47 kg/m2  SpO2 100% Physical Exam  Constitutional: He is oriented to person, place, and time. He appears well-developed and well-nourished.  Musculoskeletal: He  exhibits tenderness.  Swollen left 1st toe, erythema, warm to touch  Neurological: He is alert and oriented to person, place, and time. He has normal reflexes.  Skin: Skin is warm. There is erythema.  Psychiatric: He has a normal mood and affect.    ED Course  Procedures (including critical care time) Labs Review Labs Reviewed - No data to display  Imaging Review No results found.   EKG Interpretation None      MDM  Procedure. Local xylo,   I attempted to aspirate, no fluid, no purulent drainage.    Pt given Rocephin Iv.   Rx for doxycycline and keflex, Hydrocodone for pain.  Pt advised to recheck here tomorrow.    Final diagnoses:  Cellulitis of toe, left        Elson AreasLeslie K Sofia, PA-C 10/22/14 2242  Arby BarretteMarcy Pfeiffer, MD 10/22/14 2356

## 2014-10-22 NOTE — Discharge Instructions (Signed)

## 2014-10-23 ENCOUNTER — Emergency Department (HOSPITAL_BASED_OUTPATIENT_CLINIC_OR_DEPARTMENT_OTHER)
Admission: EM | Admit: 2014-10-23 | Discharge: 2014-10-23 | Disposition: A | Discharge status: Home / Self Care | Payer: Managed Care, Other (non HMO) | Attending: Emergency Medicine | Admitting: Emergency Medicine

## 2014-10-23 ENCOUNTER — Encounter (HOSPITAL_BASED_OUTPATIENT_CLINIC_OR_DEPARTMENT_OTHER): Payer: Self-pay | Admitting: *Deleted

## 2014-10-23 DIAGNOSIS — Z79899 Other long term (current) drug therapy: Secondary | ICD-10-CM | POA: Insufficient documentation

## 2014-10-23 DIAGNOSIS — I10 Essential (primary) hypertension: Secondary | ICD-10-CM | POA: Insufficient documentation

## 2014-10-23 DIAGNOSIS — L03012 Cellulitis of left finger: Secondary | ICD-10-CM

## 2014-10-23 DIAGNOSIS — K219 Gastro-esophageal reflux disease without esophagitis: Secondary | ICD-10-CM | POA: Insufficient documentation

## 2014-10-23 DIAGNOSIS — L03032 Cellulitis of left toe: Secondary | ICD-10-CM | POA: Insufficient documentation

## 2014-10-23 DIAGNOSIS — Z792 Long term (current) use of antibiotics: Secondary | ICD-10-CM | POA: Insufficient documentation

## 2014-10-23 MED ORDER — SULFAMETHOXAZOLE-TRIMETHOPRIM 800-160 MG PO TABS: 1.0000 | ORAL_TABLET | Freq: Two times a day (BID) | ORAL | Status: AC

## 2014-10-23 NOTE — ED Provider Notes (Signed)
CSN: [id number]     Arrival date & time 10/23/14  1234 History   First MD Initiated Contact with Patient 10/23/14 1242     Chief Complaint  Patient presents with  . Wound Check     (Consider location/radiation/quality/duration/timing/severity/associated sxs/prior Treatment) HPI Comments: Patient presents to the emergency department for evaluation of toe pain. Patient was seen in the ER yesterday for same. He was given a prescription for doxycycline and Keflex. Patient reports that the doxycycline was too expensive, he only filled the Keflex prescription. He does report that the toe feels slightly less tender, but the redness and swelling has not changed.  Patient is a 39 y.o. male presenting with wound check.  Wound Check    Past Medical History  Diagnosis Date  . Hypertension   . GERD (gastroesophageal reflux disease)   . Hypertension    Past Surgical History  Procedure Laterality Date  . Negative     No family history on file. History  Substance Use Topics  . Smoking status: Never Smoker   . Smokeless tobacco: Never Used  . Alcohol Use: 0.0 oz/week    0 Standard drinks or equivalent per week     Comment: social    Review of Systems  Skin: Positive for color change.  All other systems reviewed and are negative.     Allergies  Review of patient's allergies indicates no known allergies.  Home Medications   Prior to Admission medications   Medication Sig Start Date End Date Taking? Authorizing Provider  cephALEXin (KEFLEX) 500 MG capsule Take 1 capsule (500 mg total) by mouth 4 (four) times daily. 10/22/14   Elson Areas, PA-C  doxycycline (VIBRAMYCIN) 100 MG capsule Take 1 capsule (100 mg total) by mouth 2 (two) times daily. 10/22/14   Elson Areas, PA-C  esomeprazole (NEXIUM) 20 MG capsule Take 2 capsules (40 mg total) by mouth every morning. 11/02/13   Benjiman Core, MD  HYDROcodone-acetaminophen (NORCO/VICODIN) 5-325 MG per tablet Take 2 tablets by mouth every  4 (four) hours as needed. 10/22/14   Elson Areas, PA-C  lisinopril-hydrochlorothiazide (PRINZIDE) 20-12.5 MG per tablet Take 1 tablet by mouth daily. 10/10/14   Rolland Porter, MD   BP 157/109 mmHg  Pulse 80  Temp(Src) 98.3 F (36.8 C) (Oral)  Resp 18  Ht  (1.753 m)  Wt 220 lb (99.791 kg)  BMI 32.47 kg/m2  SpO2 99% Physical Exam  Constitutional: He is oriented to person, place, and time. He appears well-developed and well-nourished. No distress.  HENT:  Head: Normocephalic and atraumatic.  Right Ear: Hearing normal.  Left Ear: Hearing normal.  Nose: Nose normal.  Mouth/Throat: Oropharynx is clear and moist and mucous membranes are normal.  Eyes: Conjunctivae and EOM are normal. Pupils are equal, round, and reactive to light.  Neck: Normal range of motion. Neck supple.  Cardiovascular: Regular rhythm, S1 normal and S2 normal.  Exam reveals no gallop and no friction rub.   No murmur heard. Pulmonary/Chest: Effort normal and breath sounds normal. No respiratory distress. He exhibits no tenderness.  Abdominal: Soft. Normal appearance and bowel sounds are normal. There is no hepatosplenomegaly. There is no tenderness. There is no rebound, no guarding, no tenderness at McBurney's point and negative Murphy's sign. No hernia.  Musculoskeletal: Normal range of motion.  Neurological: He is alert and oriented to person, place, and time. He has normal strength. No cranial nerve deficit or sensory deficit. Coordination normal. GCS eye subscore is 4. GCS  verbal subscore is 5. GCS motor subscore is 6.  Skin: Skin is warm, dry and intact. No rash noted. No cyanosis.  Mild erythema and swelling without fluctuance at the proximal nail fold and medial aspect of distal phalanx of big toe on left foot.  Psychiatric: He has a normal mood and affect. His speech is normal and behavior is normal. Thought content normal.  Nursing note and vitals reviewed.   ED Course  Procedures (including critical care  time) Labs Review Labs Reviewed - No data to display  Imaging Review No results found.   EKG Interpretation None      MDM   Final diagnoses:  None   paronychia  Resents with complaints of continued pain and swelling of the left great toe. There was no known injury. Patient had digital block and aspiration attempted yesterday, no drainage was noted. Patient does not feel like the area is any more swollen than it was yesterday, I suspect that this is early paronychia without any drainable fluid collections. He is not on adequate antibiotic coverage because he did not fill the doxycycline. Will give a prescription for Bactrim, return if symptoms worsen.    Gilda Creasehristopher J Connery Shiffler, MD 10/23/14 1253

## 2014-10-23 NOTE — Discharge Instructions (Signed)
Cellulitis Cellulitis is an infection of the skin and the tissue beneath it. The infected area is usually red and tender. Cellulitis occurs most often in the arms and lower legs.  CAUSES  Cellulitis is caused by bacteria that enter the skin through cracks or cuts in the skin. The most common types of bacteria that cause cellulitis are staphylococci and streptococci. SIGNS AND SYMPTOMS   Redness and warmth.  Swelling.  Tenderness or pain.  Fever. DIAGNOSIS  Your health care provider can usually determine what is wrong based on a physical exam. Blood tests may also be done. TREATMENT  Treatment usually involves taking an antibiotic medicine. HOME CARE INSTRUCTIONS   Take your antibiotic medicine as directed by your health care provider. Finish the antibiotic even if you start to feel better.  Keep the infected arm or leg elevated to reduce swelling.  Apply a warm cloth to the affected area up to 4 times per day to relieve pain.  Take medicines only as directed by your health care provider.  Keep all follow-up visits as directed by your health care provider. SEEK MEDICAL CARE IF:   You notice red streaks coming from the infected area.  Your red area gets larger or turns dark in color.  Your bone or joint underneath the infected area becomes painful after the skin has healed.  Your infection returns in the same area or another area.  You notice a swollen bump in the infected area.  You develop new symptoms.  You have a fever. SEEK IMMEDIATE MEDICAL CARE IF:   You feel very sleepy.  You develop vomiting or diarrhea.  You have a general ill feeling (malaise) with muscle aches and pains. MAKE SURE YOU:   Understand these instructions.  Will watch your condition.  Will get help right away if you are not doing well or get worse. Document Released: 01/15/2005 Document Revised: 08/22/2013 Document Reviewed: 06/23/2011 [employer] Patient Information 2015 [place] [place].  This information is not intended to replace advice given to you by your health care provider. Make sure you discuss any questions you have with your health care provider.  Paronychia Paronychia is an inflammatory reaction involving the folds of the skin surrounding the fingernail. This is commonly caused by an infection in the skin around a nail. The most common cause of paronychia is frequent wetting of the hands (as seen with bartenders, food servers, nurses or others who wet their hands). This makes the skin around the fingernail susceptible to infection by bacteria (germs) or fungus. Other predisposing factors are:  Aggressive manicuring.  Nail biting.  Thumb sucking. The most common cause is a staphylococcal (a type of germ) infection, or a fungal (Candida) infection. When caused by a germ, it usually comes on suddenly with redness, swelling, pus and is often painful. It may get under the nail and form an abscess (collection of pus), or form an abscess around the nail. If the nail itself is infected with a fungus, the treatment is usually prolonged and may require oral medicine for up to one year. Your caregiver will determine the length of time treatment is required. The paronychia caused by bacteria (germs) may largely be avoided by not pulling on hangnails or picking at cuticles. When the infection occurs at the tips of the finger it is called felon. When the cause of paronychia is from the herpes simplex virus (HSV) it is called herpetic whitlow. TREATMENT  When an abscess is present treatment is often incision and drainage.  This means that the abscess must be cut open so the pus can get out. When this is done, the following home care instructions should be followed. °HOME CARE INSTRUCTIONS  °· It is important to keep the affected fingers very dry. Rubber or plastic gloves over cotton gloves should be used whenever the hand must be placed in water. °· Keep wound clean, dry and dressed as  suggested by your caregiver between warm soaks or warm compresses. °· Soak in warm water for fifteen to twenty minutes three to four times per day for bacterial infections. Fungal infections are very difficult to treat, so often require treatment for long periods of time. °· For bacterial (germ) infections take antibiotics (medicine which kill germs) as directed and finish the prescription, even if the problem appears to be solved before the medicine is gone. °· Only take over-the-counter or prescription medicines for pain, discomfort, or fever as directed by your caregiver. °SEEK IMMEDIATE MEDICAL CARE IF: °· You have redness, swelling, or increasing pain in the wound. °· You notice pus coming from the wound. °· You have a fever. °· You notice a bad smell coming from the wound or dressing. °Document Released: 10/01/2000 Document Revised: 06/30/2011 Document Reviewed: 06/02/2008 °[employer]® Patient Information ©2015 [employer]. This information is not intended to replace advice given to you by your health care provider. Make sure you discuss any questions you have with your health care provider. ° °

## 2014-10-23 NOTE — ED Notes (Signed)
Recheck infection to his left great toe. States it looks no worse but no better.

## 2014-10-31 ENCOUNTER — Emergency Department (HOSPITAL_BASED_OUTPATIENT_CLINIC_OR_DEPARTMENT_OTHER)
Admission: EM | Admit: 2014-10-31 | Discharge: 2014-10-31 | Disposition: A | Discharge status: Home / Self Care | Payer: Managed Care, Other (non HMO) | Attending: Emergency Medicine | Admitting: Emergency Medicine

## 2014-10-31 ENCOUNTER — Encounter (HOSPITAL_BASED_OUTPATIENT_CLINIC_OR_DEPARTMENT_OTHER): Payer: Self-pay

## 2014-10-31 DIAGNOSIS — I10 Essential (primary) hypertension: Secondary | ICD-10-CM | POA: Insufficient documentation

## 2014-10-31 DIAGNOSIS — M109 Gout, unspecified: Secondary | ICD-10-CM | POA: Insufficient documentation

## 2014-10-31 DIAGNOSIS — Z792 Long term (current) use of antibiotics: Secondary | ICD-10-CM | POA: Insufficient documentation

## 2014-10-31 DIAGNOSIS — Z79899 Other long term (current) drug therapy: Secondary | ICD-10-CM | POA: Insufficient documentation

## 2014-10-31 DIAGNOSIS — K219 Gastro-esophageal reflux disease without esophagitis: Secondary | ICD-10-CM | POA: Insufficient documentation

## 2014-10-31 MED ORDER — INDOMETHACIN 25 MG PO CAPS: 25.0000 mg | ORAL_CAPSULE | Freq: Three times a day (TID) | ORAL | Status: DC | PRN

## 2014-10-31 MED ORDER — HYDROCODONE-ACETAMINOPHEN 5-325 MG PO TABS: 1.0000 | ORAL_TABLET | Freq: Four times a day (QID) | ORAL | Status: DC | PRN

## 2014-10-31 NOTE — Discharge Instructions (Signed)
Indomethacin as prescribed.  Hydrocodone as prescribed as needed for pain.  Return to the emergency department if symptoms worsen.   Gout Gout is an inflammatory arthritis caused by a buildup of uric acid crystals in the joints. Uric acid is a chemical that is normally present in the blood. When the level of uric acid in the blood is too high it can form crystals that deposit in your joints and tissues. This causes joint redness, soreness, and swelling (inflammation). Repeat attacks are common. Over time, uric acid crystals can form into masses (tophi) near a joint, destroying bone and causing disfigurement. Gout is treatable and often preventable. CAUSES  The disease begins with elevated levels of uric acid in the blood. Uric acid is produced by your body when it breaks down a naturally found substance called purines. Certain foods you eat, such as meats and fish, contain high amounts of purines. Causes of an elevated uric acid level include:  Being passed down from parent to child (heredity).  Diseases that cause increased uric acid production (such as obesity, psoriasis, and certain cancers).  Excessive alcohol use.  Diet, especially diets rich in meat and seafood.  Medicines, including certain cancer-fighting medicines (chemotherapy), water pills (diuretics), and aspirin.  Chronic kidney disease. The kidneys are no longer able to remove uric acid well.  Problems with metabolism. Conditions strongly associated with gout include:  Obesity.  High blood pressure.  High cholesterol.  Diabetes. Not everyone with elevated uric acid levels gets gout. It is not understood why some people get gout and others do not. Surgery, joint injury, and eating too much of certain foods are some of the factors that can lead to gout attacks. SYMPTOMS   An attack of gout comes on quickly. It causes intense pain with redness, swelling, and warmth in a joint.  Fever can occur.  Often, only one  joint is involved. Certain joints are more commonly involved:  Base of the big toe.  Knee.  Ankle.  Wrist.  Finger. Without treatment, an attack usually goes away in a few days to weeks. Between attacks, you usually will not have symptoms, which is different from many other forms of arthritis. DIAGNOSIS  Your caregiver will suspect gout based on your symptoms and exam. In some cases, tests may be recommended. The tests may include:  Blood tests.  Urine tests.  X-rays.  Joint fluid exam. This exam requires a needle to remove fluid from the joint (arthrocentesis). Using a microscope, gout is confirmed when uric acid crystals are seen in the joint fluid. TREATMENT  There are two phases to gout treatment: treating the sudden onset (acute) attack and preventing attacks (prophylaxis).  Treatment of an Acute Attack.  Medicines are used. These include anti-inflammatory medicines or steroid medicines.  An injection of steroid medicine into the affected joint is sometimes necessary.  The painful joint is rested. Movement can worsen the arthritis.  You may use warm or cold treatments on painful joints, depending which works best for you.  Treatment to Prevent Attacks.  If you suffer from frequent gout attacks, your caregiver may advise preventive medicine. These medicines are started after the acute attack subsides. These medicines either help your kidneys eliminate uric acid from your body or decrease your uric acid production. You may need to stay on these medicines for a very long time.  The early phase of treatment with preventive medicine can be associated with an increase in acute gout attacks. For this reason, during the first  few months of treatment, your caregiver may also advise you to take medicines usually used for acute gout treatment. Be sure you understand your caregiver's directions. Your caregiver may make several adjustments to your medicine dose before these medicines  are effective.  Discuss dietary treatment with your caregiver or dietitian. Alcohol and drinks high in sugar and fructose and foods such as meat, poultry, and seafood can increase uric acid levels. Your caregiver or dietitian can advise you on drinks and foods that should be limited. HOME CARE INSTRUCTIONS   Do not take aspirin to relieve pain. This raises uric acid levels.  Only take over-the-counter or prescription medicines for pain, discomfort, or fever as directed by your caregiver.  Rest the joint as much as possible. When in bed, keep sheets and blankets off painful areas.  Keep the affected joint raised (elevated).  Apply warm or cold treatments to painful joints. Use of warm or cold treatments depends on which works best for you.  Use crutches if the painful joint is in your leg.  Drink enough fluids to keep your urine clear or pale yellow. This helps your body get rid of uric acid. Limit alcohol, sugary drinks, and fructose drinks.  Follow your dietary instructions. Pay careful attention to the amount of protein you eat. Your daily diet should emphasize fruits, vegetables, whole grains, and fat-free or low-fat milk products. Discuss the use of coffee, vitamin C, and cherries with your caregiver or dietitian. These may be helpful in lowering uric acid levels.  Maintain a healthy body weight. SEEK MEDICAL CARE IF:   You develop diarrhea, vomiting, or any side effects from medicines.  You do not feel better in 24 hours, or you are getting worse. SEEK IMMEDIATE MEDICAL CARE IF:   Your joint becomes suddenly more tender, and you have chills or a fever. MAKE SURE YOU:   Understand these instructions.  Will watch your condition.  Will get help right away if you are not doing well or get worse. Document Released: 04/04/2000 Document Revised: 08/22/2013 Document Reviewed: 11/19/2011 [facility] Patient Information 2015 [place], [place]. This information is not intended to replace  advice given to you by your health care provider. Make sure you discuss any questions you have with your health care provider.

## 2014-10-31 NOTE — ED Provider Notes (Signed)
CSN: [id number]     Arrival date & time 10/31/14  2112 History   This chart was scribed for Geoffery Lyonsouglas Rashonda Warrior, MD by Abel PrestoKara Demonbreun, ED Scribe. This patient was seen in room MH02/MH02 and the patient's care was started at 9:29 PM.    Chief Complaint  Patient presents with  . Toe Pain     The history is provided by the patient. No language interpreter was used.   HPI Comments: John Simmons is a 39 y.o. male with PMHx of HTN who presents to the Emergency Department complaining of left great toe swelling and pain with onset 10/20/14. Pt reports difficulty ambulating and decreased ROM of toe secondary to pain. Pt has been evaluated for the pain twice since onset, last seen on 10/23/14. Pt has been compliant with taking Bactrim and doxycycline. Pt denies h/o gout. Pt denies known injury, fever, and chills.   Past Medical History  Diagnosis Date  . Hypertension   . GERD (gastroesophageal reflux disease)   . Hypertension    Past Surgical History  Procedure Laterality Date  . Negative     No family history on file. History  Substance Use Topics  . Smoking status: Never Smoker   . Smokeless tobacco: Never Used  . Alcohol Use: Yes     Comment: social    Review of Systems A complete 10 system review of systems was obtained and all systems are negative except as noted in the HPI and PMH.     Allergies  Review of patient's allergies indicates no known allergies.  Home Medications   Prior to Admission medications   Medication Sig Start Date End Date Taking? Authorizing Provider  cephALEXin (KEFLEX) 500 MG capsule Take 1 capsule (500 mg total) by mouth 4 (four) times daily. 10/22/14   Elson AreasLeslie K Sofia, PA-C  doxycycline (VIBRAMYCIN) 100 MG capsule Take 1 capsule (100 mg total) by mouth 2 (two) times daily. 10/22/14   Elson AreasLeslie K Sofia, PA-C  esomeprazole (NEXIUM) 20 MG capsule Take 2 capsules (40 mg total) by mouth every morning. 11/02/13   Benjiman CoreNathan Pickering, MD  HYDROcodone-acetaminophen  (NORCO/VICODIN) 5-325 MG per tablet Take 2 tablets by mouth every 4 (four) hours as needed. 10/22/14   Elson AreasLeslie K Sofia, PA-C  lisinopril-hydrochlorothiazide (PRINZIDE) 20-12.5 MG per tablet Take 1 tablet by mouth daily. 10/10/14   Rolland PorterMark James, MD   BP 142/96 mmHg  Pulse 93  Temp(Src) 98.2 F (36.8 C) (Oral)  Resp 16  Wt 220 lb (99.791 kg)  SpO2 100% Physical Exam  Constitutional: He is oriented to person, place, and time. He appears well-developed and well-nourished.  HENT:  Head: Normocephalic and atraumatic.  Eyes: EOM are normal.  Neck: Normal range of motion.  Cardiovascular: Normal rate, regular rhythm, normal heart sounds and intact distal pulses.   Pulmonary/Chest: Effort normal and breath sounds normal. No respiratory distress.  Abdominal: Soft. He exhibits no distension. There is no tenderness.  Musculoskeletal: Normal range of motion.  Neurological: He is alert and oriented to person, place, and time.  Skin: Skin is warm and dry.  Psychiatric: He has a normal mood and affect. Judgment normal.  Nursing note and vitals reviewed.   ED Course  Procedures (including critical care time) DIAGNOSTIC STUDIES: Oxygen Saturation is 100% on room air, normal by my interpretation.    COORDINATION OF CARE: 9:34 PM Discussed treatment plan with patient at beside, the patient agrees with the plan and has no further questions at this time.   Labs  Review Labs Reviewed - No data to display  Imaging Review No results found.   EKG Interpretation None      MDM   Final diagnoses:  None    Likely gout.  Will treat with indocin, pain meds, prn return.   I personally performed the services described in this documentation, which was scribed in my presence. The recorded information has been reviewed and is accurate.      Geoffery Lyons, MD 11/03/14 [phone]

## 2014-10-31 NOTE — ED Notes (Signed)
Left great toe pain/swelling-seen here twice for same with dx of cellulitis-states he is still taking abx-not taking pain med

## 2014-11-11 ENCOUNTER — Encounter (HOSPITAL_BASED_OUTPATIENT_CLINIC_OR_DEPARTMENT_OTHER): Payer: Self-pay | Admitting: *Deleted

## 2014-11-11 ENCOUNTER — Emergency Department (HOSPITAL_BASED_OUTPATIENT_CLINIC_OR_DEPARTMENT_OTHER)
Admission: EM | Admit: 2014-11-11 | Discharge: 2014-11-11 | Disposition: A | Discharge status: Home / Self Care | Payer: Managed Care, Other (non HMO) | Attending: Emergency Medicine | Admitting: Emergency Medicine

## 2014-11-11 DIAGNOSIS — M109 Gout, unspecified: Secondary | ICD-10-CM

## 2014-11-11 DIAGNOSIS — I10 Essential (primary) hypertension: Secondary | ICD-10-CM | POA: Insufficient documentation

## 2014-11-11 DIAGNOSIS — K219 Gastro-esophageal reflux disease without esophagitis: Secondary | ICD-10-CM | POA: Insufficient documentation

## 2014-11-11 DIAGNOSIS — Z79899 Other long term (current) drug therapy: Secondary | ICD-10-CM | POA: Insufficient documentation

## 2014-11-11 DIAGNOSIS — M10072 Idiopathic gout, left ankle and foot: Secondary | ICD-10-CM | POA: Insufficient documentation

## 2014-11-11 LAB — CREATININE, SERUM
CREATININE: 1.24 mg/dL (ref 0.61–1.24)
GFR calc Af Amer: >60 mL/min (ref >60)

## 2014-11-11 MED ORDER — HYDROCODONE-ACETAMINOPHEN 5-325 MG PO TABS: ORAL_TABLET | ORAL | Status: DC

## 2014-11-11 MED ORDER — PREDNISONE 20 MG PO TABS: 60.0000 mg | ORAL_TABLET | Freq: Every day | ORAL | Status: DC

## 2014-11-11 MED ORDER — LISINOPRIL-HYDROCHLOROTHIAZIDE 20-12.5 MG PO TABS: 1.0000 | ORAL_TABLET | Freq: Every day | ORAL | Status: DC

## 2014-11-11 NOTE — Discharge Instructions (Signed)
Please follow with your primary care doctor in the next 2 days for a check-up. They must obtain records for further management.   Do not hesitate to return to the Emergency Department for any new, worsening or concerning symptoms.   Take vicodin for breakthrough pain, do not drink alcohol, drive, care for children or do other critical tasks while taking vicodin.   Gout Gout is when your joints become red, sore, and swell (inflamed). This is caused by the buildup of uric acid crystals in the joints. Uric acid is a chemical that is normally in the blood. If the level of uric acid gets too high in the blood, these crystals form in your joints and tissues. Over time, these crystals can form into masses near the joints and tissues. These masses can destroy bone and cause the bone to look misshapen (deformed). HOME CARE   Do not take aspirin for pain.  Only take medicine as told by your doctor.  Rest the joint as much as you can. When in bed, keep sheets and blankets off painful areas.  Keep the sore joints raised (elevated).  Put warm or cold packs on painful joints. Use of warm or cold packs depends on which works best for you.  Use crutches if the painful joint is in your leg.  Drink enough fluids to keep your pee (urine) clear or pale yellow. Limit alcohol, sugary drinks, and drinks with fructose in them.  Follow your diet instructions. Pay careful attention to how much protein you eat. Include fruits, vegetables, whole grains, and fat-free or low-fat milk products in your daily diet. Talk to your doctor or dietitian about the use of coffee, vitamin C, and cherries. These may help lower uric acid levels.  Keep a healthy body weight. GET HELP RIGHT AWAY IF:   You have watery poop (diarrhea), throw up (vomit), or have any side effects from medicines.  You do not feel better in 24 hours, or you are getting worse.  Your joint becomes suddenly more tender, and you have chills or a  fever. MAKE SURE YOU:   Understand these instructions.  Will watch your condition.  Will get help right away if you are not doing well or get worse. Document Released: 01/15/2008 Document Revised: 08/22/2013 Document Reviewed: 11/19/2011 [facility] Patient Information 2015 [place], [place]. This information is not intended to replace advice given to you by your health care provider. Make sure you discuss any questions you have with your health care provider.  Hypertension Hypertension, commonly called high blood pressure, is when the force of blood pumping through your arteries is too strong. Your arteries are the blood vessels that carry blood from your heart throughout your body. A blood pressure reading consists of a higher number over a lower number, such as 110/72. The higher number (systolic) is the pressure inside your arteries when your heart pumps. The lower number (diastolic) is the pressure inside your arteries when your heart relaxes. Ideally you want your blood pressure below 120/80. Hypertension forces your heart to work harder to pump blood. Your arteries may become narrow or stiff. Having hypertension puts you at risk for heart disease, stroke, and other problems.  RISK FACTORS Some risk factors for high blood pressure are controllable. Others are not.  Risk factors you cannot control include:   Race. You may be at higher risk if you are African American.  Age. Risk increases with age.  Gender. Men are at higher risk than women before age 27  years. After age 13, women are at higher risk than men. Risk factors you can control include:  Not getting enough exercise or physical activity.  Being overweight.  Getting too much fat, sugar, calories, or salt in your diet.  Drinking too much alcohol. SIGNS AND SYMPTOMS Hypertension does not usually cause signs or symptoms. Extremely high blood pressure (hypertensive crisis) may cause headache, anxiety, shortness of breath, and  nosebleed. DIAGNOSIS  To check if you have hypertension, your health care provider will measure your blood pressure while you are seated, with your arm held at the level of your heart. It should be measured at least twice using the same arm. Certain conditions can cause a difference in blood pressure between your right and left arms. A blood pressure reading that is higher than normal on one occasion does not mean that you need treatment. If one blood pressure reading is high, ask your health care provider about having it checked again. TREATMENT  Treating high blood pressure includes making lifestyle changes and possibly taking medicine. Living a healthy lifestyle can help lower high blood pressure. You may need to change some of your habits. Lifestyle changes may include:  Following the DASH diet. This diet is high in fruits, vegetables, and whole grains. It is low in salt, red meat, and added sugars.  Getting at least 2 hours of brisk physical activity every week.  Losing weight if necessary.  Not smoking.  Limiting alcoholic beverages.  Learning ways to reduce stress. If lifestyle changes are not enough to get your blood pressure under control, your health care provider may prescribe medicine. You may need to take more than one. Work closely with your health care provider to understand the risks and benefits. HOME CARE INSTRUCTIONS  Have your blood pressure rechecked as directed by your health care provider.   Take medicines only as directed by your health care provider. Follow the directions carefully. Blood pressure medicines must be taken as prescribed. The medicine does not work as well when you skip doses. Skipping doses also puts you at risk for problems.   Do not smoke.   Monitor your blood pressure at home as directed by your health care provider. SEEK MEDICAL CARE IF:   You think you are having a reaction to medicines taken.  You have recurrent headaches or feel  dizzy.  You have swelling in your ankles.  You have trouble with your vision. SEEK IMMEDIATE MEDICAL CARE IF:  You develop a severe headache or confusion.  You have unusual weakness, numbness, or feel faint.  You have severe chest or abdominal pain.  You vomit repeatedly.  You have trouble breathing. MAKE SURE YOU:   Understand these instructions.  Will watch your condition.  Will get help right away if you are not doing well or get worse. Document Released: 04/07/2005 Document Revised: 08/22/2013 Document Reviewed: 01/28/2013 [facility] Patient Information 2015 [place], [place]. This information is not intended to replace advice given to you by your health care provider. Make sure you discuss any questions you have with your health care provider.

## 2014-11-11 NOTE — ED Provider Notes (Signed)
CSN: [id number]     Arrival date & time 11/11/14  1208 History   First MD Initiated Contact with Patient 11/11/14 1254     Chief Complaint  Patient presents with  . Medication Refill     (Consider location/radiation/quality/duration/timing/severity/associated sxs/prior Treatment) HPI  Blood pressure 155/106, pulse 89, temperature 99 F (37.2 C), temperature source Oral, resp. rate 18, height  (1.753 m), weight 220 lb (99.791 kg), SpO2 97 %.  John Simmons is a 39 y.o. male questing medication refill of hypertension medications and gout meds. Patient states his been out of his lisinopril hydrochlorothiazide for 2 days now, he also ran out of his indomethacin and states that the left great toe is still painful and swollen. He rates his pain at 8 out of 10, exacerbated by movement and palpation. No pain medication taken prior to arrival. She denies chest pain, shortness of breath, nausea, vomiting, headache, change in vision, abdominal pain, nausea vomiting, hematuria, dysuria. States that he has an appointment at the [facility] but it's not for 2 months.  Past Medical History  Diagnosis Date  . Hypertension   . GERD (gastroesophageal reflux disease)   . Hypertension    Past Surgical History  Procedure Laterality Date  . Negative     History reviewed. No pertinent family history. History  Substance Use Topics  . Smoking status: Never Smoker   . Smokeless tobacco: Never Used  . Alcohol Use: Yes     Comment: social    Review of Systems  10 systems reviewed and found to be negative, except as noted in the HPI.   Allergies  Review of patient's allergies indicates no known allergies.  Home Medications   Prior to Admission medications   Medication Sig Start Date End Date Taking? Authorizing Provider  esomeprazole (NEXIUM) 20 MG capsule Take 2 capsules (40 mg total) by mouth every morning. 11/02/13   Benjiman Core, MD  HYDROcodone-acetaminophen (NORCO) 5-325 MG  per tablet Take 1-2 tablets by mouth every 6 (six) hours as needed. 10/31/14   Geoffery Lyons, MD  indomethacin (INDOCIN) 25 MG capsule Take 1 capsule (25 mg total) by mouth 3 (three) times Simmons as needed. 10/31/14   Geoffery Lyons, MD  lisinopril-hydrochlorothiazide (PRINZIDE) 20-12.5 MG per tablet Take 1 tablet by mouth Simmons. 10/10/14   Rolland Porter, MD   BP 155/106 mmHg  Pulse 89  Temp(Src) 99 F (37.2 C) (Oral)  Resp 18  Ht  (1.753 m)  Wt 220 lb (99.791 kg)  BMI 32.47 kg/m2  SpO2 97% Physical Exam  Constitutional: He is oriented to person, place, and time. He appears well-developed and well-nourished. No distress.  HENT:  Head: Normocephalic and atraumatic.  Mouth/Throat: Oropharynx is clear and moist.  Eyes: Conjunctivae and EOM are normal. Pupils are equal, round, and reactive to light.  Neck: Normal range of motion.  Cardiovascular: Normal rate, regular rhythm and intact distal pulses.   Pulmonary/Chest: Effort normal and breath sounds normal.  Abdominal: Soft. There is no tenderness.  Musculoskeletal: Normal range of motion. He exhibits edema and tenderness.  Swelling and tenderness over interphalangeal joint on left great toe. No significant overlying warmth.   Neurological: He is alert and oriented to person, place, and time.  Skin: He is not diaphoretic.  Psychiatric: He has a normal mood and affect.  Nursing note and vitals reviewed.   ED Course  Procedures (including critical care time) Labs Review Labs Reviewed  CREATININE, SERUM    Imaging Review  No results found.   EKG Interpretation None      MDM   Final diagnoses:  Essential hypertension, benign  Gout of left foot, unspecified cause, unspecified chronicity    Filed Vitals:   11/11/14 1211  BP: 155/106  Pulse: 89  Temp: 99 F (37.2 C)  TempSrc: Oral  Resp: 18  Height: 5\' 9"  (1.753 m)  Weight: 220 lb (99.791 kg)  SpO2: 97%    John Simmons is a pleasant 39 y.o. male questing  medication refill of his lisinopril hydrochlorothiazide and gout medication. [facility] but that's not for 2 months. Patient has been out of his meds for 2 days, he has no signs of end organ damage and no complaints except for the pain in his left great toe. No warmth over the joint, doubt this is a septic arthritis. I would like to check a renal function as the hypertension gout medication all affect the kidney.  Patient has normal renal function however it is on the high end of normal, I will write him a prescription for the lisinopril but will change his gout medications to a prednisone 21 day taper and Vicodin.  Evaluation does not show pathology that would require ongoing emergent intervention or inpatient treatment. Pt is hemodynamically stable and mentating appropriately. Discussed findings and plan with patient/guardian, who agrees with care plan. All questions answered. Return precautions discussed and outpatient follow up given.   New Prescriptions   HYDROCODONE-ACETAMINOPHEN (NORCO/VICODIN) 5-325 MG PER TABLET    Take 1-2 tablets by mouth every 6 hours as needed for pain and/or cough.   PREDNISONE (DELTASONE) 20 MG TABLET    Take 3 tablets (60 mg total) by mouth Simmons. Take 60 mg by mouth Simmons week 1, then 40mg  by mouth Simmons for week 2, then 20mg  Simmons for week 3         Wynetta Emery, PA-C 11/11/14 1422  Doug Sou, MD 11/11/14 1559

## 2014-11-11 NOTE — ED Notes (Signed)
Pt requesting refill on gout and BP meds

## 2014-12-02 ENCOUNTER — Emergency Department (HOSPITAL_BASED_OUTPATIENT_CLINIC_OR_DEPARTMENT_OTHER)
Admission: EM | Admit: 2014-12-02 | Discharge: 2014-12-02 | Disposition: A | Discharge status: Home / Self Care | Payer: Self-pay | Attending: Emergency Medicine | Admitting: Emergency Medicine

## 2014-12-02 ENCOUNTER — Encounter (HOSPITAL_BASED_OUTPATIENT_CLINIC_OR_DEPARTMENT_OTHER): Payer: Self-pay | Admitting: Emergency Medicine

## 2014-12-02 DIAGNOSIS — M109 Gout, unspecified: Secondary | ICD-10-CM | POA: Insufficient documentation

## 2014-12-02 DIAGNOSIS — K219 Gastro-esophageal reflux disease without esophagitis: Secondary | ICD-10-CM | POA: Insufficient documentation

## 2014-12-02 DIAGNOSIS — Z79899 Other long term (current) drug therapy: Secondary | ICD-10-CM | POA: Insufficient documentation

## 2014-12-02 DIAGNOSIS — I10 Essential (primary) hypertension: Secondary | ICD-10-CM | POA: Insufficient documentation

## 2014-12-02 MED ORDER — PREDNISONE 50 MG PO TABS
60.0000 mg | ORAL_TABLET | Freq: Once | ORAL | Status: AC
  Administered 2014-12-02: 60 mg via ORAL
  Filled 2014-12-02 (×2): qty 1

## 2014-12-02 MED ORDER — INDOMETHACIN 25 MG PO CAPS: 25.0000 mg | ORAL_CAPSULE | Freq: Three times a day (TID) | ORAL | Status: DC | PRN

## 2014-12-02 MED ORDER — PREDNISONE 20 MG PO TABS: ORAL_TABLET | ORAL | Status: DC

## 2014-12-02 NOTE — Discharge Instructions (Signed)
Low-Purine Diet  Purines are compounds that affect the level of uric acid in your body. A low-purine diet is a diet that is low in purines. Eating a low-purine diet can prevent the level of uric acid in your body from getting too high and causing gout or kidney stones or both.  WHAT DO I NEED TO KNOW ABOUT THIS DIET?  · Choose low-purine foods. Examples of low-purine foods are listed in the next section.  · Drink plenty of fluids, especially water. Fluids can help remove uric acid from your body. Try to drink 8-16 cups (1.9-3.8 L) a day.  · Limit foods high in fat, especially saturated fat, as fat makes it harder for the body to get rid of uric acid. Foods high in saturated fat include pizza, cheese, ice cream, whole milk, fried foods, and gravies. Choose foods that are lower in fat and lean sources of protein. Use olive oil when cooking as it contains healthy fats that are not high in saturated fat.  · Limit alcohol. Alcohol interferes with the elimination of uric acid from your body. If you are having a gout attack, avoid all alcohol.  · Keep in mind that different people's bodies react differently to different foods. You will probably learn over time which foods do or do not affect you. If you discover that a food tends to cause your gout to flare up, avoid eating that food. You can more freely enjoy foods that do not cause problems. If you have any questions about a food item, talk to your dietitian or health care provider.  WHICH FOODS ARE LOW, MODERATE, AND HIGH IN PURINES?  The following is a list of foods that are low, moderate, and high in purines. You can eat any amount of the foods that are low in purines. You may be able to have small amounts of foods that are moderate in purines. Ask your health care provider how much of a food moderate in purines you can have. Avoid foods high in purines.  Grains  · Foods low in purines: Enriched white bread, pasta, rice, cake, cornbread, popcorn.  · Foods moderate in  purines: Whole-grain breads and cereals, wheat germ, bran, oatmeal. Uncooked oatmeal. Dry wheat bran or wheat germ.  · Foods high in purines: Pancakes, French toast, biscuits, muffins.  Vegetables  · Foods low in purines: All vegetables, except those that are moderate in purines.  · Foods moderate in purines: Asparagus, cauliflower, spinach, mushrooms, green peas.  Fruits  · All fruits are low in purines.  Meats and other Protein Foods  · Foods low in purines: Eggs, nuts, peanut butter.  · Foods moderate in purines: 80-90% lean beef, lamb, veal, pork, poultry, fish, eggs, peanut butter, nuts. Crab, lobster, oysters, and shrimp. Cooked dried beans, peas, and lentils.  · Foods high in purines: Anchovies, sardines, herring, mussels, tuna, codfish, scallops, trout, and haddock. Bacon. Organ meats (such as liver or kidney). Tripe. Game meat. Goose. Sweetbreads.  Dairy  · All dairy foods are low in purines. Low-fat and fat-free dairy products are best because they are low in saturated fat.  Beverages  · Drinks low in purines: Water, carbonated beverages, tea, coffee, cocoa.  · Drinks moderate in purines: Soft drinks and other drinks sweetened with high-fructose corn syrup. Juices. To find whether a food or drink is sweetened with high-fructose corn syrup, look at the ingredients list.  · Drinks high in purines: Alcoholic beverages (such as beer).  Condiments  · Foods   low in purines: Salt, herbs, olives, pickles, relishes, vinegar.  · Foods moderate in purines: Butter, margarine, oils, mayonnaise.  Fats and Oils  · Foods low in purines: All types, except gravies and sauces made with meat.  · Foods high in purines: Gravies and sauces made with meat.  Other Foods  · Foods low in purines: Sugars, sweets, gelatin. Cake. Soups made without meat.  · Foods moderate in purines: Meat-based or fish-based soups, broths, or bouillons. Foods and drinks sweetened with high-fructose corn syrup.  · Foods high in purines: High-fat desserts  (such as ice cream, cookies, cakes, pies, doughnuts, and chocolate).  Contact your dietitian for more information on foods that are not listed here.  Document Released: 08/02/2010 Document Revised: 04/12/2013 Document Reviewed: 03/14/2013  [employer]® Patient Information ©2015 [employer]. This information is not intended to replace advice given to you by your health care provider. Make sure you discuss any questions you have with your health care provider.

## 2014-12-02 NOTE — ED Provider Notes (Signed)
CSN: [id number]     Arrival date & time 12/02/14  0600 History   First MD Initiated Contact with Patient 12/02/14 0606     Chief Complaint  Patient presents with  . Medication Refill     (Consider location/radiation/quality/duration/timing/severity/associated sxs/prior Treatment) Patient is a 39 y.o. male presenting with toe pain. The history is provided by the patient.  Toe Pain This is a recurrent problem. The current episode started yesterday. The problem occurs constantly. The problem has not changed since onset.Pertinent negatives include no chest pain, no abdominal pain, no headaches and no shortness of breath. Nothing aggravates the symptoms. Nothing relieves the symptoms. He has tried nothing for the symptoms. The treatment provided no relief.  Needs a refill on his gout medication.    Past Medical History  Diagnosis Date  . Hypertension   . GERD (gastroesophageal reflux disease)   . Hypertension    Past Surgical History  Procedure Laterality Date  . Negative     History reviewed. No pertinent family history. Social History  Substance Use Topics  . Smoking status: Never Smoker   . Smokeless tobacco: Never Used  . Alcohol Use: Yes     Comment: social    Review of Systems  Respiratory: Negative for shortness of breath.   Cardiovascular: Negative for chest pain.  Gastrointestinal: Negative for abdominal pain.  Neurological: Negative for headaches.  All other systems reviewed and are negative.     Allergies  Review of patient's allergies indicates no known allergies.  Home Medications   Prior to Admission medications   Medication Sig Start Date End Date Taking? Authorizing Provider  esomeprazole (NEXIUM) 20 MG capsule Take 2 capsules (40 mg total) by mouth every morning. 11/02/13   Benjiman Core, MD  HYDROcodone-acetaminophen (NORCO/VICODIN) 5-325 MG per tablet Take 1-2 tablets by mouth every 6 hours as needed for pain and/or cough. 11/11/14   Nicole  Pisciotta, PA-C  indomethacin (INDOCIN) 25 MG capsule Take 1 capsule (25 mg total) by mouth 3 (three) times daily as needed. 10/31/14   Geoffery Lyons, MD  lisinopril-hydrochlorothiazide (PRINZIDE) 20-12.5 MG per tablet Take 1 tablet by mouth daily. 11/11/14   Nicole Pisciotta, PA-C  predniSONE (DELTASONE) 20 MG tablet Take 3 tablets (60 mg total) by mouth daily. Take 60 mg by mouth daily week 1, then 40mg  by mouth daily for week 2, then 20mg  daily for week 3 11/11/14   Joni Reining Pisciotta, PA-C   There were no vitals taken for this visit. Physical Exam  Constitutional: He is oriented to person, place, and time. He appears well-developed and well-nourished. No distress.  HENT:  Head: Normocephalic and atraumatic.  Mouth/Throat: Oropharynx is clear and moist.  Eyes: Conjunctivae are normal. Pupils are equal, round, and reactive to light.  Neck: Normal range of motion. Neck supple.  Cardiovascular: Normal rate, regular rhythm and intact distal pulses.   Pulmonary/Chest: Effort normal and breath sounds normal. He has no wheezes. He has no rales.  Abdominal: Soft. Bowel sounds are normal. There is no tenderness. There is no rebound and no guarding.  Musculoskeletal: Normal range of motion.  Left great toe is swollen and warm without erythema  Neurological: He is alert and oriented to person, place, and time.  Skin: Skin is warm and dry. No rash noted. No erythema.  Psychiatric: He has a normal mood and affect.    ED Course  Procedures (including critical care time) Labs Review Labs Reviewed - No data to display  Imaging Review No results  found. I, Cassandr Cederberg-RASCH,Valery Chance K, personally reviewed and evaluated these images and lab results as part of my medical decision-making.   EKG Interpretation None      MDM   Final diagnoses:  None    Will refill patient's indomethacin and prednisone.  The ED is not the correct placed for follow up.  Patient is instructed to follow up with Dr.  Daphine Deutscher   Cy Blamer, MD 12/02/14 [phone]

## 2014-12-02 NOTE — ED Notes (Signed)
Pt reports hx of gout that was improving on prednisone but he ran out of the medication and now symptoms are returning

## 2014-12-18 ENCOUNTER — Encounter (HOSPITAL_BASED_OUTPATIENT_CLINIC_OR_DEPARTMENT_OTHER): Payer: Self-pay | Admitting: *Deleted

## 2014-12-18 ENCOUNTER — Emergency Department (HOSPITAL_BASED_OUTPATIENT_CLINIC_OR_DEPARTMENT_OTHER)
Admission: EM | Admit: 2014-12-18 | Discharge: 2014-12-18 | Disposition: A | Discharge status: Home / Self Care | Payer: Self-pay | Attending: Emergency Medicine | Admitting: Emergency Medicine

## 2014-12-18 DIAGNOSIS — M109 Gout, unspecified: Secondary | ICD-10-CM | POA: Insufficient documentation

## 2014-12-18 DIAGNOSIS — Z79899 Other long term (current) drug therapy: Secondary | ICD-10-CM | POA: Insufficient documentation

## 2014-12-18 DIAGNOSIS — K219 Gastro-esophageal reflux disease without esophagitis: Secondary | ICD-10-CM | POA: Insufficient documentation

## 2014-12-18 DIAGNOSIS — I10 Essential (primary) hypertension: Secondary | ICD-10-CM | POA: Insufficient documentation

## 2014-12-18 HISTORY — DX: Gout, unspecified: M10.9

## 2014-12-18 MED ORDER — PREDNISONE 50 MG PO TABS
60.0000 mg | ORAL_TABLET | Freq: Once | ORAL | Status: AC
  Administered 2014-12-18: 60 mg via ORAL
  Filled 2014-12-18 (×2): qty 1

## 2014-12-18 MED ORDER — TRAMADOL HCL 50 MG PO TABS: 50.0000 mg | ORAL_TABLET | Freq: Four times a day (QID) | ORAL | Status: DC | PRN

## 2014-12-18 MED ORDER — PREDNISONE 20 MG PO TABS: ORAL_TABLET | ORAL | Status: DC

## 2014-12-18 MED ORDER — INDOMETHACIN 25 MG PO CAPS: 25.0000 mg | ORAL_CAPSULE | Freq: Three times a day (TID) | ORAL | Status: DC | PRN

## 2014-12-18 NOTE — ED Provider Notes (Signed)
CSN: [id number]     Arrival date & time 12/18/14  4098 History   First MD Initiated Contact with Patient 12/18/14 0831     No chief complaint on file.    (Consider location/radiation/quality/duration/timing/severity/associated sxs/prior Treatment) HPI Comments: Patient presents with swelling of his left big toe. He states he's had problems since June with his toe. He was initially diagnosed with cellulitis of the toe but it did not respond to antibiotics. He was then diagnosed with gout and took a course of indomethacin which she is out of. He also has taken 2 courses of prednisone since the beginning of July. He states that the prednisone typically makes it go away completely but then it tends to flare back up again. He states his last dose of prednisone was in early August. He states the symptoms got much better than he felt like he didn't take long enough course because now it come back again. He started having increased pain and swelling with some redness 4 days ago. It's the same type of symptoms the Before. He denies any known injuries. He denies any past diagnosis of gout but he does have a family history of gout. He currently is not able to see his PCP as he doesn't have insurance. However his insurance starts back up on September 6 at that point he will make appointment with Malva Cogan, his PCP.   Past Medical History  Diagnosis Date  . Hypertension   . GERD (gastroesophageal reflux disease)   . Hypertension   . Gout    Past Surgical History  Procedure Laterality Date  . Negative     No family history on file. Social History  Substance Use Topics  . Smoking status: Never Smoker   . Smokeless tobacco: Never Used  . Alcohol Use: Yes     Comment: social    Review of Systems  Constitutional: Negative for fever.  Gastrointestinal: Negative for nausea and vomiting.  Musculoskeletal: Positive for joint swelling and arthralgias. Negative for back pain and neck pain.  Skin:  Positive for color change. Negative for wound.  Neurological: Negative for weakness, numbness and headaches.      Allergies  Review of patient's allergies indicates no known allergies.  Home Medications   Prior to Admission medications   Medication Sig Start Date End Date Taking? Authorizing Provider  esomeprazole (NEXIUM) 20 MG capsule Take 2 capsules (40 mg total) by mouth every morning. 11/02/13  Yes Benjiman Core, MD  lisinopril-hydrochlorothiazide (PRINZIDE) 20-12.5 MG per tablet Take 1 tablet by mouth daily. 11/11/14  Yes Nicole Pisciotta, PA-C  indomethacin (INDOCIN) 25 MG capsule Take 1 capsule (25 mg total) by mouth 3 (three) times daily as needed. 12/18/14   Rolan Bucco, MD  predniSONE (DELTASONE) 20 MG tablet 2 tabs po daily x 4 days 12/18/14   Rolan Bucco, MD  traMADol (ULTRAM) 50 MG tablet Take 1 tablet (50 mg total) by mouth every 6 (six) hours as needed. 12/18/14   Rolan Bucco, MD   BP 149/104 mmHg  Pulse 93  Temp(Src) 98.5 F (36.9 C) (Oral)  Resp 18  Ht  (1.753 m)  Wt 222 lb (100.699 kg)  BMI 32.77 kg/m2  SpO2 100% Physical Exam  Constitutional: He is oriented to person, place, and time. He appears well-developed and well-nourished.  HENT:  Head: Normocephalic and atraumatic.  Neck: Normal range of motion. Neck supple.  Cardiovascular: Normal rate.   Pulmonary/Chest: Effort normal.  Musculoskeletal: He exhibits edema and tenderness.  There is tenderness swelling and redness to the left big toe. There is no extension to the foot. There is no induration or fluctuance. No drainage.  Neurological: He is alert and oriented to person, place, and time.  Skin: Skin is warm and dry.  Psychiatric: He has a normal mood and affect.    ED Course  Procedures (including critical care time) Labs Review Labs Reviewed - No data to display  Imaging Review No results found. I have personally reviewed and evaluated these images and lab results as part of my  medical decision-making.   EKG Interpretation None      MDM   Final diagnoses:  Gout of big toe    Patient symptoms are consistent with a gout flareup. I did advise him that it's not good to be on ongoing steroids.  I did advise that we'll give him 1 more round of steroids but after that he should start using the indomethacin. I advised him not to use the 2 medications together. He will start indomethacin after he is finished his 5 day course of prednisone. I also gave him a prescription for tramadol. I encouraged him to follow-up with his PCP.    Rolan Bucco, MD 12/18/14 [phone]

## 2014-12-18 NOTE — ED Notes (Signed)
C/o gout in left great toe. Toe is swollen and red. Pt was seen here for same and prescribed deltasone 20 mg and has taken all of meds and states they worked but it is not well yet.

## 2014-12-18 NOTE — Discharge Instructions (Signed)

## 2015-01-16 ENCOUNTER — Emergency Department (HOSPITAL_BASED_OUTPATIENT_CLINIC_OR_DEPARTMENT_OTHER)
Admission: EM | Admit: 2015-01-16 | Discharge: 2015-01-16 | Disposition: A | Discharge status: Home / Self Care | Payer: BLUE CROSS/BLUE SHIELD | Attending: Emergency Medicine | Admitting: Emergency Medicine

## 2015-01-16 ENCOUNTER — Encounter (HOSPITAL_BASED_OUTPATIENT_CLINIC_OR_DEPARTMENT_OTHER): Payer: Self-pay | Admitting: *Deleted

## 2015-01-16 DIAGNOSIS — K219 Gastro-esophageal reflux disease without esophagitis: Secondary | ICD-10-CM | POA: Insufficient documentation

## 2015-01-16 DIAGNOSIS — M109 Gout, unspecified: Secondary | ICD-10-CM | POA: Insufficient documentation

## 2015-01-16 DIAGNOSIS — Z79899 Other long term (current) drug therapy: Secondary | ICD-10-CM | POA: Insufficient documentation

## 2015-01-16 DIAGNOSIS — M542 Cervicalgia: Secondary | ICD-10-CM | POA: Diagnosis present

## 2015-01-16 DIAGNOSIS — I10 Essential (primary) hypertension: Secondary | ICD-10-CM | POA: Diagnosis not present

## 2015-01-16 NOTE — ED Notes (Signed)
Resident MD at bedside.

## 2015-01-16 NOTE — ED Notes (Signed)
Pt reports sharp pain in left side of neck since Sunday. States pain radiates into face and chest at times. Denies known injury

## 2015-01-16 NOTE — ED Provider Notes (Signed)
CSN: [id number]     Arrival date & time 01/16/15  4098 History   First MD Initiated Contact with Patient 01/16/15 1007     Chief Complaint  Patient presents with  . Neck Pain    HPI John Simmons is a 39 y.o. male who presents to the ED with complaints of left neck pain. Patient localizes left neck pain to the lower anterior aspect of neck around sternocleidomastoid muscle. Patient states that pain started on Sunday night. Is intermittent sharp pain that can shoot to his left jaw or left clavicle. When it goes to his jaw he has some numbness. Pain when it occurs is about 6/10. Is made worse with movement. Denies any injury to neck. Has never had episode occur before. Patient concerned because he doesn't know what it is. Episodes last for a few seconds and resolve. Of note, patient recently started going back to the gym and lifting.    Past Medical History  Diagnosis Date  . Hypertension   . GERD (gastroesophageal reflux disease)   . Hypertension   . Gout    Past Surgical History  Procedure Laterality Date  . Negative     No family history on file. Social History  Substance Use Topics  . Smoking status: Never Smoker   . Smokeless tobacco: Never Used  . Alcohol Use: No     Comment: former    Review of Systems  Cardiovascular: Negative for chest pain.  Musculoskeletal: Positive for neck pain. Negative for neck stiffness.  Neurological: Negative for weakness.  All other systems reviewed and are negative. Also per HPI  Allergies  Review of patient's allergies indicates no known allergies.  Home Medications   Prior to Admission medications   Medication Sig Start Date End Date Taking? Authorizing Provider  esomeprazole (NEXIUM) 20 MG capsule Take 2 capsules (40 mg total) by mouth every morning. 11/02/13  Yes Benjiman Core, MD  lisinopril-hydrochlorothiazide (PRINZIDE) 20-12.5 MG per tablet Take 1 tablet by mouth daily. 11/11/14  Yes Nicole Pisciotta, PA-C  indomethacin  (INDOCIN) 25 MG capsule Take 1 capsule (25 mg total) by mouth 3 (three) times daily as needed. 12/18/14   Rolan Bucco, MD  predniSONE (DELTASONE) 20 MG tablet 2 tabs po daily x 4 days 12/18/14   Rolan Bucco, MD  traMADol (ULTRAM) 50 MG tablet Take 1 tablet (50 mg total) by mouth every 6 (six) hours as needed. 12/18/14   Rolan Bucco, MD   BP 151/115 mmHg  Pulse 84  Temp(Src) 98.4 F (36.9 C) (Oral)  Resp 18  Ht  (1.753 m)  Wt 225 lb (102.059 kg)  BMI 33.21 kg/m2  SpO2 96% Physical Exam  Constitutional: He appears well-developed and well-nourished. No distress.  HENT:  Head: Normocephalic and atraumatic.  Mouth/Throat: Oropharynx is clear and moist.  Eyes: EOM are normal. Pupils are equal, round, and reactive to light.  Neck: Normal range of motion. Neck supple. No tracheal deviation present. No thyromegaly present.  Cardiovascular: Normal rate, regular rhythm, normal heart sounds and intact distal pulses.   Pulmonary/Chest: Effort normal and breath sounds normal.  Abdominal: Soft. There is no tenderness.  Lymphadenopathy:    He has no cervical adenopathy.  Neurological: He has normal strength. No cranial nerve deficit or sensory deficit.  Skin: Skin is warm and dry.  Psychiatric: He has a normal mood and affect.    ED Course  Procedures (including critical care time) Labs Review Labs Reviewed - No data to display  Imaging Review No results found. I have personally reviewed and evaluated these images and lab results as part of my medical decision-making.   EKG Interpretation None      MDM   Final diagnoses:  Acute neck pain   Patient presented with acute left neck pain. No red flags on exam such as no neurological deficits, no masses/lesions, no signs of ischemia or cardiac etiology. Most likely etiology is musculoskeletal. Discussed with patient signs of ischemia and other return precautions. Should follow-up with PCP in next week if symptoms not improved.  Tylenol suggested prn for pain.     Caryl Ada, DO 01/16/2015, 2:29 PM PGY-2, [facility]    Pincus Large, DO 01/16/15 1433  Arby Barrette, MD 01/17/15 [phone]

## 2015-01-16 NOTE — Discharge Instructions (Signed)
Unsure of what is causing your neck pain Physical exam was unremarkable  Take Tylenol for pain relief See help right away if you have any trouble breathing, chest pain, dizziness/light-headed, weakness, etc. Please follow-up with you PCP if symptoms not improved by the end of the week.

## 2015-01-16 NOTE — ED Notes (Signed)
Pt given note for work. No new rx given

## 2015-02-12 ENCOUNTER — Other Ambulatory Visit: Payer: Self-pay | Admitting: Physician Assistant

## 2015-02-12 MED ORDER — LISINOPRIL-HYDROCHLOROTHIAZIDE 20-12.5 MG PO TABS: 1.0000 | ORAL_TABLET | Freq: Every day | ORAL | Status: DC

## 2015-02-20 ENCOUNTER — Encounter (HOSPITAL_BASED_OUTPATIENT_CLINIC_OR_DEPARTMENT_OTHER): Payer: Self-pay | Admitting: *Deleted

## 2015-02-20 ENCOUNTER — Emergency Department (HOSPITAL_BASED_OUTPATIENT_CLINIC_OR_DEPARTMENT_OTHER)
Admission: EM | Admit: 2015-02-20 | Discharge: 2015-02-20 | Disposition: A | Discharge status: Home / Self Care | Payer: BLUE CROSS/BLUE SHIELD | Attending: Emergency Medicine | Admitting: Emergency Medicine

## 2015-02-20 DIAGNOSIS — I10 Essential (primary) hypertension: Secondary | ICD-10-CM | POA: Diagnosis not present

## 2015-02-20 DIAGNOSIS — Z760 Encounter for issue of repeat prescription: Secondary | ICD-10-CM

## 2015-02-20 DIAGNOSIS — Z79899 Other long term (current) drug therapy: Secondary | ICD-10-CM | POA: Diagnosis not present

## 2015-02-20 DIAGNOSIS — K219 Gastro-esophageal reflux disease without esophagitis: Secondary | ICD-10-CM | POA: Diagnosis not present

## 2015-02-20 DIAGNOSIS — Z8739 Personal history of other diseases of the musculoskeletal system and connective tissue: Secondary | ICD-10-CM | POA: Diagnosis not present

## 2015-02-20 MED ORDER — LISINOPRIL-HYDROCHLOROTHIAZIDE 20-12.5 MG PO TABS: 1.0000 | ORAL_TABLET | Freq: Every day | ORAL | Status: DC

## 2015-02-20 NOTE — ED Provider Notes (Signed)
CSN: [id number]     Arrival date & time 02/20/15  [id number] History   First MD Initiated Contact with Patient 02/20/15 [phone]     No chief complaint on file.    (Consider location/radiation/quality/duration/timing/severity/associated sxs/prior Treatment) HPI Patient reports he ran out of his blood pressure medications about a week ago. He reports he's had a difficult time with scheduling to get into his family doctor. He denies he has any significant associated symptoms. He does report though whenever he runs out of his medications he just feels a little "out of it". He denies any visual changes, no headache, no chest pain. He has had no lower extremity swelling.  I did see the patient approximately a month ago for left-sided neck pain along the sternocleidomastoid. At that time symptoms seem to muscle skeletal. The patient reports that the pain has resolved and he has not developed any new symptoms. Past Medical History  Diagnosis Date  . Hypertension   . GERD (gastroesophageal reflux disease)   . Hypertension   . Gout    Past Surgical History  Procedure Laterality Date  . Negative     No family history on file. Social History  Substance Use Topics  . Smoking status: Never Smoker   . Smokeless tobacco: Never Used  . Alcohol Use: No     Comment: former    Review of Systems 10 Systems reviewed and are negative for acute change except as noted in the HPI.    Allergies  Review of patient's allergies indicates no known allergies.  Home Medications   Prior to Admission medications   Medication Sig Start Date End Date Taking? Authorizing Provider  esomeprazole (NEXIUM) 20 MG capsule Take 2 capsules (40 mg total) by mouth every morning. 11/02/13  Yes Benjiman CoreNathan Pickering, MD  lisinopril-hydrochlorothiazide (PRINZIDE,ZESTORETIC) 20-12.5 MG tablet Take 1 tablet by mouth daily. 02/12/15  Yes Waldon MerlWilliam C Martin, PA-C  lisinopril-hydrochlorothiazide (ZESTORETIC) 20-12.5 MG tablet Take 1 tablet by  mouth daily. 02/20/15   Arby BarretteMarcy Janera Peugh, MD   BP 122/93 mmHg  Pulse 84  Temp(Src) 98.1 F (36.7 C) (Oral)  Resp 16  Ht 5\' 9"  (1.753 m)  SpO2 100% Physical Exam  Constitutional: He is oriented to person, place, and time. He appears well-developed and well-nourished.  HENT:  Head: Normocephalic and atraumatic.  Eyes: EOM are normal. Pupils are equal, round, and reactive to light.  Neck: Neck supple. No tracheal deviation present. No thyromegaly present.  Cardiovascular: Normal rate, regular rhythm, normal heart sounds and intact distal pulses.   Pulmonary/Chest: Effort normal and breath sounds normal.  Abdominal: Soft. Bowel sounds are normal. He exhibits no distension. There is no tenderness.  Musculoskeletal: Normal range of motion. He exhibits no edema.  Lymphadenopathy:    He has no cervical adenopathy.  Neurological: He is alert and oriented to person, place, and time. He has normal strength. No cranial nerve deficit. He exhibits normal muscle tone. Coordination normal. GCS eye subscore is 4. GCS verbal subscore is 5. GCS motor subscore is 6.  Skin: Skin is warm, dry and intact.  Psychiatric: He has a normal mood and affect.    ED Course  Procedures (including critical care time) Labs Review Labs Reviewed - No data to display  Imaging Review No results found. I have personally reviewed and evaluated these images and lab results as part of my medical decision-making.   EKG Interpretation None      MDM   Final diagnoses:  Medication refill  Essential hypertension  Patient is well in appearance with a negative review of systems. At this point time blood pressure medication will be refilled with instructions to schedule point with his family doctor as soon as possible.    Arby Barrette, MD 02/20/15 [phone]

## 2015-02-20 NOTE — ED Notes (Signed)
States he ran out of BP meds 1 week ago and states his pressure has went up since. Denies sx except states he feels out of it

## 2015-02-20 NOTE — Discharge Instructions (Signed)
DASH Eating Plan °DASH stands for "Dietary Approaches to Stop Hypertension." The DASH eating plan is a healthy eating plan that has been shown to reduce high blood pressure (hypertension). Additional health benefits may include reducing the risk of type 2 diabetes mellitus, heart disease, and stroke. The DASH eating plan may also help with weight loss. °WHAT DO I NEED TO KNOW ABOUT THE DASH EATING PLAN? °For the DASH eating plan, you will follow these general guidelines: °· Choose foods with a percent daily value for sodium of less than 5% (as listed on the food label). °· Use salt-free seasonings or herbs instead of table salt or sea salt. °· Check with your health care provider or pharmacist before using salt substitutes. °· Eat lower-sodium products, often labeled as "lower sodium" or "no salt added." °· Eat fresh foods. °· Eat more vegetables, fruits, and low-fat dairy products. °· Choose whole grains. Look for the word "whole" as the first word in the ingredient list. °· Choose fish and skinless chicken or turkey more often than red meat. Limit fish, poultry, and meat to 6 oz (170 g) each day. °· Limit sweets, desserts, sugars, and sugary drinks. °· Choose heart-healthy fats. °· Limit cheese to 1 oz (28 g) per day. °· Eat more home-cooked food and less restaurant, buffet, and fast food. °· Limit fried foods. °· Cook foods using methods other than frying. °· Limit canned vegetables. If you do use them, rinse them well to decrease the sodium. °· When eating at a restaurant, ask that your food be prepared with less salt, or no salt if possible. °WHAT FOODS CAN I EAT? °Seek help from a dietitian for individual calorie needs. °Grains °Whole grain or whole wheat bread. Brown rice. Whole grain or whole wheat pasta. Quinoa, bulgur, and whole grain cereals. Low-sodium cereals. Corn or whole wheat flour tortillas. Whole grain cornbread. Whole grain crackers. Low-sodium crackers. °Vegetables °Fresh or frozen vegetables  (raw, steamed, roasted, or grilled). Low-sodium or reduced-sodium tomato and vegetable juices. Low-sodium or reduced-sodium tomato sauce and paste. Low-sodium or reduced-sodium canned vegetables.  °Fruits °All fresh, canned (in natural juice), or frozen fruits. °Meat and Other Protein Products °Ground beef (85% or leaner), grass-fed beef, or beef trimmed of fat. Skinless chicken or turkey. Ground chicken or turkey. Pork trimmed of fat. All fish and seafood. Eggs. Dried beans, peas, or lentils. Unsalted nuts and seeds. Unsalted canned beans. °Dairy °Low-fat dairy products, such as skim or 1% milk, 2% or reduced-fat cheeses, low-fat ricotta or cottage cheese, or plain low-fat yogurt. Low-sodium or reduced-sodium cheeses. °Fats and Oils °Tub margarines without trans fats. Light or reduced-fat mayonnaise and salad dressings (reduced sodium). Avocado. Safflower, olive, or canola oils. Natural peanut or almond butter. °Other °Unsalted popcorn and pretzels. °The items listed above may not be a complete list of recommended foods or beverages. Contact your dietitian for more options. °WHAT FOODS ARE NOT RECOMMENDED? °Grains °White bread. White pasta. White rice. Refined cornbread. Bagels and croissants. Crackers that contain trans fat. °Vegetables °Creamed or fried vegetables. Vegetables in a cheese sauce. Regular canned vegetables. Regular canned tomato sauce and paste. Regular tomato and vegetable juices. °Fruits °Dried fruits. Canned fruit in light or heavy syrup. Fruit juice. °Meat and Other Protein Products °Fatty cuts of meat. Ribs, chicken wings, bacon, sausage, bologna, salami, chitterlings, fatback, hot dogs, bratwurst, and packaged luncheon meats. Salted nuts and seeds. Canned beans with salt. °Dairy °Whole or 2% milk, cream, half-and-half, and cream cheese. Whole-fat or sweetened yogurt. Full-fat   cheeses or blue cheese. Nondairy creamers and whipped toppings. Processed cheese, cheese spreads, or cheese  curds. °Condiments °Onion and garlic salt, seasoned salt, table salt, and sea salt. Canned and packaged gravies. Worcestershire sauce. Tartar sauce. Barbecue sauce. Teriyaki sauce. Soy sauce, including reduced sodium. Steak sauce. Fish sauce. Oyster sauce. Cocktail sauce. Horseradish. Ketchup and mustard. Meat flavorings and tenderizers. Bouillon cubes. Hot sauce. Tabasco sauce. Marinades. Taco seasonings. Relishes. °Fats and Oils °Butter, stick margarine, lard, shortening, ghee, and bacon fat. Coconut, palm kernel, or palm oils. Regular salad dressings. °Other °Pickles and olives. Salted popcorn and pretzels. °The items listed above may not be a complete list of foods and beverages to avoid. Contact your dietitian for more information. °WHERE CAN I FIND MORE INFORMATION? °National Heart, Lung, and Blood Institute: www.nhlbi.nih.gov/health/health-topics/topics/dash/ °  °This information is not intended to replace advice given to you by your health care provider. Make sure you discuss any questions you have with your health care provider. °  °Document Released: 03/27/2011 Document Revised: 04/28/2014 Document Reviewed: 02/09/2013 °Elsevier Interactive Patient Education ©2016 Elsevier Inc. ° °Hypertension °Hypertension, commonly called high blood pressure, is when the force of blood pumping through your arteries is too strong. Your arteries are the blood vessels that carry blood from your heart throughout your body. A blood pressure reading consists of a higher number over a lower number, such as 110/72. The higher number (systolic) is the pressure inside your arteries when your heart pumps. The lower number (diastolic) is the pressure inside your arteries when your heart relaxes. Ideally you want your blood pressure below 120/80. °Hypertension forces your heart to work harder to pump blood. Your arteries may become narrow or stiff. Having untreated or uncontrolled hypertension can cause heart attack, stroke, kidney  disease, and other problems. °RISK FACTORS °Some risk factors for high blood pressure are controllable. Others are not.  °Risk factors you cannot control include:  °· Race. You may be at higher risk if you are African American. °· Age. Risk increases with age. °· Gender. Men are at higher risk than women before age 45 years. After age 65, women are at higher risk than men. °Risk factors you can control include: °· Not getting enough exercise or physical activity. °· Being overweight. °· Getting too much fat, sugar, calories, or salt in your diet. °· Drinking too much alcohol. °SIGNS AND SYMPTOMS °Hypertension does not usually cause signs or symptoms. Extremely high blood pressure (hypertensive crisis) may cause headache, anxiety, shortness of breath, and nosebleed. °DIAGNOSIS °To check if you have hypertension, your health care provider will measure your blood pressure while you are seated, with your arm held at the level of your heart. It should be measured at least twice using the same arm. Certain conditions can cause a difference in blood pressure between your right and left arms. A blood pressure reading that is higher than normal on one occasion does not mean that you need treatment. If it is not clear whether you have high blood pressure, you may be asked to return on a different day to have your blood pressure checked again. Or, you may be asked to monitor your blood pressure at home for 1 or more weeks. °TREATMENT °Treating high blood pressure includes making lifestyle changes and possibly taking medicine. Living a healthy lifestyle can help lower high blood pressure. You may need to change some of your habits. °Lifestyle changes may include: °· Following the DASH diet. This diet is high in fruits, vegetables, and whole   grains. It is low in salt, red meat, and added sugars.  Keep your sodium intake below 2,300 mg per day.  Getting at least 30-45 minutes of aerobic exercise at least 4 times per  week.  Losing weight if necessary.  Not smoking.  Limiting alcoholic beverages.  Learning ways to reduce stress. Your health care provider may prescribe medicine if lifestyle changes are not enough to get your blood pressure under control, and if one of the following is true:  You are 4718-41 years of age and your systolic blood pressure is above 140.  You are 41 years of age or older, and your systolic blood pressure is above 150.  Your diastolic blood pressure is above 90.  You have diabetes, and your systolic blood pressure is over 140 or your diastolic blood pressure is over 90.  You have kidney disease and your blood pressure is above 140/90.  You have heart disease and your blood pressure is above 140/90. Your personal target blood pressure may vary depending on your medical conditions, your age, and other factors. HOME CARE INSTRUCTIONS  Have your blood pressure rechecked as directed by your health care provider.   Take medicines only as directed by your health care provider. Follow the directions carefully. Blood pressure medicines must be taken as prescribed. The medicine does not work as well when you skip doses. Skipping doses also puts you at risk for problems.  Do not smoke.   Monitor your blood pressure at home as directed by your health care provider. SEEK MEDICAL CARE IF:   You think you are having a reaction to medicines taken.  You have recurrent headaches or feel dizzy.  You have swelling in your ankles.  You have trouble with your vision. SEEK IMMEDIATE MEDICAL CARE IF:  You develop a severe headache or confusion.  You have unusual weakness, numbness, or feel faint.  You have severe chest or abdominal pain.  You vomit repeatedly.  You have trouble breathing. MAKE SURE YOU:   Understand these instructions.  Will watch your condition.  Will get help right away if you are not doing well or get worse.   This information is not intended to  replace advice given to you by your health care provider. Make sure you discuss any questions you have with your health care provider.   Document Released: 04/07/2005 Document Revised: 08/22/2014 Document Reviewed: 01/28/2013 Elsevier Interactive Patient Education 2016 Elsevier Inc.  Heart Disease Prevention Heart disease is a leading cause of death. There are many things you can do to help prevent heart disease. BE PHYSICALLY ACTIVE Physical activity is good for your heart. It helps control your blood pressure, cholesterol levels, and weight. Try to be physically active every day. Ask your health care provider what activities are best for you.  BE A HEALTHY WEIGHT Extra weight can strain your heart and affect your blood pressure and cholesterol levels. Lose weight with diet and exercise if recommended by your health care provider. EAT HEART-HEALTHY FOODS Follow a healthy eating plan as recommended by your health care provider or dietitian. Heart-healthy foods include:   High-fiber foods. These include oat bran, oatmeal, and whole-grain breads and cereals.  Fruits and vegetables. Avoid:  Alcohol.  Fried foods.  Foods high in saturated fat. These include meats, butter, whole dairy products, shortening, and coconut or palm oil.  Salty foods. These include canned food, luncheon meat, salty snacks, and fast food. KEEP YOUR CHOLESTEROL LEVELS UNDER CONTROL Cholesterol is a substance that  is used for many important functions. When your cholesterol levels are high, cholesterol can stick to the insides of your blood vessels, making them narrow or clog. This can lead to chest pain (angina) and a heart attack.  Keep your cholesterol levels under control as recommended by your health care provider. Have your cholesterol checked at least once a year. Target cholesterol levels (in mg/dL) for most people are:   Total cholesterol below 200.  LDL cholesterol below 100.  HDL cholesterol above 40 in  men and above 50 in women.  Triglycerides below 150. KEEP YOUR BLOOD PRESSURE UNDER CONTROL Having high blood pressure (hypertension) puts you at risk for stroke and other forms of heart disease. Keep your blood pressure under control as recommended by your health care provider. Ask your health care provider if you need treatment to lower your blood pressure. If you are 44-84 years of age, have your blood pressure checked every 3-5 years. If you are 63 years of age or older, have your blood pressure checked every year. DO NOT USE TOBACCO PRODUCTS Tobacco smoke can damage your heart and blood vessels. Do not use any tobacco products including cigarettes, chewing tobacco, or electronic cigarettes. If you need help quitting, ask your health care provider. TAKE MEDICINES AS DIRECTED Take medicines only as directed by your health care provider. Ask your health care provider whether you should take an aspirin every day. Taking aspirin can help reduce your risk of heart disease and stroke.  FOR MORE INFORMATION  To find out more about heart disease, visit the American Heart Association's website at www.americanheart.org   This information is not intended to replace advice given to you by your health care provider. Make sure you discuss any questions you have with your health care provider.   Document Released: 11/20/2003 Document Revised: 04/28/2014 Document Reviewed: 06/01/2013 Elsevier Interactive Patient Education [employer]

## 2015-02-26 ENCOUNTER — Ambulatory Visit: Payer: BLUE CROSS/BLUE SHIELD | Admitting: Physician Assistant

## 2015-02-27 ENCOUNTER — Encounter: Payer: Self-pay | Admitting: Physician Assistant

## 2015-02-27 ENCOUNTER — Ambulatory Visit (INDEPENDENT_AMBULATORY_CARE_PROVIDER_SITE_OTHER): Payer: BLUE CROSS/BLUE SHIELD | Admitting: Physician Assistant

## 2015-02-27 VITALS — BP 112/72 | HR 98 | Temp 98.4°F | Resp 16 | Ht 69.0 in | Wt 229.4 lb

## 2015-02-27 DIAGNOSIS — F418 Other specified anxiety disorders: Secondary | ICD-10-CM | POA: Diagnosis not present

## 2015-02-27 DIAGNOSIS — K649 Unspecified hemorrhoids: Secondary | ICD-10-CM | POA: Diagnosis not present

## 2015-02-27 DIAGNOSIS — F419 Anxiety disorder, unspecified: Secondary | ICD-10-CM | POA: Insufficient documentation

## 2015-02-27 DIAGNOSIS — I10 Essential (primary) hypertension: Secondary | ICD-10-CM

## 2015-02-27 DIAGNOSIS — F32A Depression, unspecified: Secondary | ICD-10-CM

## 2015-02-27 DIAGNOSIS — F329 Major depressive disorder, single episode, unspecified: Secondary | ICD-10-CM

## 2015-02-27 MED ORDER — ESCITALOPRAM OXALATE 10 MG PO TABS: 10.0000 mg | ORAL_TABLET | Freq: Every day | ORAL | Status: DC

## 2015-02-27 NOTE — Assessment & Plan Note (Signed)
Rx Lexapro 10 mg daily. Supportive measures reviewed. Follow-up 1 month.

## 2015-02-27 NOTE — Progress Notes (Signed)
    Patient presents to clinic today for follow-up of hypertension. Patient has been in the ER multiple times for high blood pressure. Has not been seen here in clinic in 1 year. Is taking his lisinopril-HCTZ daily now that medications are refilled. Patient denies chest pain, palpitations, lightheadedness, dizziness, vision changes or frequent headaches.  BP Readings from Last 3 Encounters:  02/27/15 112/72  02/20/15 122/93  01/16/15 151/115   Patient does feel BP gets elevated throughout the week some due to increased stressors at work. Endorses increased anxiety without panic attack. Endorses depressed mood and anhedonia without SI/HI.  [facility] of chronic hemorrhoids. Endorses asymptomatic at present.  Past Medical History  Diagnosis Date  . Hypertension   . GERD (gastroesophageal reflux disease)   . Hypertension   . Gout     Current Outpatient Prescriptions on File Prior to Visit  Medication Sig Dispense Refill  . esomeprazole (NEXIUM) 20 MG capsule Take 2 capsules (40 mg total) by mouth every morning. 60 capsule 0  . lisinopril-hydrochlorothiazide (PRINZIDE,ZESTORETIC) 20-12.5 MG tablet Take 1 tablet by mouth daily. 30 tablet 0   No current facility-administered medications on file prior to visit.    No Known Allergies  No family history on file.  Social History   Social History  . Marital Status: Married    Spouse Name: N/A  . Number of Children: N/A  . Years of Education: N/A   Social History Main Topics  . Smoking status: Never Smoker   . Smokeless tobacco: Never Used  . Alcohol Use: No     Comment: former  . Drug Use: No  . Sexual Activity: No   Other Topics Concern  . None   Social History Narrative   Review of Systems - See HPI.  All other ROS are negative.  BP 112/72 mmHg  Pulse 98  Temp(Src) 98.4 F (36.9 C) (Oral)  Resp 16  Ht 5\' 9"  (1.753 m)  Wt 229 lb 6 oz (104.044  kg)  BMI 33.86 kg/m2  SpO2 99%  Physical Exam  Constitutional: He is oriented to person, place, and time and well-developed, well-nourished, and in no distress.  HENT:  Head: Normocephalic and atraumatic.  Eyes: Conjunctivae are normal.  Neck: Neck supple.  Cardiovascular: Normal rate, regular rhythm, normal heart sounds and intact distal pulses.   Pulmonary/Chest: Effort normal and breath sounds normal. No respiratory distress. He has no wheezes. He has no rales. He exhibits no tenderness.  Neurological: He is alert and oriented to person, place, and time.  Skin: Skin is warm and dry. No rash noted.  Psychiatric: He exhibits a depressed mood.  Vitals reviewed.   No results found for this or any previous visit (from the past 2160 hour(s)).  Assessment/Plan: Essential hypertension, benign BP good today. Medication compliance has been issue previously. Will check CMP today. Continue current regimen. Will also add-on a1c to assess for diabetes.  Anxiety and depression Rx Lexapro 10 mg daily. Supportive measures reviewed. Follow-up 1 month.

## 2015-02-27 NOTE — Progress Notes (Signed)
Pre visit review using our clinic review tool, if applicable. No additional management support is needed unless otherwise documented below in the visit note/SLS  

## 2015-02-27 NOTE — Patient Instructions (Signed)
Please continue BP medications as directed. Refills have been sent in.  Please start the Lexapro daily as directed.  Follow the dietary measures below.  Follow-up with me in 1 month.  DASH Eating Plan DASH stands for "Dietary Approaches to Stop Hypertension." The DASH eating plan is a healthy eating plan that has been shown to reduce high blood pressure (hypertension). Additional health benefits may include reducing the risk of type 2 diabetes mellitus, heart disease, and stroke. The DASH eating plan may also help with weight loss. WHAT DO I NEED TO KNOW ABOUT THE DASH EATING PLAN? For the DASH eating plan, you will follow these general guidelines:  Choose foods with a percent daily value for sodium of less than 5% (as listed on the food label).  Use salt-free seasonings or herbs instead of table salt or sea salt.  Check with your health care provider or pharmacist before using salt substitutes.  Eat lower-sodium products, often labeled as "lower sodium" or "no salt added."  Eat fresh foods.  Eat more vegetables, fruits, and low-fat dairy products.  Choose whole grains. Look for the word "whole" as the first word in the ingredient list.  Choose fish and skinless chicken or [place] more often than red meat. Limit fish, poultry, and meat to 6 oz (170 g) each day.  Limit sweets, desserts, sugars, and sugary drinks.  Choose heart-healthy fats.  Limit cheese to 1 oz (28 g) per day.  Eat more home-cooked food and less restaurant, buffet, and fast food.  Limit fried foods.  Cook foods using methods other than frying.  Limit canned vegetables. If you do use them, rinse them well to decrease the sodium.  When eating at a restaurant, ask that your food be prepared with less salt, or no salt if possible. WHAT FOODS CAN I EAT? Seek help from a dietitian for individual calorie needs. Grains Whole grain or whole wheat bread. Brown rice. Whole grain or whole wheat pasta. Quinoa, bulgur,  and whole grain cereals. Low-sodium cereals. Corn or whole wheat flour tortillas. Whole grain cornbread. Whole grain crackers. Low-sodium crackers. Vegetables Fresh or frozen vegetables (raw, steamed, roasted, or grilled). Low-sodium or reduced-sodium tomato and vegetable juices. Low-sodium or reduced-sodium tomato sauce and paste. Low-sodium or reduced-sodium canned vegetables.  Fruits All fresh, canned (in natural juice), or frozen fruits. Meat and Other Protein Products Ground beef (85% or leaner), grass-fed beef, or beef trimmed of fat. Skinless chicken or [place]. Ground chicken or [place]. Pork trimmed of fat. All fish and seafood. Eggs. Dried beans, peas, or lentils. Unsalted nuts and seeds. Unsalted canned beans. Dairy Low-fat dairy products, such as skim or 1% milk, 2% or reduced-fat cheeses, low-fat ricotta or cottage cheese, or plain low-fat yogurt. Low-sodium or reduced-sodium cheeses. Fats and Oils Tub margarines without trans fats. Light or reduced-fat mayonnaise and salad dressings (reduced sodium). Avocado. Safflower, olive, or canola oils. Natural peanut or almond butter. Other Unsalted popcorn and pretzels. The items listed above may not be a complete list of recommended foods or beverages. Contact your dietitian for more options. WHAT FOODS ARE NOT RECOMMENDED? Grains White bread. White pasta. White rice. Refined cornbread. Bagels and croissants. Crackers that contain trans fat. Vegetables Creamed or fried vegetables. Vegetables in a cheese sauce. Regular canned vegetables. Regular canned tomato sauce and paste. Regular tomato and vegetable juices. Fruits Dried fruits. Canned fruit in light or heavy syrup. Fruit juice. Meat and Other Protein Products Fatty cuts of meat. Ribs, chicken wings, bacon, sausage, bologna,  salami, chitterlings, fatback, hot dogs, bratwurst, and packaged luncheon meats. Salted nuts and seeds. Canned beans with salt. Dairy Whole or 2% milk, cream,  half-and-half, and cream cheese. Whole-fat or sweetened yogurt. Full-fat cheeses or blue cheese. Nondairy creamers and whipped toppings. Processed cheese, cheese spreads, or cheese curds. Condiments Onion and garlic salt, seasoned salt, table salt, and sea salt. Canned and packaged gravies. Worcestershire sauce. Tartar sauce. Barbecue sauce. Teriyaki sauce. Soy sauce, including reduced sodium. Steak sauce. Fish sauce. Oyster sauce. Cocktail sauce. Horseradish. Ketchup and mustard. Meat flavorings and tenderizers. Bouillon cubes. Hot sauce. Tabasco sauce. Marinades. Taco seasonings. Relishes. Fats and Oils Butter, stick margarine, lard, shortening, ghee, and bacon fat. Coconut, palm kernel, or palm oils. Regular salad dressings. Other Pickles and olives. Salted popcorn and pretzels. The items listed above may not be a complete list of foods and beverages to avoid. Contact your dietitian for more information. WHERE CAN I FIND MORE INFORMATION? National Heart, Lung, and Blood Institute: [employer].itwww.nhlbi.nih.gov/health/health-topics/topics/dash/   This information is not intended to replace advice given to you by your health care provider. Make sure you discuss any questions you have with your health care provider.   Document Released: 03/27/2011 Document Revised: 04/28/2014 Document Reviewed: 02/09/2013 Elsevier Interactive Patient Education [employer] Elsevier Inc.

## 2015-02-27 NOTE — Assessment & Plan Note (Signed)
BP good today. Medication compliance has been issue previously. Will check CMP today. Continue current regimen. Will also add-on a1c to assess for diabetes.

## 2015-02-28 ENCOUNTER — Encounter: Payer: Self-pay | Admitting: Physician Assistant

## 2015-02-28 ENCOUNTER — Telehealth: Payer: Self-pay | Admitting: Physician Assistant

## 2015-02-28 NOTE — Telephone Encounter (Signed)
Patient advised.

## 2015-02-28 NOTE — Telephone Encounter (Signed)
Relation to GE:XBMWpt:self Call back number: [phone]   Reason for call:  Patient states his employment is requiring a return to work note that has to state 03/01/15, patient state he wanted to go to work today but they did not permit him too without a letter,patient would like to pick up letter today.

## 2015-02-28 NOTE — Telephone Encounter (Signed)
Note written. Will be at front desk for pickup.

## 2015-03-05 ENCOUNTER — Telehealth: Payer: Self-pay | Admitting: *Deleted

## 2015-03-05 ENCOUNTER — Encounter: Payer: Self-pay | Admitting: Physician Assistant

## 2015-03-05 NOTE — Telephone Encounter (Signed)
[facility]. JG//CMA

## 2015-03-06 ENCOUNTER — Telehealth: Payer: Self-pay | Admitting: Physician Assistant

## 2015-03-06 NOTE — Telephone Encounter (Signed)
LMOM [detailed] with contact name and number for return call RE: needed information to complete Employer paperwork for work accommodations with time deadline per provider instructions/SLS

## 2015-03-06 NOTE — Telephone Encounter (Signed)
Received work accomodation forms from employer. I only saw patient for follow-up and we did not discuss need for paperwork due to absences the week prior.  I need information regarding when he was out of work (dates) and what the reason for missing the days were. HR representative notes he was out from around 10/31 -11/7

## 2015-03-07 DIAGNOSIS — Z7689 Persons encountering health services in other specified circumstances: Secondary | ICD-10-CM

## 2015-03-07 NOTE — Telephone Encounter (Signed)
Forms completed and forwarded to CMA, Amado CoeJessica Glover.   Please call patient to let him know paperwork is complete. Please fax to employer and make copy (1 for me and 1 for patient to pick up)  Thank you.

## 2015-03-07 NOTE — Telephone Encounter (Signed)
Patient returned your call stating dates are as follow: 10/31-11/7 due to BP concerns patient did state he had no insurance at the time of incidents therefore he had to go to [facility] by ambulance and another time patient went to [facility]. Patient states insurance finally kicked in and that's when he scheduled an appointment with PCP. The deadline for employer is at the end of this week. Patient is available for any follow up questions before 9am today and at 1pm. Please fax paperwork Att[facility]:  [facility]  fax [facility] [phone]

## 2015-03-07 NOTE — Telephone Encounter (Signed)
See other phone note

## 2015-03-07 NOTE — Telephone Encounter (Signed)
Faxed successfully. Sent for scanning. JG//CMA 

## 2015-03-19 ENCOUNTER — Encounter (HOSPITAL_BASED_OUTPATIENT_CLINIC_OR_DEPARTMENT_OTHER): Payer: Self-pay | Admitting: Emergency Medicine

## 2015-03-19 ENCOUNTER — Emergency Department (HOSPITAL_BASED_OUTPATIENT_CLINIC_OR_DEPARTMENT_OTHER)
Admission: EM | Admit: 2015-03-19 | Discharge: 2015-03-19 | Disposition: A | Discharge status: Home / Self Care | Payer: BLUE CROSS/BLUE SHIELD | Attending: Emergency Medicine | Admitting: Emergency Medicine

## 2015-03-19 DIAGNOSIS — Z79899 Other long term (current) drug therapy: Secondary | ICD-10-CM | POA: Insufficient documentation

## 2015-03-19 DIAGNOSIS — M109 Gout, unspecified: Secondary | ICD-10-CM | POA: Diagnosis not present

## 2015-03-19 DIAGNOSIS — M79675 Pain in left toe(s): Secondary | ICD-10-CM | POA: Diagnosis present

## 2015-03-19 DIAGNOSIS — I10 Essential (primary) hypertension: Secondary | ICD-10-CM | POA: Insufficient documentation

## 2015-03-19 DIAGNOSIS — K219 Gastro-esophageal reflux disease without esophagitis: Secondary | ICD-10-CM | POA: Insufficient documentation

## 2015-03-19 MED ORDER — HYDROCODONE-ACETAMINOPHEN 5-325 MG PO TABS: 1.0000 | ORAL_TABLET | Freq: Four times a day (QID) | ORAL | Status: DC | PRN

## 2015-03-19 MED ORDER — INDOMETHACIN 25 MG PO CAPS: 25.0000 mg | ORAL_CAPSULE | Freq: Three times a day (TID) | ORAL | Status: DC | PRN

## 2015-03-19 NOTE — ED Notes (Signed)
Pt with gout to left big toe and small toe, previous history of gout

## 2015-03-19 NOTE — Discharge Instructions (Signed)

## 2015-03-19 NOTE — ED Notes (Signed)
Patient stable and ambulatory.  Patient verbalizes understanding of discharge medications, instructions and follow-up. 

## 2015-03-19 NOTE — ED Provider Notes (Signed)
CSN: [id number]     Arrival date & time 03/19/15  [id number] History   First MD Initiated Contact with Patient 03/19/15 1011     Chief Complaint  Patient presents with  . Foot Pain     (Consider location/radiation/quality/duration/timing/severity/associated sxs/prior Treatment) HPI Comments: Patient with past medical history remarkable for hypertension, GERD, and gout presents to the emergency department with chief complaint of left great toe pain. He states that he began having pain 2 days ago. States that the pain is severe. It is worsened with movement and palpation. He states that he cannot "cover his toe with anything." He states that it feels exactly like his previous episode of gout. He denies any fevers, or chills. Denies any ankle pain, calf pain, her knee pain. Denies any mechanism of injury. He states that he was treated in the summer for gout with good relief. He cannot remember what he took. Patient believes his symptoms have been aggravated by what he ate over the holiday weekend.  The history is provided by the patient. No language interpreter was used.    Past Medical History  Diagnosis Date  . Hypertension   . GERD (gastroesophageal reflux disease)   . Hypertension   . Gout    Past Surgical History  Procedure Laterality Date  . Negative     History reviewed. No pertinent family history. Social History  Substance Use Topics  . Smoking status: Never Smoker   . Smokeless tobacco: Never Used  . Alcohol Use: No     Comment: former    Review of Systems  Constitutional: Negative for fever.  Musculoskeletal: Positive for arthralgias.  Skin: Negative for rash and wound.  All other systems reviewed and are negative.     Allergies  Review of patient's allergies indicates no known allergies.  Home Medications   Prior to Admission medications   Medication Sig Start Date End Date Taking? Authorizing Provider  escitalopram (LEXAPRO) 10 MG tablet Take 1 tablet (10 mg  total) by mouth daily. 02/27/15   Waldon MerlWilliam C Martin, PA-C  esomeprazole (NEXIUM) 20 MG capsule Take 2 capsules (40 mg total) by mouth every morning. 11/02/13   Benjiman CoreNathan Pickering, MD  lisinopril-hydrochlorothiazide (PRINZIDE,ZESTORETIC) 20-12.5 MG tablet Take 1 tablet by mouth daily. 02/12/15   Waldon MerlWilliam C Martin, PA-C   BP 138/111 mmHg  Pulse 83  Temp(Src) 98.1 F (36.7 C) (Oral)  Resp 18  Ht 5\' 9"  (1.753 m)  Wt 102.059 kg  BMI 33.21 kg/m2  SpO2 100% Physical Exam  Constitutional: He is oriented to person, place, and time. He appears well-developed and well-nourished.  HENT:  Head: Normocephalic and atraumatic.  Eyes: Conjunctivae and EOM are normal.  Neck: Normal range of motion.  Cardiovascular: Normal rate.   Pulmonary/Chest: Effort normal.  Abdominal: He exhibits no distension.  Musculoskeletal: Normal range of motion.  Left toe tender to palpation, no bony abnormality or deformity, range of motion and strength limited secondary to pain  Neurological: He is alert and oriented to person, place, and time.  Skin: Skin is warm and dry. No rash noted.  No evidence of cellulitis, abscess, or infectious process  Psychiatric: He has a normal mood and affect. His behavior is normal. Judgment and thought content normal.  Nursing note and vitals reviewed.   ED Course  Procedures (including critical care time)   MDM   Final diagnoses:  Acute gout of left foot, unspecified cause    Patient with left great toe pain, likely gout. History of  the same. Treated in July with indomethacin and Vicodin with good relief. I will prescribe the same. Patient given instructions regarding taking indomethacin. Verbal and written instructions given to patient. He understands and agrees with plan. He is stable and ready for discharge.    Roxy Horseman, PA-C 03/19/15 1038  Melene Plan, DO 03/19/15 1127

## 2015-03-22 ENCOUNTER — Emergency Department (HOSPITAL_BASED_OUTPATIENT_CLINIC_OR_DEPARTMENT_OTHER)
Admission: EM | Admit: 2015-03-22 | Discharge: 2015-03-22 | Discharge status: Against Medical Advice | Payer: BLUE CROSS/BLUE SHIELD | Attending: Dermatology | Admitting: Dermatology

## 2015-03-22 DIAGNOSIS — I10 Essential (primary) hypertension: Secondary | ICD-10-CM | POA: Diagnosis not present

## 2015-03-22 DIAGNOSIS — M109 Gout, unspecified: Secondary | ICD-10-CM | POA: Diagnosis not present

## 2015-03-27 ENCOUNTER — Emergency Department (HOSPITAL_BASED_OUTPATIENT_CLINIC_OR_DEPARTMENT_OTHER)
Admission: EM | Admit: 2015-03-27 | Discharge: 2015-03-27 | Disposition: A | Discharge status: Home / Self Care | Payer: BLUE CROSS/BLUE SHIELD | Attending: Emergency Medicine | Admitting: Emergency Medicine

## 2015-03-27 ENCOUNTER — Encounter (HOSPITAL_BASED_OUTPATIENT_CLINIC_OR_DEPARTMENT_OTHER): Payer: Self-pay | Admitting: *Deleted

## 2015-03-27 DIAGNOSIS — Z79899 Other long term (current) drug therapy: Secondary | ICD-10-CM | POA: Diagnosis not present

## 2015-03-27 DIAGNOSIS — I10 Essential (primary) hypertension: Secondary | ICD-10-CM | POA: Diagnosis not present

## 2015-03-27 DIAGNOSIS — M109 Gout, unspecified: Secondary | ICD-10-CM | POA: Diagnosis present

## 2015-03-27 DIAGNOSIS — K219 Gastro-esophageal reflux disease without esophagitis: Secondary | ICD-10-CM | POA: Insufficient documentation

## 2015-03-27 DIAGNOSIS — M1A0720 Idiopathic chronic gout, left ankle and foot, without tophus (tophi): Secondary | ICD-10-CM | POA: Insufficient documentation

## 2015-03-27 MED ORDER — HYDROCODONE-ACETAMINOPHEN 5-325 MG PO TABS: 1.0000 | ORAL_TABLET | Freq: Four times a day (QID) | ORAL | Status: DC | PRN

## 2015-03-27 NOTE — Discharge Instructions (Signed)
Gout Keep your scheduled appointment with your primary care physician in 2 days Gout is an inflammatory arthritis caused by a buildup of uric acid crystals in the joints. Uric acid is a chemical that is normally present in the blood. When the level of uric acid in the blood is too high it can form crystals that deposit in your joints and tissues. This causes joint redness, soreness, and swelling (inflammation). Repeat attacks are common. Over time, uric acid crystals can form into masses (tophi) near a joint, destroying bone and causing disfigurement. Gout is treatable and often preventable. CAUSES  The disease begins with elevated levels of uric acid in the blood. Uric acid is produced by your body when it breaks down a naturally found substance called purines. Certain foods you eat, such as meats and fish, contain high amounts of purines. Causes of an elevated uric acid level include:  Being passed down from parent to child (heredity).  Diseases that cause increased uric acid production (such as obesity, psoriasis, and certain cancers).  Excessive alcohol use.  Diet, especially diets rich in meat and seafood.  Medicines, including certain cancer-fighting medicines (chemotherapy), water pills (diuretics), and aspirin.  Chronic kidney disease. The kidneys are no longer able to remove uric acid well.  Problems with metabolism. Conditions strongly associated with gout include:  Obesity.  High blood pressure.  High cholesterol.  Diabetes. Not everyone with elevated uric acid levels gets gout. It is not understood why some people get gout and others do not. Surgery, joint injury, and eating too much of certain foods are some of the factors that can lead to gout attacks. SYMPTOMS   An attack of gout comes on quickly. It causes intense pain with redness, swelling, and warmth in a joint.  Fever can occur.  Often, only one joint is involved. Certain joints are more commonly  involved:  Base of the big toe.  Knee.  Ankle.  Wrist.  Finger. Without treatment, an attack usually goes away in a few days to weeks. Between attacks, you usually will not have symptoms, which is different from many other forms of arthritis. DIAGNOSIS  Your caregiver will suspect gout based on your symptoms and exam. In some cases, tests may be recommended. The tests may include:  Blood tests.  Urine tests.  X-rays.  Joint fluid exam. This exam requires a needle to remove fluid from the joint (arthrocentesis). Using a microscope, gout is confirmed when uric acid crystals are seen in the joint fluid. TREATMENT  There are two phases to gout treatment: treating the sudden onset (acute) attack and preventing attacks (prophylaxis).  Treatment of an Acute Attack.  Medicines are used. These include anti-inflammatory medicines or steroid medicines.  An injection of steroid medicine into the affected joint is sometimes necessary.  The painful joint is rested. Movement can worsen the arthritis.  You may use warm or cold treatments on painful joints, depending which works best for you.  Treatment to Prevent Attacks.  If you suffer from frequent gout attacks, your caregiver may advise preventive medicine. These medicines are started after the acute attack subsides. These medicines either help your kidneys eliminate uric acid from your body or decrease your uric acid production. You may need to stay on these medicines for a very long time.  The early phase of treatment with preventive medicine can be associated with an increase in acute gout attacks. For this reason, during the first few months of treatment, your caregiver may also advise you  to take medicines usually used for acute gout treatment. Be sure you understand your caregiver's directions. Your caregiver may make several adjustments to your medicine dose before these medicines are effective.  Discuss dietary treatment with your  caregiver or dietitian. Alcohol and drinks high in sugar and fructose and foods such as meat, poultry, and seafood can increase uric acid levels. Your caregiver or dietitian can advise you on drinks and foods that should be limited. HOME CARE INSTRUCTIONS   Do not take aspirin to relieve pain. This raises uric acid levels.  Only take over-the-counter or prescription medicines for pain, discomfort, or fever as directed by your caregiver.  Rest the joint as much as possible. When in bed, keep sheets and blankets off painful areas.  Keep the affected joint raised (elevated).  Apply warm or cold treatments to painful joints. Use of warm or cold treatments depends on which works best for you.  Use crutches if the painful joint is in your leg.  Drink enough fluids to keep your urine clear or pale yellow. This helps your body get rid of uric acid. Limit alcohol, sugary drinks, and fructose drinks.  Follow your dietary instructions. Pay careful attention to the amount of protein you eat. Your daily diet should emphasize fruits, vegetables, whole grains, and fat-free or low-fat milk products. Discuss the use of coffee, vitamin C, and cherries with your caregiver or dietitian. These may be helpful in lowering uric acid levels.  Maintain a healthy body weight. SEEK MEDICAL CARE IF:   You develop diarrhea, vomiting, or any side effects from medicines.  You do not feel better in 24 hours, or you are getting worse. SEEK IMMEDIATE MEDICAL CARE IF:   Your joint becomes suddenly more tender, and you have chills or a fever. MAKE SURE YOU:   Understand these instructions.  Will watch your condition.  Will get help right away if you are not doing well or get worse.   This information is not intended to replace advice given to you by your health care provider. Make sure you discuss any questions you have with your health care provider.   Document Released: 04/04/2000 Document Revised: 04/28/2014  Document Reviewed: 11/19/2011 Elsevier Interactive Patient Education [employer] Elsevier Inc.

## 2015-03-27 NOTE — ED Notes (Signed)
Ran out of pain medication he was given for gout. Pain in his left great toe.

## 2015-03-27 NOTE — ED Provider Notes (Signed)
CSN: [id number]     Arrival date & time 03/27/15  1443 History   First MD Initiated Contact with Patient 03/27/15 1456     Chief Complaint  Patient presents with  . Gout     (Consider location/radiation/quality/duration/timing/severity/associated sxs/prior Treatment) HPI Complains of pain at left great toe onset 03/17/2015 typical of gout he's had in the past. He was seen here on 03/19/2015 prescribed indomethacin and Vicodin. States swelling has improved however he has run out of Vicodin. He has continued pain however. Pain is worse with putting on his shoe her pressure improved with no pressure on the area. No other associated symptoms no fever no trauma. Past Medical History  Diagnosis Date  . Hypertension   . GERD (gastroesophageal reflux disease)   . Hypertension   . Gout    Past Surgical History  Procedure Laterality Date  . Negative     No family history on file. Social History  Substance Use Topics  . Smoking status: Never Smoker   . Smokeless tobacco: Never Used  . Alcohol Use: No     Comment: former    Review of Systems  Constitutional: Negative.   Musculoskeletal: Positive for arthralgias.       Left great toe pain      Allergies  Review of patient's allergies indicates no known allergies.  Home Medications   Prior to Admission medications   Medication Sig Start Date End Date Taking? Authorizing Provider  escitalopram (LEXAPRO) 10 MG tablet Take 1 tablet (10 mg total) by mouth daily. 02/27/15   Waldon MerlWilliam C Martin, PA-C  esomeprazole (NEXIUM) 20 MG capsule Take 2 capsules (40 mg total) by mouth every morning. 11/02/13   Benjiman CoreNathan Pickering, MD  HYDROcodone-acetaminophen (NORCO/VICODIN) 5-325 MG tablet Take 1-2 tablets by mouth every 6 (six) hours as needed. 03/19/15   Roxy Horsemanobert Browning, PA-C  indomethacin (INDOCIN) 25 MG capsule Take 1 capsule (25 mg total) by mouth 3 (three) times daily as needed. 03/19/15   Roxy Horsemanobert Browning, PA-C  lisinopril-hydrochlorothiazide  (PRINZIDE,ZESTORETIC) 20-12.5 MG tablet Take 1 tablet by mouth daily. 02/12/15   Waldon MerlWilliam C Martin, PA-C   BP 135/92 mmHg  Pulse 95  Temp(Src) 98.2 F (36.8 C) (Oral)  Resp 18  Ht 5\' 9"  (1.753 m)  Wt 225 lb (102.059 kg)  BMI 33.21 kg/m2  SpO2 98% Physical Exam  Constitutional: He appears well-developed and well-nourished. He appears distressed.  Appears mildly accountable  HENT:  Head: Normocephalic and atraumatic.  Eyes: EOM are normal.  Neck: Neck supple.  Cardiovascular: Normal rate.   Pulmonary/Chest: Effort normal and breath sounds normal. No respiratory distress.  Musculoskeletal: Normal range of motion.  Left lower extremity skin intact tender warm and red at MTP joint of great toe. Good capillary refill. DP pulse 2+ all other extremities without pain, neurovascularly intact.  Neurological: He is alert. Coordination normal.  Skin: Skin is warm and dry. No rash noted.  Psychiatric: He has a normal mood and affect.  Nursing note and vitals reviewed.   ED Course  Procedures (including critical care time) Labs Review Labs Reviewed - No data to display  Imaging Review No results found. I have personally reviewed and evaluated these images and lab results as part of my medical decision-making.   EKG Interpretation None      MDM  Patient has appointment with PMD in 2 days. Will prescribe 16 Norco tablets which should last until his appointment. He has adequate indomethacin he reports Final diagnoses:  None  diagnosis gouty arthropathy of left great toe      Doug Sou, MD 03/27/15 1513

## 2015-03-30 ENCOUNTER — Telehealth: Payer: Self-pay | Admitting: Physician Assistant

## 2015-03-30 NOTE — Telephone Encounter (Signed)
Left message on VM for patient to call office about Flu Shot

## 2015-04-17 ENCOUNTER — Telehealth: Payer: Self-pay | Admitting: *Deleted

## 2015-04-17 ENCOUNTER — Telehealth: Payer: Self-pay | Admitting: Physician Assistant

## 2015-04-17 NOTE — Telephone Encounter (Signed)
There is no notation in patient's chart that Shanda BumpsJessica has received this paperwork and patient should have been informed that provider is out of office until Tuesday, April 23, 2014; please call patient back with this information/SLS Thanks.

## 2015-04-17 NOTE — Telephone Encounter (Addendum)
Patient was made aware at the time of the call provider was out of the office. Patient informed documents were not received and patient states will contact [employer] and will have documents re-fax [phone]

## 2015-04-17 NOTE — Telephone Encounter (Signed)
Received Short Term Disability Claim forms for patient; pt advised provider is out of office until Tuesday, April 24, 2015/SLS

## 2015-04-17 NOTE — Telephone Encounter (Signed)
Relation to GN:FAOZpt:self Call back number: [phone]    Reason for call:  Patient checking on the status of short term disability from [employer] / [employer] patient states paperwork faxed over last week and states he's not getting paid until the paperwork is filled out and faxed, patient states he would like a follow up call regarding the status and also stated this is extremely important do to not receiving any income.

## 2015-04-18 NOTE — Telephone Encounter (Signed)
Pt is looking to have his next procedure on 1/17 and return to work on 1/30. Pt would like to have this form completed as soon as possible. Once form is complete pt would like to be called to have form faxed to : [phone] and called at: [phone] for confirmation.    Thanks.

## 2015-04-18 NOTE — Telephone Encounter (Signed)
John Simmons at 04/18/2015 2:36 PM    Status: Signed      Expand All Collapse All   Pt is looking to have his next procedure on 1/17 and return to work on 1/30. Pt would like to have this form completed as soon as possible. Once form is complete pt would like to be called to have form faxed to : [phone] and called at: [phone] for confirmation.

## 2015-04-19 NOTE — Telephone Encounter (Signed)
This form has not been received.

## 2015-04-19 NOTE — Telephone Encounter (Signed)
Please verify paperwork has been received. I will not be back in office until 04/24/15 as I am out of state. The earliest it would be completed would be the 3rd but we will call him.

## 2015-04-20 NOTE — Telephone Encounter (Signed)
Regis BillSharon L Katalyn Matin, CMA at 04/17/2015 3:00 PM     Status: Signed       Expand All Collapse All   Received Short Term Disability Claim forms for patient; pt advised provider is out of office until Tuesday, April 24, 2015/SLS

## 2015-04-24 NOTE — Telephone Encounter (Signed)
The only thing received (patient brought in) is a form he was supposed to complete. I have not seen patient since November so I am not sure what the disability being requested is for. I had filled out a work Doctor, general practiceaccomodation form a couple of months ago. Maybe this is what the disability is regarding? Please call and assess. I see he has been in the ER several times for gout. If he is requesting disability for this I cannot help as I have not seen patient.

## 2015-04-24 NOTE — Telephone Encounter (Signed)
Provider reviewed requested forms for Physician Certification, but we only received Restriction Disability form; informed that pt's Gastroenterologist who performed his hemorrhoid banding and colon polyp removal, and patient will be following up with on 05/02/15 will have to handle his STD RE: procedures listed in note and follow-up management from this procedure; pt informed to call Insurance and give them physician's information so they can get paperwork out to their office today via fax; patient understood & agreed, forms sent to shred/SLS

## 2015-04-24 NOTE — Telephone Encounter (Addendum)
Provider gave forms back, as they are for the Claimant to fill out; no provider certification papers and patient not been seen in the requested timeframe of the paperwork [last OV wit PCP 02/27/15]; Patient informed, understood & agreed to call his STD Insurance and have them fax over paperwork for provider to review, as this may needed to be forwarded to GI [did hemorrhoid banding on polyp removal 04/04/15], and patient has ROV scheduled on 05/02/15.SLS

## 2015-05-16 ENCOUNTER — Telehealth: Payer: Self-pay | Admitting: Physician Assistant

## 2015-05-16 MED ORDER — LISINOPRIL-HYDROCHLOROTHIAZIDE 20-12.5 MG PO TABS: 1.0000 | ORAL_TABLET | Freq: Every day | ORAL | Status: DC

## 2015-05-16 NOTE — Telephone Encounter (Signed)
Refill done #30 day

## 2015-05-16 NOTE — Telephone Encounter (Signed)
[facility] - [place], [place] - [address]  Pt called stating he is out of lisinopril. He said the pharmacy contacted [place] yesterday and told him we had not responded.

## 2015-05-16 NOTE — Addendum Note (Signed)
Addended by: Scharlene Gloss B on: 05/16/2015 10:35 AM   Modules accepted: Orders

## 2015-05-16 NOTE — Telephone Encounter (Signed)
Sent in refill on BP meds. Per provider instructions.

## 2015-05-16 NOTE — Addendum Note (Signed)
Addended by: Scharlene Gloss B on: 05/16/2015 01:07 PM   Modules accepted: Orders

## 2015-05-19 ENCOUNTER — Emergency Department (HOSPITAL_BASED_OUTPATIENT_CLINIC_OR_DEPARTMENT_OTHER)
Admission: EM | Admit: 2015-05-19 | Discharge: 2015-05-19 | Disposition: A | Discharge status: Home / Self Care | Payer: BLUE CROSS/BLUE SHIELD | Attending: Emergency Medicine | Admitting: Emergency Medicine

## 2015-05-19 ENCOUNTER — Encounter (HOSPITAL_BASED_OUTPATIENT_CLINIC_OR_DEPARTMENT_OTHER): Payer: Self-pay | Admitting: *Deleted

## 2015-05-19 DIAGNOSIS — M10072 Idiopathic gout, left ankle and foot: Secondary | ICD-10-CM | POA: Diagnosis not present

## 2015-05-19 DIAGNOSIS — I10 Essential (primary) hypertension: Secondary | ICD-10-CM | POA: Insufficient documentation

## 2015-05-19 DIAGNOSIS — K219 Gastro-esophageal reflux disease without esophagitis: Secondary | ICD-10-CM | POA: Insufficient documentation

## 2015-05-19 DIAGNOSIS — M109 Gout, unspecified: Secondary | ICD-10-CM

## 2015-05-19 DIAGNOSIS — Z79899 Other long term (current) drug therapy: Secondary | ICD-10-CM | POA: Insufficient documentation

## 2015-05-19 MED ORDER — INDOMETHACIN 25 MG PO CAPS: 25.0000 mg | ORAL_CAPSULE | Freq: Three times a day (TID) | ORAL | Status: DC | PRN

## 2015-05-19 MED ORDER — KETOROLAC TROMETHAMINE 60 MG/2ML IM SOLN
60.0000 mg | Freq: Once | INTRAMUSCULAR | Status: AC
  Administered 2015-05-19: 60 mg via INTRAMUSCULAR
  Filled 2015-05-19: qty 2

## 2015-05-19 MED ORDER — HYDROCODONE-ACETAMINOPHEN 5-325 MG PO TABS: 1.0000 | ORAL_TABLET | Freq: Four times a day (QID) | ORAL | Status: DC | PRN

## 2015-05-19 NOTE — Discharge Instructions (Signed)
Please take your medications as prescribed. Follow-up with your doctor for further evaluation and management of your symptoms as well as medication reconciliation. Return to ED for worsening symptoms.  Gout Gout is an inflammatory arthritis caused by a buildup of uric acid crystals in the joints. Uric acid is a chemical that is normally present in the blood. When the level of uric acid in the blood is too high it can form crystals that deposit in your joints and tissues. This causes joint redness, soreness, and swelling (inflammation). Repeat attacks are common. Over time, uric acid crystals can form into masses (tophi) near a joint, destroying bone and causing disfigurement. Gout is treatable and often preventable. CAUSES  The disease begins with elevated levels of uric acid in the blood. Uric acid is produced by your body when it breaks down a naturally found substance called purines. Certain foods you eat, such as meats and fish, contain high amounts of purines. Causes of an elevated uric acid level include:  Being passed down from parent to child (heredity).  Diseases that cause increased uric acid production (such as obesity, psoriasis, and certain cancers).  Excessive alcohol use.  Diet, especially diets rich in meat and seafood.  Medicines, including certain cancer-fighting medicines (chemotherapy), water pills (diuretics), and aspirin.  Chronic kidney disease. The kidneys are no longer able to remove uric acid well.  Problems with metabolism. Conditions strongly associated with gout include:  Obesity.  High blood pressure.  High cholesterol.  Diabetes. Not everyone with elevated uric acid levels gets gout. It is not understood why some people get gout and others do not. Surgery, joint injury, and eating too much of certain foods are some of the factors that can lead to gout attacks. SYMPTOMS   An attack of gout comes on quickly. It causes intense pain with redness, swelling,  and warmth in a joint.  Fever can occur.  Often, only one joint is involved. Certain joints are more commonly involved:  Base of the big toe.  Knee.  Ankle.  Wrist.  Finger. Without treatment, an attack usually goes away in a few days to weeks. Between attacks, you usually will not have symptoms, which is different from many other forms of arthritis. DIAGNOSIS  Your caregiver will suspect gout based on your symptoms and exam. In some cases, tests may be recommended. The tests may include:  Blood tests.  Urine tests.  X-rays.  Joint fluid exam. This exam requires a needle to remove fluid from the joint (arthrocentesis). Using a microscope, gout is confirmed when uric acid crystals are seen in the joint fluid. TREATMENT  There are two phases to gout treatment: treating the sudden onset (acute) attack and preventing attacks (prophylaxis).  Treatment of an Acute Attack.  Medicines are used. These include anti-inflammatory medicines or steroid medicines.  An injection of steroid medicine into the affected joint is sometimes necessary.  The painful joint is rested. Movement can worsen the arthritis.  You may use warm or cold treatments on painful joints, depending which works best for you.  Treatment to Prevent Attacks.  If you suffer from frequent gout attacks, your caregiver may advise preventive medicine. These medicines are started after the acute attack subsides. These medicines either help your kidneys eliminate uric acid from your body or decrease your uric acid production. You may need to stay on these medicines for a very long time.  The early phase of treatment with preventive medicine can be associated with an increase in acute  gout attacks. For this reason, during the first few months of treatment, your caregiver may also advise you to take medicines usually used for acute gout treatment. Be sure you understand your caregiver's directions. Your caregiver may make  several adjustments to your medicine dose before these medicines are effective.  Discuss dietary treatment with your caregiver or dietitian. Alcohol and drinks high in sugar and fructose and foods such as meat, poultry, and seafood can increase uric acid levels. Your caregiver or dietitian can advise you on drinks and foods that should be limited. HOME CARE INSTRUCTIONS   Do not take aspirin to relieve pain. This raises uric acid levels.  Only take over-the-counter or prescription medicines for pain, discomfort, or fever as directed by your caregiver.  Rest the joint as much as possible. When in bed, keep sheets and blankets off painful areas.  Keep the affected joint raised (elevated).  Apply warm or cold treatments to painful joints. Use of warm or cold treatments depends on which works best for you.  Use crutches if the painful joint is in your leg.  Drink enough fluids to keep your urine clear or pale yellow. This helps your body get rid of uric acid. Limit alcohol, sugary drinks, and fructose drinks.  Follow your dietary instructions. Pay careful attention to the amount of protein you eat. Your daily diet should emphasize fruits, vegetables, whole grains, and fat-free or low-fat milk products. Discuss the use of coffee, vitamin C, and cherries with your caregiver or dietitian. These may be helpful in lowering uric acid levels.  Maintain a healthy body weight. SEEK MEDICAL CARE IF:   You develop diarrhea, vomiting, or any side effects from medicines.  You do not feel better in 24 hours, or you are getting worse. SEEK IMMEDIATE MEDICAL CARE IF:   Your joint becomes suddenly more tender, and you have chills or a fever. MAKE SURE YOU:   Understand these instructions.  Will watch your condition.  Will get help right away if you are not doing well or get worse.   This information is not intended to replace advice given to you by your health care provider. Make sure you discuss  any questions you have with your health care provider.   Document Released: 04/04/2000 Document Revised: 04/28/2014 Document Reviewed: 11/19/2011 Elsevier Interactive Patient Education 2016 [employer]  Low-Purine Diet Purines are compounds that affect the level of uric acid in your body. A low-purine diet is a diet that is low in purines. Eating a low-purine diet can prevent the level of uric acid in your body from getting too high and causing gout or kidney stones or both. WHAT DO I NEED TO KNOW ABOUT THIS DIET?  Choose low-purine foods. Examples of low-purine foods are listed in the next section.  Drink plenty of fluids, especially water. Fluids can help remove uric acid from your body. Try to drink 8-16 cups (1.9-3.8 L) a day.  Limit foods high in fat, especially saturated fat, as fat makes it harder for the body to get rid of uric acid. Foods high in saturated fat include pizza, cheese, ice cream, whole milk, fried foods, and gravies. Choose foods that are lower in fat and lean sources of protein. Use olive oil when cooking as it contains healthy fats that are not high in saturated fat.  Limit alcohol. Alcohol interferes with the elimination of uric acid from your body. If you are having a gout attack, avoid all alcohol.  Keep in mind that  different people's bodies react differently to different foods. You will probably learn over time which foods do or do not affect you. If you discover that a food tends to cause your gout to flare up, avoid eating that food. You can more freely enjoy foods that do not cause problems. If you have any questions about a food item, talk to your dietitian or health care provider. WHICH FOODS ARE LOW, MODERATE, AND HIGH IN PURINES? The following is a list of foods that are low, moderate, and high in purines. You can eat any amount of the foods that are low in purines. You may be able to have small amounts of foods that are moderate in purines. Ask your health  care provider how much of a food moderate in purines you can have. Avoid foods high in purines. Grains  Foods low in purines: Enriched white bread, pasta, rice, cake, cornbread, popcorn.  Foods moderate in purines: Whole-grain breads and cereals, wheat germ, bran, oatmeal. Uncooked oatmeal. Dry wheat bran or wheat germ.  Foods high in purines: Pancakes, [place] toast, biscuits, muffins. Vegetables  Foods low in purines: All vegetables, except those that are moderate in purines.  Foods moderate in purines: Asparagus, cauliflower, spinach, mushrooms, green peas. Fruits  All fruits are low in purines. Meats and other Protein Foods  Foods low in purines: Eggs, nuts, peanut butter.  Foods moderate in purines: 80-90% lean beef, lamb, veal, pork, poultry, fish, eggs, peanut butter, nuts. Crab, lobster, oysters, and shrimp. Cooked dried beans, peas, and lentils.  Foods high in purines: Anchovies, sardines, herring, mussels, tuna, codfish, scallops, trout, and haddock. John Simmons. Organ meats (such as liver or kidney). Tripe. Game meat. Goose. Sweetbreads. Dairy  All dairy foods are low in purines. Low-fat and fat-free dairy products are best because they are low in saturated fat. Beverages  Drinks low in purines: Water, carbonated beverages, tea, coffee, cocoa.  Drinks moderate in purines: Soft drinks and other drinks sweetened with high-fructose corn syrup. Juices. To find whether a food or drink is sweetened with high-fructose corn syrup, look at the ingredients list.  Drinks high in purines: Alcoholic beverages (such as beer). Condiments  Foods low in purines: Salt, herbs, olives, pickles, relishes, vinegar.  Foods moderate in purines: Butter, margarine, oils, mayonnaise. Fats and Oils  Foods low in purines: All types, except gravies and sauces made with meat.  Foods high in purines: Gravies and sauces made with meat. Other Foods  Foods low in purines: Sugars, sweets, gelatin. Cake.  Soups made without meat.  Foods moderate in purines: Meat-based or fish-based soups, broths, or bouillons. Foods and drinks sweetened with high-fructose corn syrup.  Foods high in purines: High-fat desserts (such as ice cream, cookies, cakes, pies, doughnuts, and chocolate). Contact your dietitian for more information on foods that are not listed here.   This information is not intended to replace advice given to you by your health care provider. Make sure you discuss any questions you have with your health care provider.   Document Released: 08/02/2010 Document Revised: 04/12/2013 Document Reviewed: 03/14/2013 Elsevier Interactive Patient Education [employer]

## 2015-05-19 NOTE — ED Provider Notes (Signed)
CSN: [id number]     Arrival date & time 05/19/15  1508 History   First MD Initiated Contact with Patient 05/19/15 1526     Chief Complaint  Patient presents with  . Gout     (Consider location/radiation/quality/duration/timing/severity/associated sxs/prior Treatment) HPI John Simmons is a 40 y.o. male with a history of hypertension, gout, comes in for evaluation of acute gout flare. Patient reports he typically will get gout in his left great toe. Current episode started 4 days ago and he complains of progressively worsening left toe pain with associated redness and swelling. He typically takes indomethacin for his pain, but ran out of this medication in November and has not taken anything for his current pain. Denies any fevers, chills, other foot pain, knee pain, other injury or trauma.  Past Medical History  Diagnosis Date  . Hypertension   . GERD (gastroesophageal reflux disease)   . Hypertension   . Gout    Past Surgical History  Procedure Laterality Date  . Negative     History reviewed. No pertinent family history. Social History  Substance Use Topics  . Smoking status: Never Smoker   . Smokeless tobacco: Never Used  . Alcohol Use: Yes     Comment: former    Review of Systems A 10 point review of systems was completed and was negative except for pertinent positives and negatives as mentioned in the history of present illness     Allergies  Review of patient's allergies indicates no known allergies.  Home Medications   Prior to Admission medications   Medication Sig Start Date End Date Taking? Authorizing Provider  esomeprazole (NEXIUM) 20 MG capsule Take 2 capsules (40 mg total) by mouth every morning. 11/02/13   Benjiman Core, MD  HYDROcodone-acetaminophen (NORCO/VICODIN) 5-325 MG tablet Take 1-2 tablets by mouth every 6 (six) hours as needed. 05/19/15   Joycie Peek, PA-C  indomethacin (INDOCIN) 25 MG capsule Take 1 capsule (25 mg total) by mouth 3  (three) times daily as needed. 05/19/15   Joycie Peek, PA-C  lisinopril-hydrochlorothiazide (PRINZIDE,ZESTORETIC) 20-12.5 MG tablet Take 1 tablet by mouth daily. 05/16/15   Waldon Merl, PA-C   BP 114/77 mmHg  Pulse 93  Temp(Src) 97.9 F (36.6 C) (Oral)  Resp 18  Ht  (1.753 m)  Wt 99.791 kg  BMI 32.47 kg/m2  SpO2 100% Physical Exam  Constitutional:  Awake, alert, nontoxic appearance. Overall well-appearing African-American male  HENT:  Head: Atraumatic.  Eyes: Right eye exhibits no discharge. Left eye exhibits no discharge.  Neck: Neck supple.  Pulmonary/Chest: Effort normal. He exhibits no tenderness.  Abdominal: Soft. There is no tenderness. There is no rebound.  Musculoskeletal: He exhibits no tenderness.  Baseline ROM, no obvious new focal weakness. Left great toe MTP: Mild erythema and swelling. Tenderness to palpation. No tracking. Distal pulses intact with brisk cap refill.  Neurological:  Mental status and motor strength appears baseline for patient and situation.  Skin: No rash noted.  Psychiatric: He has a normal mood and affect.  Nursing note and vitals reviewed.   ED Course  Procedures (including critical care time) Labs Review Labs Reviewed - No data to display  Imaging Review No results found. I have personally reviewed and evaluated these images and lab results as part of my medical decision-making.   EKG Interpretation None     Meds given in ED:  Medications  ketorolac (TORADOL) injection 60 mg (60 mg Intramuscular Given 05/19/15 1544)    Discharge  Medication List as of 05/19/2015  3:46 PM     Filed Vitals:   05/19/15 1512  BP: 114/77  Pulse: 93  Temp: 97.9 F (36.6 C)  TempSrc: Oral  Resp: 18  Height:  (1.753 m)  Weight: 99.791 kg  SpO2: 100%    MDM  John Simmons is a 40 y.o. male with history of gout comes in for acute gouty flare. Ran out of gout medications in November. Plan to treat with NSAIDs in the emergency  department, discharged with prescription for indomethacin and short course pain medicines. Encouraged follow-up with PCP. He verbalizes understanding and agrees with this plan. No evidence of septic joint or other acute or emergent pathology at this time. Hemodynamically stable and afebrile, Stable for discharge. Final diagnoses:  Gout of left foot, unspecified cause, unspecified chronicity       Joycie Peek, PA-C 05/19/15 1659  Jerelyn Scott, MD 05/19/15 1700

## 2015-05-19 NOTE — ED Notes (Signed)
Patient is alert and oriented x4.  He is complaining of left foot pain that started 2 days ago.   Patient states he has a history of gout.  Currently he rates his pain 9 of 10.

## 2015-06-15 ENCOUNTER — Telehealth: Payer: Self-pay | Admitting: Physician Assistant

## 2015-06-15 ENCOUNTER — Ambulatory Visit: Payer: BLUE CROSS/BLUE SHIELD | Admitting: Physician Assistant

## 2015-06-15 NOTE — Telephone Encounter (Signed)
Pt lvm at 10:19 cancelling his appt. He says that he will not be able to make his appt today.

## 2015-06-15 NOTE — Telephone Encounter (Signed)
Has history of cancellation and no-shows. Charge.

## 2015-06-18 ENCOUNTER — Encounter: Payer: Self-pay | Admitting: Physician Assistant

## 2015-06-18 ENCOUNTER — Encounter (HOSPITAL_BASED_OUTPATIENT_CLINIC_OR_DEPARTMENT_OTHER): Payer: Self-pay | Admitting: *Deleted

## 2015-06-18 ENCOUNTER — Emergency Department (HOSPITAL_BASED_OUTPATIENT_CLINIC_OR_DEPARTMENT_OTHER)
Admission: EM | Admit: 2015-06-18 | Discharge: 2015-06-18 | Disposition: A | Discharge status: Home / Self Care | Payer: BLUE CROSS/BLUE SHIELD | Attending: Emergency Medicine | Admitting: Emergency Medicine

## 2015-06-18 DIAGNOSIS — R200 Anesthesia of skin: Secondary | ICD-10-CM | POA: Diagnosis present

## 2015-06-18 DIAGNOSIS — K219 Gastro-esophageal reflux disease without esophagitis: Secondary | ICD-10-CM | POA: Insufficient documentation

## 2015-06-18 DIAGNOSIS — Z79899 Other long term (current) drug therapy: Secondary | ICD-10-CM | POA: Insufficient documentation

## 2015-06-18 DIAGNOSIS — G5622 Lesion of ulnar nerve, left upper limb: Secondary | ICD-10-CM | POA: Diagnosis not present

## 2015-06-18 DIAGNOSIS — Z8739 Personal history of other diseases of the musculoskeletal system and connective tissue: Secondary | ICD-10-CM | POA: Insufficient documentation

## 2015-06-18 DIAGNOSIS — I10 Essential (primary) hypertension: Secondary | ICD-10-CM | POA: Insufficient documentation

## 2015-06-18 NOTE — ED Notes (Signed)
Woke this am with numbness in his right hand. No pain. No radiation. Neuro intact.

## 2015-06-18 NOTE — Telephone Encounter (Signed)
Marked to charge, mailing no show letter °

## 2015-06-18 NOTE — ED Provider Notes (Signed)
CSN: [id number]     Arrival date & time 06/18/15  1048 History   First MD Initiated Contact with Patient 06/18/15 1121     Chief Complaint  Patient presents with  . Numbness      HPI Patient awoke this morning with numbness and tingling of his left little finger and left ring finger.  He states that he did drink alcohol last night at a party and slept very heavily.  He awoke with the symptoms today.  He denies weakness in his left hand.  He reports no pain at his left elbow.  He reports no wrist drop of the left wrist.  No numbness or tingling of his left lower extremity.  Grip strength feels normal to him.  No other complaints.   Past Medical History  Diagnosis Date  . Hypertension   . GERD (gastroesophageal reflux disease)   . Hypertension   . Gout    Past Surgical History  Procedure Laterality Date  . Negative     No family history on file. Social History  Substance Use Topics  . Smoking status: Never Smoker   . Smokeless tobacco: Never Used  . Alcohol Use: Yes     Comment: former    Review of Systems  All other systems reviewed and are negative.     Allergies  Review of patient's allergies indicates no known allergies.  Home Medications   Prior to Admission medications   Medication Sig Start Date End Date Taking? Authorizing Provider  esomeprazole (NEXIUM) 20 MG capsule Take 2 capsules (40 mg total) by mouth every morning. 11/02/13   Benjiman Core, MD  HYDROcodone-acetaminophen (NORCO/VICODIN) 5-325 MG tablet Take 1-2 tablets by mouth every 6 (six) hours as needed. 05/19/15   Joycie Peek, PA-C  indomethacin (INDOCIN) 25 MG capsule Take 1 capsule (25 mg total) by mouth 3 (three) times daily as needed. 05/19/15   Joycie Peek, PA-C  lisinopril-hydrochlorothiazide (PRINZIDE,ZESTORETIC) 20-12.5 MG tablet Take 1 tablet by mouth daily. 05/16/15   Waldon Merl, PA-C   BP 146/103 mmHg  Pulse 111  Temp(Src) 97.9 F (36.6 C) (Oral)  Resp 20  Ht   (1.753 m)  Wt 220 lb (99.791 kg)  BMI 32.47 kg/m2  SpO2 100% Physical Exam  Constitutional: He is oriented to person, place, and time. He appears well-developed and well-nourished.  HENT:  Head: Normocephalic.  Eyes: EOM are normal.  Neck: Normal range of motion.  Pulmonary/Chest: Effort normal.  Abdominal: He exhibits no distension.  Musculoskeletal: Normal range of motion.  No swelling of the left upper extremity.  Normal left radial and ulnar pulses.  Normal grip strength of the left hand.  No swelling or skin changes noted of the left hand.  No tenderness at the left medial epicondyle  Neurological: He is alert and oriented to person, place, and time.  Psychiatric: He has a normal mood and affect.  Nursing note and vitals reviewed.   ED Course  Procedures (including critical care time) Labs Review Labs Reviewed - No data to display  Imaging Review No results found. I have personally reviewed and evaluated these images and lab results as part of my medical decision-making.   EKG Interpretation None      MDM   Final diagnoses:  Ulnar neuropathy at wrist, left    Suspect isolated mononeuropathy and neuropathic associated pain.  Discharge home in good condition.  Doubt stroke    Azalia Bilis, MD 06/18/15 1159

## 2015-06-22 ENCOUNTER — Encounter (HOSPITAL_BASED_OUTPATIENT_CLINIC_OR_DEPARTMENT_OTHER): Payer: Self-pay | Admitting: Emergency Medicine

## 2015-06-22 ENCOUNTER — Emergency Department (HOSPITAL_BASED_OUTPATIENT_CLINIC_OR_DEPARTMENT_OTHER)
Admission: EM | Admit: 2015-06-22 | Discharge: 2015-06-22 | Disposition: A | Discharge status: Home / Self Care | Payer: BLUE CROSS/BLUE SHIELD | Attending: Physician Assistant | Admitting: Physician Assistant

## 2015-06-22 DIAGNOSIS — R42 Dizziness and giddiness: Secondary | ICD-10-CM | POA: Insufficient documentation

## 2015-06-22 DIAGNOSIS — J3489 Other specified disorders of nose and nasal sinuses: Secondary | ICD-10-CM | POA: Insufficient documentation

## 2015-06-22 DIAGNOSIS — R5383 Other fatigue: Secondary | ICD-10-CM | POA: Insufficient documentation

## 2015-06-22 DIAGNOSIS — I10 Essential (primary) hypertension: Secondary | ICD-10-CM | POA: Diagnosis not present

## 2015-06-22 DIAGNOSIS — R0981 Nasal congestion: Secondary | ICD-10-CM | POA: Diagnosis not present

## 2015-06-22 DIAGNOSIS — Z79899 Other long term (current) drug therapy: Secondary | ICD-10-CM | POA: Diagnosis not present

## 2015-06-22 DIAGNOSIS — K219 Gastro-esophageal reflux disease without esophagitis: Secondary | ICD-10-CM | POA: Insufficient documentation

## 2015-06-22 DIAGNOSIS — M1A0720 Idiopathic chronic gout, left ankle and foot, without tophus (tophi): Secondary | ICD-10-CM | POA: Diagnosis not present

## 2015-06-22 MED ORDER — INDOMETHACIN 25 MG PO CAPS: 25.0000 mg | ORAL_CAPSULE | Freq: Three times a day (TID) | ORAL | Status: DC | PRN

## 2015-06-22 MED ORDER — GUAIFENESIN 100 MG/5ML PO LIQD: 100.0000 mg | ORAL | Status: DC | PRN

## 2015-06-22 NOTE — ED Provider Notes (Signed)
CSN: [id number]     Arrival date & time 06/22/15  0746 History   First MD Initiated Contact with Patient 06/22/15 [phone]     Chief Complaint  Patient presents with  . Dizziness  . Gout     (Consider location/radiation/quality/duration/timing/severity/associated sxs/prior Treatment) HPI   Patient is a 40 year old male presenting with a couple different symptoms. One symptom is that he feels a little bit lightheaded/stuffy. Patient states this started yesterday after he took some green tea. He said he did not realize a Green tea has caffeine in it. He drank an entire big thing of green tea and then felt his heart race and lightheaded. He said the lightheadedness is not really gone away. Patient was able to sleep without issue last night. Patient's had steady gait. He notes no visual changes. No headaches. He does report some stuffiness in his nose and some fatigue.   Patient also complain of gout. Patient's been here 10-12 times for similar gout-like symptoms. Patient has primary care physician located upstairs. Patient says this is his chronic gout pain.  Past Medical History  Diagnosis Date  . Hypertension   . GERD (gastroesophageal reflux disease)   . Hypertension   . Gout    Past Surgical History  Procedure Laterality Date  . Negative     No family history on file. Social History  Substance Use Topics  . Smoking status: Never Smoker   . Smokeless tobacco: Never Used  . Alcohol Use: Yes     Comment: former    Review of Systems  Constitutional: Positive for fatigue. Negative for fever and activity change.  HENT: Positive for congestion. Negative for ear pain.   Respiratory: Negative for shortness of breath.   Cardiovascular: Negative for chest pain.  Gastrointestinal: Negative for abdominal pain.  Musculoskeletal: Negative for back pain.  Neurological: Positive for dizziness and light-headedness. Negative for tremors, seizures, syncope, facial asymmetry, speech difficulty,  weakness, numbness and headaches.  Psychiatric/Behavioral: Negative for confusion.      Allergies  Review of patient's allergies indicates no known allergies.  Home Medications   Prior to Admission medications   Medication Sig Start Date End Date Taking? Authorizing Provider  esomeprazole (NEXIUM) 20 MG capsule Take 2 capsules (40 mg total) by mouth every morning. 11/02/13   Benjiman Core, MD  HYDROcodone-acetaminophen (NORCO/VICODIN) 5-325 MG tablet Take 1-2 tablets by mouth every 6 (six) hours as needed. 05/19/15   Joycie Peek, PA-C  indomethacin (INDOCIN) 25 MG capsule Take 1 capsule (25 mg total) by mouth 3 (three) times daily as needed. 05/19/15   Joycie Peek, PA-C  lisinopril-hydrochlorothiazide (PRINZIDE,ZESTORETIC) 20-12.5 MG tablet Take 1 tablet by mouth daily. 05/16/15   Waldon Merl, PA-C   BP 129/93 mmHg  Pulse 78  Temp(Src) 98.3 F (36.8 C) (Oral)  Resp 16  Ht  (1.753 m)  Wt 220 lb (99.791 kg)  BMI 32.47 kg/m2  SpO2 98% Physical Exam  Constitutional: He is oriented to person, place, and time. He appears well-nourished.  HENT:  Head: Normocephalic.  Mouth/Throat: Oropharynx is clear and moist.  Mild erythema bilateral turbinates.  Eyes: Conjunctivae are normal.  Neck: No tracheal deviation present.  Cardiovascular: Normal rate.   Pulmonary/Chest: Effort normal. No stridor. No respiratory distress.  Abdominal: Soft. There is no tenderness. There is no guarding.  Musculoskeletal: Normal range of motion. He exhibits no edema.  Pain to movement of first toe on left foot. No erythema and no swelling.  Neurological: He is oriented  to person, place, and time. No cranial nerve deficit.  Cranial nerves II through XII intact. Finger to nose intact. Patient has steady gait.  Skin: Skin is warm and dry. No rash noted. He is not diaphoretic.  Psychiatric: He has a normal mood and affect. His behavior is normal.  Nursing note and vitals reviewed.   ED  Course  Procedures (including critical care time) Labs Review Labs Reviewed - No data to display  Imaging Review No results found. I have personally reviewed and evaluated these images and lab results as part of my medical decision-making.   EKG Interpretation None      MDM   Final diagnoses:  None   Patient's 40 year old male presenting with several different symptoms. In regards to his gout patient has no external signs of inflammation. No erythema. We will give him perceptions for indomethacin and have him follow-up with his primary care physician if he needs more pain control.  As for his lightheadedness, I do not believe this is stroke or other dangerous pathology.. Patient has no risk factors for stroke. No fevers, no meningismus.  In addition patient has normal neurologic exam. I believe that what he is describing is possibly congestion leading to lightheadedness. We'll have him use decongestant, rest, drink fluids and follow-up with primary care physician as needed.  Patient had normal physical exam, normal vital signs, appears well, in no acute distress taking by mouth at time of discharge.   Karthika Glasper Randall AnLyn Chianne Byrns, MD 06/22/15 [phone]

## 2015-06-22 NOTE — Discharge Instructions (Signed)
Please return with any concerns. °

## 2015-06-22 NOTE — ED Notes (Signed)
Patient talking on cell phone, NAD noted at this time.  

## 2015-06-22 NOTE — ED Notes (Signed)
Pt states he was drinking green tea yesterday at 1 pm when he started to feel dizziness.  No other symptoms.  Pt also relates he has gout pain to left great toe several days ago.

## 2015-10-20 ENCOUNTER — Other Ambulatory Visit: Payer: Self-pay | Admitting: Physician Assistant

## 2015-10-22 NOTE — Telephone Encounter (Signed)
Notified patient of below. Patient states he will scheduled appointment at a later time

## 2015-10-22 NOTE — Telephone Encounter (Signed)
Rx request to pharmacy/SLS **NO FURTHER MEDICATION REFILL AUTHORIZATIONS WILL BE GIVEN WITHOUT PRIOR OFFICE VISIT**  Please call patient and schedule office visit prior to any further refill authorizations/SLS 07/03

## 2015-12-03 ENCOUNTER — Emergency Department (HOSPITAL_BASED_OUTPATIENT_CLINIC_OR_DEPARTMENT_OTHER)
Admission: EM | Admit: 2015-12-03 | Discharge: 2015-12-03 | Disposition: A | Discharge status: Home / Self Care | Payer: BLUE CROSS/BLUE SHIELD | Attending: Emergency Medicine | Admitting: Emergency Medicine

## 2015-12-03 ENCOUNTER — Encounter (HOSPITAL_BASED_OUTPATIENT_CLINIC_OR_DEPARTMENT_OTHER): Payer: Self-pay | Admitting: *Deleted

## 2015-12-03 ENCOUNTER — Telehealth: Payer: Self-pay | Admitting: Physician Assistant

## 2015-12-03 ENCOUNTER — Emergency Department (HOSPITAL_BASED_OUTPATIENT_CLINIC_OR_DEPARTMENT_OTHER)
Admission: EM | Admit: 2015-12-03 | Discharge: 2015-12-03 | Disposition: A | Discharge status: Home / Self Care | Payer: BLUE CROSS/BLUE SHIELD | Attending: Dermatology | Admitting: Dermatology

## 2015-12-03 DIAGNOSIS — Z5321 Procedure and treatment not carried out due to patient leaving prior to being seen by health care provider: Secondary | ICD-10-CM | POA: Insufficient documentation

## 2015-12-03 DIAGNOSIS — R42 Dizziness and giddiness: Secondary | ICD-10-CM | POA: Insufficient documentation

## 2015-12-03 DIAGNOSIS — Z760 Encounter for issue of repeat prescription: Secondary | ICD-10-CM | POA: Insufficient documentation

## 2015-12-03 DIAGNOSIS — I10 Essential (primary) hypertension: Secondary | ICD-10-CM | POA: Insufficient documentation

## 2015-12-03 DIAGNOSIS — Z79899 Other long term (current) drug therapy: Secondary | ICD-10-CM | POA: Insufficient documentation

## 2015-12-03 HISTORY — DX: Other chronic pain: G89.29

## 2015-12-03 HISTORY — DX: Cervicalgia: M54.2

## 2015-12-03 MED ORDER — LISINOPRIL-HYDROCHLOROTHIAZIDE 20-12.5 MG PO TABS: 1.0000 | ORAL_TABLET | Freq: Every day | ORAL | 0 refills | Status: DC

## 2015-12-03 NOTE — ED Triage Notes (Signed)
He was here this am requesting med refill for BP medication. He had to leave before being seen. He is now off work and has time to wait to be seen.

## 2015-12-03 NOTE — Telephone Encounter (Signed)
Pt says that he just started a new job and so his insurance hasn't sent him his card. Pt would like to know if provider could supply him with a few week supply until?     [facility] [address] [facility] [place], [place] - [id number]   CB: [phone]  Pt says that he has been out for 2 days.  Please advise.

## 2015-12-03 NOTE — Telephone Encounter (Signed)
Rx request Denied, pt last OV 03/07/2015, informed last month when given 30-day supply that he would receive no further Rx refill authorizations w/o a prior OV; pt stated he would call back later to schedule appointment, no future appointment shceduled/SLS 08/14

## 2015-12-03 NOTE — ED Notes (Signed)
States has been out of b/p meds x 3 days .  Wants an Rx

## 2015-12-03 NOTE — ED Triage Notes (Signed)
He ran out of his BP medication 3 days ago. States he is only here to get a RX for his medication. He cant see a doctor for 2 weeks due to insurance.

## 2015-12-03 NOTE — ED Provider Notes (Signed)
[facility]   CSN: [id number]   By signing my name below, I, Phillis HaggisGabriella Gaje, attest that this documentation has been prepared under the direction and in the presence of Fayrene HelperBowie Clearance Chenault, PA-C. Electronically Signed: Phillis HaggisGabriella Gaje, ED Scribe. 12/03/15. 11:06 PM.  Arrival date & time: 12/03/15  2111  History   Chief Complaint Chief Complaint  Patient presents with  . Medication Refill   The history is provided by the patient. No language interpreter was used.   HPI Comments: John Simmons is a 40 y.o. Male with a hx of HTN who presents to the Emergency Department requesting a medication refill. Pt was in the ED earlier this morning requesting a refill for his BP medication, but left before being seen. He has been out of the 20-12.5 lisinopril-hydrochlorothiazide for 3 days. He reports associated intermittent lightheadedness. He has a PCP but has been unable to see them because of his insurance. He denies visual changes, chest pain, SOB, or headaches.   Past Medical History:  Diagnosis Date  . GERD (gastroesophageal reflux disease)   . Gout   . Hypertension   . Hypertension     Patient Active Problem List   Diagnosis Date Noted  . Anxiety and depression 02/27/2015  . Essential hypertension, benign 03/08/2014  . GERD (gastroesophageal reflux disease) 03/08/2014    Past Surgical History:  Procedure Laterality Date  . negative     Home Medications    Prior to Admission medications   Medication Sig Start Date End Date Taking? Authorizing Provider  esomeprazole (NEXIUM) 20 MG capsule Take 2 capsules (40 mg total) by mouth every morning. 11/02/13   Benjiman CoreNathan Pickering, MD  guaiFENesin (ROBITUSSIN) 100 MG/5ML liquid Take 5-10 mLs (100-200 mg total) by mouth every 4 (four) hours as needed for congestion. 06/22/15   Courteney Lyn Mackuen, MD  HYDROcodone-acetaminophen (NORCO/VICODIN) 5-325 MG tablet Take 1-2 tablets by mouth every 6 (six) hours as needed. 05/19/15    Joycie PeekBenjamin Cartner, PA-C  indomethacin (INDOCIN) 25 MG capsule Take 1 capsule (25 mg total) by mouth 3 (three) times daily as needed. 06/22/15   Courteney Lyn Mackuen, MD  lisinopril-hydrochlorothiazide (PRINZIDE,ZESTORETIC) 20-12.5 MG tablet TAKE ONE TABLET BY MOUTH ONCE DAILY 10/22/15   Waldon MerlWilliam C Martin, PA-C    Family History No family history on file.  Social History Social History  Substance Use Topics  . Smoking status: Never Smoker  . Smokeless tobacco: Never Used  . Alcohol use Yes     Comment: former   Allergies   Review of patient's allergies indicates no known allergies.   Review of Systems Review of Systems  Eyes: Negative for visual disturbance.  Respiratory: Negative for shortness of breath.   Cardiovascular: Negative for chest pain.  Neurological: Positive for light-headedness. Negative for headaches.   Physical Exam Updated Vital Signs BP 131/99   Pulse 64   Temp 97.9 F (36.6 C) (Oral)   Resp 16   Ht 5\' 9"  (1.753 m)   Wt 215 lb (97.5 kg)   SpO2 98%   BMI 31.75 kg/m   Physical Exam  Constitutional: He is oriented to person, place, and time. He appears well-developed and well-nourished.  HENT:  Head: Normocephalic and atraumatic.  Mouth/Throat: Oropharynx is clear and moist.  Eyes: Conjunctivae and EOM are normal. Pupils are equal, round, and reactive to light.  Neck: Normal range of motion. Neck supple.  Cardiovascular: Normal rate and regular rhythm.   Pulmonary/Chest: Effort normal.  Abdominal: Soft. There is  no tenderness.  Musculoskeletal: Normal range of motion.  Neurological: He is alert and oriented to person, place, and time.  Skin: Skin is warm and dry.  Psychiatric: He has a normal mood and affect. His behavior is normal.  Nursing note and vitals reviewed.    ED Treatments / Results  DIAGNOSTIC STUDIES: Oxygen Saturation is 98% on RA, normal by my interpretation.    COORDINATION OF CARE: 11:06 PM-Discussed treatment plan which  includes medication refill with pt at bedside and pt agreed to plan.    Labs (all labs ordered are listed, but only abnormal results are displayed) Labs Reviewed - No data to display  EKG  EKG Interpretation None       Radiology No results found.  Procedures Procedures (including critical care time)  Medications Ordered in ED Medications - No data to display   Initial Impression / Assessment and Plan / ED Course  I have reviewed the triage vital signs and the nursing notes.  Pertinent labs & imaging results that were available during my care of the patient were reviewed by me and considered in my medical decision making (see chart for details).  Clinical Course    BP 131/99   Pulse 64   Temp 97.9 F (36.6 C) (Oral)   Resp 16   Ht 5\' 9"  (1.753 m)   Wt 97.5 kg   SpO2 98%   BMI 31.75 kg/m    Final Clinical Impressions(s) / ED Diagnoses   MDM: Pt here for refill of Lisinopril-Hydrochlorothiazide 20-12.5 mg. Medication is not a controlled substance. Will refill medication here. Discussed need to follow up with PCP in 2-3 days.  Pt is safe for discharge at this time.  Final diagnoses:  Encounter for medication refill  I personally performed the services described in this documentation, which was scribed in my presence. The recorded information has been reviewed and is accurate.      New Prescriptions Current Discharge Medication List       Fayrene HelperBowie Niyana Chesbro, PA-C 12/03/15 2311    Loren Raceravid Yelverton, MD 12/08/15 [phone]

## 2015-12-21 ENCOUNTER — Ambulatory Visit (INDEPENDENT_AMBULATORY_CARE_PROVIDER_SITE_OTHER): Payer: BLUE CROSS/BLUE SHIELD | Admitting: Physician Assistant

## 2015-12-21 ENCOUNTER — Encounter: Payer: Self-pay | Admitting: Physician Assistant

## 2015-12-21 VITALS — BP 106/80 | HR 82 | Temp 97.8°F | Resp 16 | Ht 68.75 in | Wt 220.2 lb

## 2015-12-21 DIAGNOSIS — M7710 Lateral epicondylitis, unspecified elbow: Secondary | ICD-10-CM | POA: Diagnosis not present

## 2015-12-21 DIAGNOSIS — I10 Essential (primary) hypertension: Secondary | ICD-10-CM | POA: Diagnosis not present

## 2015-12-21 DIAGNOSIS — Z125 Encounter for screening for malignant neoplasm of prostate: Secondary | ICD-10-CM | POA: Insufficient documentation

## 2015-12-21 DIAGNOSIS — Z0000 Encounter for general adult medical examination without abnormal findings: Secondary | ICD-10-CM | POA: Insufficient documentation

## 2015-12-21 LAB — CBC
HCT: 39.3 % (ref 39.0–52.0)
Hemoglobin: 13.6 g/dL (ref 13.0–17.0)
MCHC: 34.6 g/dL (ref 30.0–36.0)
MCV: 93.5 fl (ref 78.0–100.0)
Platelets: 276 10*3/uL (ref 150.0–400.0)
RBC: 4.2 Mil/uL — ABNORMAL LOW (ref 4.22–5.81)
RDW: 13.4 % (ref 11.5–15.5)
WBC: 3.9 10*3/uL — AB (ref 4.0–10.5)

## 2015-12-21 LAB — URINALYSIS, ROUTINE W REFLEX MICROSCOPIC
Bilirubin Urine: NEGATIVE
HGB URINE DIPSTICK: NEGATIVE
Ketones, ur: NEGATIVE
LEUKOCYTES UA: NEGATIVE
NITRITE: NEGATIVE
SPECIFIC GRAVITY, URINE: 1.025 (ref 1.000–1.030)
Total Protein, Urine: NEGATIVE
Urine Glucose: NEGATIVE
Urobilinogen, UA: 0.2 (ref 0.0–1.0)
pH: 6 (ref 5.0–8.0)

## 2015-12-21 LAB — COMPREHENSIVE METABOLIC PANEL
ALT: 15 U/L (ref 0–53)
AST: 13 U/L (ref 0–37)
Albumin: 4.2 g/dL (ref 3.5–5.2)
Alkaline Phosphatase: 62 U/L (ref 39–117)
BUN: 12 mg/dL (ref 6–23)
CO2: 30 meq/L (ref 19–32)
Calcium: 9.2 mg/dL (ref 8.4–10.5)
Chloride: 102 mEq/L (ref 96–112)
Creatinine, Ser: 1.33 mg/dL (ref 0.40–1.50)
GFR: 75.76 mL/min (ref >60.00)
GLUCOSE: 110 mg/dL — AB (ref 70–99)
Potassium: 4.2 mEq/L (ref 3.5–5.1)
Sodium: 138 mEq/L (ref 135–145)
Total Bilirubin: 0.3 mg/dL (ref 0.2–1.2)
Total Protein: 7.1 g/dL (ref 6.0–8.3)

## 2015-12-21 LAB — LIPID PANEL
CHOL/HDL RATIO: 6
Cholesterol: 234 mg/dL — ABNORMAL HIGH (ref 0–200)
HDL: 40.5 mg/dL (ref >39.00)
LDL Cholesterol: 161 mg/dL — ABNORMAL HIGH (ref 0–99)
NONHDL: 193.18
Triglycerides: 161 mg/dL — ABNORMAL HIGH (ref 0.0–149.0)
VLDL: 32.2 mg/dL (ref 0.0–40.0)

## 2015-12-21 LAB — TSH: TSH: 0.45 u[IU]/mL (ref 0.35–4.50)

## 2015-12-21 LAB — PSA: PSA: 0.36 ng/mL (ref 0.10–4.00)

## 2015-12-21 LAB — HEMOGLOBIN A1C: Hgb A1c MFr Bld: 5.8 % (ref 4.6–6.5)

## 2015-12-21 MED ORDER — MELOXICAM 15 MG PO TABS: 15.0000 mg | ORAL_TABLET | Freq: Every day | ORAL | 0 refills | Status: DC

## 2015-12-21 MED ORDER — TADALAFIL 20 MG PO TABS: 20.0000 mg | ORAL_TABLET | Freq: Every day | ORAL | 0 refills | Status: DC | PRN

## 2015-12-21 NOTE — Assessment & Plan Note (Signed)
Discussed bilateral bracing. [facility]. Supportive measures reviewed. Recommend Sports Medicine assessment if symptoms are not resolving over the next couple of weeks.

## 2015-12-21 NOTE — Assessment & Plan Note (Signed)
Depression screen negative. Health Maintenance reviewed -- Declines tetanus and flu shots. Preventive schedule discussed and handout given in AVS. Will obtain fasting labs today.

## 2015-12-21 NOTE — Assessment & Plan Note (Signed)
The natural history of prostate cancer and ongoing controversy regarding screening and potential treatment outcomes of prostate cancer has been discussed with the patient. The meaning of a false positive PSA and a false negative PSA has been discussed. He indicates understanding of the limitations of this screening test and wishes to proceed with screening PSA testing. Declines DRE. Asymptomatic. Average risk.

## 2015-12-21 NOTE — Patient Instructions (Signed)
Please go to the lab for blood work.   Our office will call you with your results unless you have chosen to receive results via MyChart.  If your blood work is normal we will follow-up each year for physicals and as scheduled for chronic medical problems.  If anything is abnormal we will treat accordingly and get you in for a follow-up.  Please take the Mobic once daily as directed with food over the next 1-2 weeks.   Get two elbow-support braces like the ones I have shown you. Wear daily.  If symptoms are not resolving we may want to consider Sports Medicine referral.  Preventive Care for Adults, Male A healthy lifestyle and preventive care can promote health and wellness. Preventive health guidelines for men include the following key practices:  A routine yearly physical is a good way to check with your health care provider about your health and preventative screening. It is a chance to share any concerns and updates on your health and to receive a thorough exam.  Visit your dentist for a routine exam and preventative care every 6 months. Brush your teeth twice a day and floss once a day. Good oral hygiene prevents tooth decay and gum disease.  The frequency of eye exams is based on your age, health, family medical history, use of contact lenses, and other factors. Follow your health care provider's recommendations for frequency of eye exams.  Eat a healthy diet. Foods such as vegetables, fruits, whole grains, low-fat dairy products, and lean protein foods contain the nutrients you need without too many calories. Decrease your intake of foods high in solid fats, added sugars, and salt. Eat the right amount of calories for you.Get information about a proper diet from your health care provider, if necessary.  Regular physical exercise is one of the most important things you can do for your health. Most adults should get at least 150 minutes of moderate-intensity exercise (any activity that  increases your heart rate and causes you to sweat) each week. In addition, most adults need muscle-strengthening exercises on 2 or more days a week.  Maintain a healthy weight. The body mass index (BMI) is a screening tool to identify possible weight problems. It provides an estimate of body fat based on height and weight. Your health care provider can find your BMI and can help you achieve or maintain a healthy weight.For adults 20 years and older:  A BMI below 18.5 is considered underweight.  A BMI of 18.5 to 24.9 is normal.  A BMI of 25 to 29.9 is considered overweight.  A BMI of 30 and above is considered obese.  Maintain normal blood lipids and cholesterol levels by exercising and minimizing your intake of saturated fat. Eat a balanced diet with plenty of fruit and vegetables. Blood tests for lipids and cholesterol should begin at age 37 and be repeated every 5 years. If your lipid or cholesterol levels are high, you are over 50, or you are at high risk for heart disease, you may need your cholesterol levels checked more frequently.Ongoing high lipid and cholesterol levels should be treated with medicines if diet and exercise are not working.  If you smoke, find out from your health care provider how to quit. If you do not use tobacco, do not start.  Lung cancer screening is recommended for adults aged 55-80 years who are at high risk for developing lung cancer because of a history of smoking. A yearly low-dose CT scan of the  lungs is recommended for people who have at least a 30-pack-year history of smoking and are a current smoker or have quit within the past 15 years. A pack year of smoking is smoking an average of 1 pack of cigarettes a day for 1 year (for example: 1 pack a day for 30 years or 2 packs a day for 15 years). Yearly screening should continue until the smoker has stopped smoking for at least 15 years. Yearly screening should be stopped for people who develop a health problem  that would prevent them from having lung cancer treatment.  If you choose to drink alcohol, do not have more than 2 drinks per day. One drink is considered to be 12 ounces (355 mL) of beer, 5 ounces (148 mL) of wine, or 1.5 ounces (44 mL) of liquor.  Avoid use of street drugs. Do not share needles with anyone. Ask for help if you need support or instructions about stopping the use of drugs.  High blood pressure causes heart disease and increases the risk of stroke. Your blood pressure should be checked at least every 1-2 years. Ongoing high blood pressure should be treated with medicines, if weight loss and exercise are not effective.  If you are 66-110 years old, ask your health care provider if you should take aspirin to prevent heart disease.  Diabetes screening is done by taking a blood sample to check your blood glucose level after you have not eaten for a certain period of time (fasting). If you are not overweight and you do not have risk factors for diabetes, you should be screened once every 3 years starting at age 26. If you are overweight or obese and you are 71-65 years of age, you should be screened for diabetes every year as part of your cardiovascular risk assessment.  Colorectal cancer can be detected and often prevented. Most routine colorectal cancer screening begins at the age of 98 and continues through age 32. However, your health care provider may recommend screening at an earlier age if you have risk factors for colon cancer. On a yearly basis, your health care provider may provide home test kits to check for hidden blood in the stool. Use of a small camera at the end of a tube to directly examine the colon (sigmoidoscopy or colonoscopy) can detect the earliest forms of colorectal cancer. Talk to your health care provider about this at age 15, when routine screening begins. Direct exam of the colon should be repeated every 5-10 years through age 37, unless early forms of precancerous  polyps or small growths are found.  People who are at an increased risk for hepatitis B should be screened for this virus. You are considered at high risk for hepatitis B if:  You were born in a country where hepatitis B occurs often. Talk with your health care provider about which countries are considered high risk.  Your parents were born in a high-risk country and you have not received a shot to protect against hepatitis B (hepatitis B vaccine).  You have HIV or AIDS.  You use needles to inject street drugs.  You live with, or have sex with, someone who has hepatitis B.  You are a man who has sex with other men (MSM).  You get hemodialysis treatment.  You take certain medicines for conditions such as cancer, organ transplantation, and autoimmune conditions.  Hepatitis C blood testing is recommended for all people born from [birth year] through 1965 and any individual with  known risks for hepatitis C.  Practice safe sex. Use condoms and avoid high-risk sexual practices to reduce the spread of sexually transmitted infections (STIs). STIs include gonorrhea, chlamydia, syphilis, trichomonas, herpes, HPV, and human immunodeficiency virus (HIV). Herpes, HIV, and HPV are viral illnesses that have no cure. They can result in disability, cancer, and death.  If you are a man who has sex with other men, you should be screened at least once per year for:  HIV.  Urethral, rectal, and pharyngeal infection of gonorrhea, chlamydia, or both.  If you are at risk of being infected with HIV, it is recommended that you take a prescription medicine daily to prevent HIV infection. This is called preexposure prophylaxis (PrEP). You are considered at risk if:  You are a man who has sex with other men (MSM) and have other risk factors.  You are a heterosexual man, are sexually active, and are at increased risk for HIV infection.  You take drugs by injection.  You are sexually active with a partner who has  HIV.  Talk with your health care provider about whether you are at high risk of being infected with HIV. If you choose to begin PrEP, you should first be tested for HIV. You should then be tested every 3 months for as long as you are taking PrEP.  A one-time screening for abdominal aortic aneurysm (AAA) and surgical repair of large AAAs by ultrasound are recommended for men ages 65 to 78 years who are current or former smokers.  Healthy men should no longer receive prostate-specific antigen (PSA) blood tests as part of routine cancer screening. Talk with your health care provider about prostate cancer screening.  Testicular cancer screening is not recommended for adult males who have no symptoms. Screening includes self-exam, a health care provider exam, and other screening tests. Consult with your health care provider about any symptoms you have or any concerns you have about testicular cancer.  Use sunscreen. Apply sunscreen liberally and repeatedly throughout the day. You should seek shade when your shadow is shorter than you. Protect yourself by wearing long sleeves, pants, a wide-brimmed hat, and sunglasses year round, whenever you are outdoors.  Once a month, do a whole-body skin exam, using a mirror to look at the skin on your back. Tell your health care provider about new moles, moles that have irregular borders, moles that are larger than a pencil eraser, or moles that have changed in shape or color.  Stay current with required vaccines (immunizations).  Influenza vaccine. All adults should be immunized every year.  Tetanus, diphtheria, and acellular pertussis (Td, Tdap) vaccine. An adult who has not previously received Tdap or who does not know his vaccine status should receive 1 dose of Tdap. This initial dose should be followed by tetanus and diphtheria toxoids (Td) booster doses every 10 years. Adults with an unknown or incomplete history of completing a 3-dose immunization series with  Td-containing vaccines should begin or complete a primary immunization series including a Tdap dose. Adults should receive a Td booster every 10 years.  Varicella vaccine. An adult without evidence of immunity to varicella should receive 2 doses or a second dose if he has previously received 1 dose.  Human papillomavirus (HPV) vaccine. Males aged 11-21 years who have not received the vaccine previously should receive the 3-dose series. Males aged 22-26 years may be immunized. Immunization is recommended through the age of 33 years for any male who has sex with males and did  not get any or all doses earlier. Immunization is recommended for any person with an immunocompromised condition through the age of 26 years if he did not get any or all doses earlier. During the 3-dose series, the second dose should be obtained 4-8 weeks after the first dose. The third dose should be obtained 24 weeks after the first dose and 16 weeks after the second dose.  Zoster vaccine. One dose is recommended for adults aged 13 years or older unless certain conditions are present.  Measles, mumps, and rubella (MMR) vaccine. Adults born before [birth year] generally are considered immune to measles and mumps. Adults born in [birth year] or later should have 1 or more doses of MMR vaccine unless there is a contraindication to the vaccine or there is laboratory evidence of immunity to each of the three diseases. A routine second dose of MMR vaccine should be obtained at least 28 days after the first dose for students attending postsecondary schools, health care workers, or international travelers. People who received inactivated measles vaccine or an unknown type of measles vaccine during 1963-1967 should receive 2 doses of MMR vaccine. People who received inactivated mumps vaccine or an unknown type of mumps vaccine before 1979 and are at high risk for mumps infection should consider immunization with 2 doses of MMR vaccine. Unvaccinated health care  workers born before [birth year] who lack laboratory evidence of measles, mumps, or rubella immunity or laboratory confirmation of disease should consider measles and mumps immunization with 2 doses of MMR vaccine or rubella immunization with 1 dose of MMR vaccine.  Pneumococcal 13-valent conjugate (PCV13) vaccine. When indicated, a person who is uncertain of his immunization history and has no record of immunization should receive the PCV13 vaccine. All adults 77 years of age and older should receive this vaccine. An adult aged 66 years or older who has certain medical conditions and has not been previously immunized should receive 1 dose of PCV13 vaccine. This PCV13 should be followed with a dose of pneumococcal polysaccharide (PPSV23) vaccine. Adults who are at high risk for pneumococcal disease should obtain the PPSV23 vaccine at least 8 weeks after the dose of PCV13 vaccine. Adults older than 42 years of age who have normal immune system function should obtain the PPSV23 vaccine dose at least 1 year after the dose of PCV13 vaccine.  Pneumococcal polysaccharide (PPSV23) vaccine. When PCV13 is also indicated, PCV13 should be obtained first. All adults aged 58 years and older should be immunized. An adult younger than age 10 years who has certain medical conditions should be immunized. Any person who resides in a nursing home or long-term care facility should be immunized. An adult smoker should be immunized. People with an immunocompromised condition and certain other conditions should receive both PCV13 and PPSV23 vaccines. People with human immunodeficiency virus (HIV) infection should be immunized as soon as possible after diagnosis. Immunization during chemotherapy or radiation therapy should be avoided. Routine use of PPSV23 vaccine is not recommended for American Indians, [address], or people younger than 65 years unless there are medical conditions that require PPSV23 vaccine. When indicated, people who  have unknown immunization and have no record of immunization should receive PPSV23 vaccine. One-time revaccination 5 years after the first dose of PPSV23 is recommended for people aged 19-64 years who have chronic kidney failure, nephrotic syndrome, asplenia, or immunocompromised conditions. People who received 1-2 doses of PPSV23 before age 58 years should receive another dose of PPSV23 vaccine at age 20 years or later  if at least 5 years have passed since the previous dose. Doses of PPSV23 are not needed for people immunized with PPSV23 at or after age 77 years.  Meningococcal vaccine. Adults with asplenia or persistent complement component deficiencies should receive 2 doses of quadrivalent meningococcal conjugate (MenACWY-D) vaccine. The doses should be obtained at least 2 months apart. Microbiologists working with certain meningococcal bacteria, [employer] recruits, people at risk during an outbreak, and people who travel to or live in countries with a high rate of meningitis should be immunized. A first-year college student up through age 41 years who is living in a residence hall should receive a dose if he did not receive a dose on or after his 16th birthday. Adults who have certain high-risk conditions should receive one or more doses of vaccine.  Hepatitis A vaccine. Adults who wish to be protected from this disease, have chronic liver disease, work with hepatitis A-infected animals, work in hepatitis A research labs, or travel to or work in countries with a high rate of hepatitis A should be immunized. Adults who were previously unvaccinated and who anticipate close contact with an international adoptee during the first 60 days after arrival in the [place] from a country with a high rate of hepatitis A should be immunized.  Hepatitis B vaccine. Adults should be immunized if they wish to be protected from this disease, are under age 64 years and have diabetes, have chronic liver disease, have had  more than one sex partner in the past 6 months, may be exposed to blood or other infectious body fluids, are household contacts or sex partners of hepatitis B positive people, are clients or workers in certain care facilities, or travel to or work in countries with a high rate of hepatitis B.  Haemophilus influenzae type b (Hib) vaccine. A previously unvaccinated person with asplenia or sickle cell disease or having a scheduled splenectomy should receive 1 dose of Hib vaccine. Regardless of previous immunization, a recipient of a hematopoietic stem cell transplant should receive a 3-dose series 6-12 months after his successful transplant. Hib vaccine is not recommended for adults with HIV infection. Preventive Service / Frequency Ages 34 to 1  Blood pressure check.** / Every 3-5 years.  Lipid and cholesterol check.** / Every 5 years beginning at age 77.  Hepatitis C blood test.** / For any individual with known risks for hepatitis C.  Skin self-exam. / Monthly.  Influenza vaccine. / Every year.  Tetanus, diphtheria, and acellular pertussis (Tdap, Td) vaccine.** / Consult your health care provider. 1 dose of Td every 10 years.  Varicella vaccine.** / Consult your health care provider.  HPV vaccine. / 3 doses over 6 months, if 66 or younger.  Measles, mumps, rubella (MMR) vaccine.** / You need at least 1 dose of MMR if you were born in 1957 or later. You may also need a second dose.  Pneumococcal 13-valent conjugate (PCV13) vaccine.** / Consult your health care provider.  Pneumococcal polysaccharide (PPSV23) vaccine.** / 1 to 2 doses if you smoke cigarettes or if you have certain conditions.  Meningococcal vaccine.** / 1 dose if you are age 56 to 64 years and a [employer] researcher living in a residence hall, or have one of several medical conditions. You may also need additional booster doses.  Hepatitis A vaccine.** / Consult your health care provider.  Hepatitis B vaccine.** /  Consult your health care provider.  Haemophilus influenzae type b (Hib) vaccine.** / Consult your health care  provider. Ages 7 to 14  Blood pressure check.** / Every year.  Lipid and cholesterol check.** / Every 5 years beginning at age 75.  Lung cancer screening. / Every year if you are aged 64-80 years and have a 30-pack-year history of smoking and currently smoke or have quit within the past 15 years. Yearly screening is stopped once you have quit smoking for at least 15 years or develop a health problem that would prevent you from having lung cancer treatment.  Fecal occult blood test (FOBT) of stool. / Every year beginning at age 10 and continuing until age 50. You may not have to do this test if you get a colonoscopy every 10 years.  Flexible sigmoidoscopy** or colonoscopy.** / Every 5 years for a flexible sigmoidoscopy or every 10 years for a colonoscopy beginning at age 74 and continuing until age 9.  Hepatitis C blood test.** / For all people born from [birth year] through 1965 and any individual with known risks for hepatitis C.  Skin self-exam. / Monthly.  Influenza vaccine. / Every year.  Tetanus, diphtheria, and acellular pertussis (Tdap/Td) vaccine.** / Consult your health care provider. 1 dose of Td every 10 years.  Varicella vaccine.** / Consult your health care provider.  Zoster vaccine.** / 1 dose for adults aged 66 years or older.  Measles, mumps, rubella (MMR) vaccine.** / You need at least 1 dose of MMR if you were born in 1959 or later. You may also need a second dose.  Pneumococcal 13-valent conjugate (PCV13) vaccine.** / Consult your health care provider.  Pneumococcal polysaccharide (PPSV23) vaccine.** / 1 to 2 doses if you smoke cigarettes or if you have certain conditions.  Meningococcal vaccine.** / Consult your health care provider.  Hepatitis A vaccine.** / Consult your health care provider.  Hepatitis B vaccine.** / Consult your health care  provider.  Haemophilus influenzae type b (Hib) vaccine.** / Consult your health care provider. Ages 5 and over  Blood pressure check.** / Every year.  Lipid and cholesterol check.**/ Every 5 years beginning at age 61.  Lung cancer screening. / Every year if you are aged 35-80 years and have a 30-pack-year history of smoking and currently smoke or have quit within the past 15 years. Yearly screening is stopped once you have quit smoking for at least 15 years or develop a health problem that would prevent you from having lung cancer treatment.  Fecal occult blood test (FOBT) of stool. / Every year beginning at age 71 and continuing until age 31. You may not have to do this test if you get a colonoscopy every 10 years.  Flexible sigmoidoscopy** or colonoscopy.** / Every 5 years for a flexible sigmoidoscopy or every 10 years for a colonoscopy beginning at age 34 and continuing until age 46.  Hepatitis C blood test.** / For all people born from [birth year] through 1967 and any individual with known risks for hepatitis C.  Abdominal aortic aneurysm (AAA) screening.** / A one-time screening for ages 103 to 10 years who are current or former smokers.  Skin self-exam. / Monthly.  Influenza vaccine. / Every year.  Tetanus, diphtheria, and acellular pertussis (Tdap/Td) vaccine.** / 1 dose of Td every 10 years.  Varicella vaccine.** / Consult your health care provider.  Zoster vaccine.** / 1 dose for adults aged 42 years or older.  Pneumococcal 13-valent conjugate (PCV13) vaccine.** / 1 dose for all adults aged 2 years and older.  Pneumococcal polysaccharide (PPSV23) vaccine.** / 1 dose for all adults  aged 79 years and older.  Meningococcal vaccine.** / Consult your health care provider.  Hepatitis A vaccine.** / Consult your health care provider.  Hepatitis B vaccine.** / Consult your health care provider.  Haemophilus influenzae type b (Hib) vaccine.** / Consult your health care  provider. **Family history and personal history of risk and conditions may change your health care provider's recommendations.   This information is not intended to replace advice given to you by your health care provider. Make sure you discuss any questions you have with your health care provider.   Document Released: 06/03/2001 Document Revised: 04/28/2014 Document Reviewed: 09/02/2010 Elsevier Interactive Patient Education [employer].

## 2015-12-21 NOTE — Progress Notes (Signed)
Pre visit review using our clinic review tool, if applicable. No additional management support is needed unless otherwise documented below in the visit note. 

## 2015-12-21 NOTE — Progress Notes (Signed)
Patient presents to clinic today for annual exam.  Patient is fasting for labs.  Acute Concerns: Bilateral Elbow and forearm pain x 1 month. No trauma or injury. Denies swelling or bruising. Has been avoiding heavy lifting due to pain. Does do repetitive motions with arms at work. Has not taken anything for symptoms.   Patient requesting 20 mg Cialis refill for ED.   Chronic Issues: Hypertension -- Is currently on regimen of lisinopril-HCTZ. Is taking as directed. Patient denies chest pain, palpitations, lightheadedness, dizziness, vision changes or frequent headaches.  BP Readings from Last 3 Encounters:  12/21/15 106/80  12/03/15 122/85  12/03/15 128/98   GERD -- Is currently on Nexium daily with good relief of symptoms. Is avoiding trigger foods. Denies breakthrough symptoms on current regimen.   Health Maintenance: Immunizations -- Endorses tetanus within past 10 years but unsure of date. Declines immunization today. Declines flu shot.   HIV Screening -- Endorses negative screen 4 years ago. Denies concerns warranting recheck. Has been married for 14 years.    Past Medical History:  Diagnosis Date  . Chronic neck pain   . GERD (gastroesophageal reflux disease)   . Gout   . Hypertension   . Hypertension     Past Surgical History:  Procedure Laterality Date  . HEMORRHOID BANDING  2016  . negative      Current Outpatient Prescriptions on File Prior to Visit  Medication Sig Dispense Refill  . esomeprazole (NEXIUM) 20 MG capsule Take 2 capsules (40 mg total) by mouth every morning. 60 capsule 0  . lisinopril-hydrochlorothiazide (PRINZIDE,ZESTORETIC) 20-12.5 MG tablet Take 1 tablet by mouth daily. 30 tablet 0   No current facility-administered medications on file prior to visit.     No Known Allergies  Family History  Problem Relation Age of Onset  . Hypertension Mother   . Diabetes Mother   . Hypertension Father     Social History   Social History  .  Marital status: Married    Spouse name: N/A  . Number of children: N/A  . Years of education: N/A   Occupational History  . Not on file.   Social History Main Topics  . Smoking status: Never Smoker  . Smokeless tobacco: Never Used  . Alcohol use Yes     Comment: socially  . Drug use: No  . Sexual activity: No   Other Topics Concern  . Not on file   Social History Narrative  . No narrative on file    Review of Systems  Constitutional: Negative for fever and weight loss.  HENT: Negative for ear discharge, ear pain, hearing loss and tinnitus.   Eyes: Negative for blurred vision, double vision, photophobia and pain.  Respiratory: Negative for cough and shortness of breath.   Cardiovascular: Negative for chest pain and palpitations.  Gastrointestinal: Positive for heartburn. Negative for abdominal pain, blood in stool, constipation, diarrhea, melena, nausea and vomiting.  Genitourinary: Negative for dysuria, flank pain, frequency, hematuria and urgency.  Musculoskeletal: Positive for joint pain. Negative for falls.  Neurological: Negative for dizziness, loss of consciousness and headaches.  Endo/Heme/Allergies: Negative for environmental allergies.  Psychiatric/Behavioral: Negative for depression, hallucinations, substance abuse and suicidal ideas. The patient is not nervous/anxious and does not have insomnia.    BP 106/80 (BP Location: Left Arm, Patient Position: Sitting, Cuff Size: Large)   Pulse 82   Temp 97.8 F (36.6 C) (Oral)   Resp 16   Ht 5' 8.75" (1.746 m)  Wt 220 lb 3.2 oz (99.9 kg)   SpO2 98%   BMI 32.75 kg/m   Physical Exam  Constitutional: He is oriented to person, place, and time and well-developed, well-nourished, and in no distress.  HENT:  Head: Normocephalic and atraumatic.  Right Ear: External ear normal.  Left Ear: External ear normal.  Nose: Nose normal.  Mouth/Throat: Oropharynx is clear and moist. No oropharyngeal exudate.  Eyes: Conjunctivae  and EOM are normal. Pupils are equal, round, and reactive to light.  Neck: Neck supple. No thyromegaly present.  Cardiovascular: Normal rate, regular rhythm, normal heart sounds and intact distal pulses.   Pulmonary/Chest: Effort normal and breath sounds normal. No respiratory distress. He has no wheezes. He has no rales. He exhibits no tenderness.  Abdominal: Soft. Bowel sounds are normal. He exhibits no distension and no mass. There is no tenderness. There is no rebound and no guarding.  Genitourinary: Testes/scrotum normal and penis normal. No discharge found.  Musculoskeletal:       Right elbow: Tenderness found. Lateral epicondyle tenderness noted.       Left elbow: Tenderness found. Lateral epicondyle tenderness noted.  Lymphadenopathy:    He has no cervical adenopathy.  Neurological: He is alert and oriented to person, place, and time.  Skin: Skin is warm and dry. No rash noted.  Psychiatric: Affect normal.  Vitals reviewed.  Assessment/Plan: Visit for preventive health examination Depression screen negative. Health Maintenance reviewed -- Declines tetanus and flu shots. Preventive schedule discussed and handout given in AVS. Will obtain fasting labs today.   Prostate cancer screening The natural history of prostate cancer and ongoing controversy regarding screening and potential treatment outcomes of prostate cancer has been discussed with the patient. The meaning of a false positive PSA and a false negative PSA has been discussed. He indicates understanding of the limitations of this screening test and wishes to proceed with screening PSA testing. Declines DRE. Asymptomatic. Average risk.   Lateral epicondylitis Discussed bilateral bracing. [facility] Rx mobic. Supportive measures reviewed. Recommend Sports Medicine assessment if symptoms are not resolving over the next couple of weeks.  Essential hypertension, benign Stable. Continue  current regimen. Will check CMP today.    Piedad ClimesMartin, Pleas Carneal Cody, PA-C

## 2015-12-21 NOTE — Assessment & Plan Note (Signed)
Stable. Continue current regimen. Will check CMP today.

## 2015-12-25 ENCOUNTER — Telehealth: Payer: Self-pay

## 2015-12-25 ENCOUNTER — Other Ambulatory Visit: Payer: Self-pay | Admitting: Physician Assistant

## 2015-12-25 NOTE — Telephone Encounter (Signed)
Pt called in to check the confirm form receipt from pharmacy. Pt would like a call back for confirmation.

## 2015-12-25 NOTE — Telephone Encounter (Signed)
Last Rx written in ED on 12/03/15 for #30x0; Too Soon for refill; will Hold Until Rx is due, provider aware/SLS 09/05

## 2015-12-25 NOTE — Telephone Encounter (Signed)
Patient called stating [facility] sent over a form to be completed last week for his Cialis patient would like to know have we received it. They sent again today.

## 2015-12-25 NOTE — Telephone Encounter (Signed)
Form received. PA in process. Called patient to make him aware. Unable to reach patient.  Phone recording states that patient is unable to receive calls at this time. Will try again later.

## 2015-12-26 ENCOUNTER — Other Ambulatory Visit: Payer: Self-pay | Admitting: Physician Assistant

## 2015-12-26 MED ORDER — ATORVASTATIN CALCIUM 10 MG PO TABS: 10.0000 mg | ORAL_TABLET | Freq: Every day | ORAL | 2 refills | Status: DC

## 2015-12-26 NOTE — Telephone Encounter (Signed)
Caller name: Marcial Pacasimothy Relationship to patient: self Can be reached: [phone] cell # [facility] [address] - [place], [place] - [id number]  Reason for call:  Pt states that he is out of lisinopril. He did have meds filled after ER visit but states he dropped the bottle and several were on the floor that he discarded. Pt requesting RX be sent in for him.

## 2015-12-26 NOTE — Telephone Encounter (Signed)
Rx request to pharmacy; pt informed via message on VM/SLS

## 2015-12-27 ENCOUNTER — Telehealth: Payer: Self-pay | Admitting: Physician Assistant

## 2015-12-27 NOTE — Telephone Encounter (Signed)
Pt called in because he says that he was prescribed cialis by provider. Pt says that his insurance suggested Viagra instead. Pt says that insurance will cover Cialis IF Viagra (suggested) doesn't work.    Pt would like to have Viagra sent to pharmacy.

## 2015-12-27 NOTE — Telephone Encounter (Addendum)
Received denial letter for the Cialis.  Pl[facility] for [facility] and make recommendation to change to alternative medication or do an appeal.  [facility] and [facility] @ 11:48am @ [phone]) informing the pt that we received the denial letter and we are waiting on Cody to review it.//AB/CMA

## 2015-12-28 MED ORDER — SILDENAFIL CITRATE 50 MG PO TABS: 25.0000 mg | ORAL_TABLET | Freq: Every day | ORAL | 0 refills | Status: DC | PRN

## 2015-12-28 NOTE — Telephone Encounter (Signed)
[facility] with Den[facility] for Cialis 20mg ; the only alternative medication that must be tried is Viagra. Patient is aware and requested Viagra to be sent to pharmacy; per provider VO Viagra 50 mg, take [0.5 tablet] 25 mg daily as needed for ED [with note that is not cost wise for pt, then he can use [facility]]/SLS 09/08

## 2016-01-03 ENCOUNTER — Emergency Department (HOSPITAL_BASED_OUTPATIENT_CLINIC_OR_DEPARTMENT_OTHER)
Admission: EM | Admit: 2016-01-03 | Discharge: 2016-01-03 | Disposition: A | Discharge status: Home / Self Care | Payer: BLUE CROSS/BLUE SHIELD | Attending: Emergency Medicine | Admitting: Emergency Medicine

## 2016-01-03 ENCOUNTER — Encounter (HOSPITAL_BASED_OUTPATIENT_CLINIC_OR_DEPARTMENT_OTHER): Payer: Self-pay | Admitting: *Deleted

## 2016-01-03 DIAGNOSIS — Z79899 Other long term (current) drug therapy: Secondary | ICD-10-CM | POA: Insufficient documentation

## 2016-01-03 DIAGNOSIS — J309 Allergic rhinitis, unspecified: Secondary | ICD-10-CM

## 2016-01-03 DIAGNOSIS — R0981 Nasal congestion: Secondary | ICD-10-CM | POA: Diagnosis present

## 2016-01-03 DIAGNOSIS — I10 Essential (primary) hypertension: Secondary | ICD-10-CM | POA: Insufficient documentation

## 2016-01-03 MED ORDER — DEXAMETHASONE SODIUM PHOSPHATE 10 MG/ML IJ SOLN
10.0000 mg | Freq: Once | INTRAMUSCULAR | Status: AC
  Administered 2016-01-03: 10 mg via INTRAMUSCULAR
  Filled 2016-01-03: qty 1

## 2016-01-03 NOTE — ED Notes (Signed)
PA at bedside.

## 2016-01-03 NOTE — ED Triage Notes (Signed)
Pt c/o URi symptoms x 1 week  

## 2016-01-03 NOTE — Discharge Instructions (Signed)
Please follow with your primary care doctor in the next 2 days for a check-up. They must obtain records for further management.  ° °Do not hesitate to return to the Emergency Department for any new, worsening or concerning symptoms.  ° °

## 2016-01-03 NOTE — ED Provider Notes (Signed)
[facility]   CSN: [id number] Arrival date & time: 01/03/16  1339     History   Chief Complaint Chief Complaint  Patient presents with  . URI    HPI  Blood pressure 111/71, pulse 95, temperature 98.1 F (36.7 C), temperature source Oral, resp. rate 20, height 5\' 9"  (1.753 m), weight 97.5 kg, SpO2 99 %.  John Simmons is a 40 y.o. male complaining of nasal congestion, sinus pressure and dizziness worsening over the course of last several days. He states this is typical for his allergy exacerbations. States it happens whenever the weather changes. He's had good relief with Claritin in the past but he had a over-the-counter [employer] medication without relief yesterday. States that he cannot afford Claritin until he gets pain tomorrow. He denies fevers, chills, change in vision, cough, shortness of breath, chest pain, change in bowel or bladder habits.  HPI  Past Medical History:  Diagnosis Date  . Chronic neck pain   . GERD (gastroesophageal reflux disease)   . Gout   . Hypertension   . Hypertension     Patient Active Problem List   Diagnosis Date Noted  . Prostate cancer screening 12/21/2015  . Visit for preventive health examination 12/21/2015  . Lateral epicondylitis 12/21/2015  . Anxiety and depression 02/27/2015  . Essential hypertension, benign 03/08/2014  . GERD (gastroesophageal reflux disease) 03/08/2014    Past Surgical History:  Procedure Laterality Date  . HEMORRHOID BANDING  2016  . negative         Home Medications    Prior to Admission medications   Medication Sig Start Date End Date Taking? Authorizing Provider  atorvastatin (LIPITOR) 10 MG tablet Take 1 tablet (10 mg total) by mouth daily. 12/26/15   Waldon Merl, PA-C  esomeprazole (NEXIUM) 20 MG capsule Take 2 capsules (40 mg total) by mouth every morning. 11/02/13   Benjiman Core, MD  lisinopril-hydrochlorothiazide (PRINZIDE,ZESTORETIC) 20-12.5 MG tablet  Take 1 tablet by mouth daily. 12/26/15   Waldon Merl, PA-C  meloxicam (MOBIC) 15 MG tablet Take 1 tablet (15 mg total) by mouth daily. 12/21/15   Waldon Merl, PA-C  sildenafil (VIAGRA) 50 MG tablet Take 0.5 tablets (25 mg total) by mouth daily as needed for erectile dysfunction. 12/28/15   Waldon Merl, PA-C  tadalafil (CIALIS) 20 MG tablet Take 1 tablet (20 mg total) by mouth daily as needed for erectile dysfunction. 12/21/15   Waldon Merl, PA-C    Family History Family History  Problem Relation Age of Onset  . Hypertension Mother   . Diabetes Mother   . Hypertension Father     Social History Social History  Substance Use Topics  . Smoking status: Never Smoker  . Smokeless tobacco: Never Used  . Alcohol use Yes     Comment: socially     Allergies   Review of patient's allergies indicates no known allergies.   Review of Systems Review of Systems   Physical Exam Updated Vital Signs BP 111/71 (BP Location: Left Arm)   Pulse 95   Temp 98.1 F (36.7 C) (Oral)   Resp 20   Ht 5\' 9"  (1.753 m)   Wt 97.5 kg   SpO2 99%   BMI 31.75 kg/m   Physical Exam  Constitutional: He appears well-developed and well-nourished.  HENT:  Head: Normocephalic.  Right Ear: External ear normal.  Left Ear: External ear normal.  Mouth/Throat: Oropharynx is clear and moist. No oropharyngeal  exudate.  No drooling or stridor. Posterior pharynx mildly erythematous no significant tonsillar hypertrophy. No exudate. Soft palate rises symmetrically. No TTP or induration under tongue.   No tenderness to palpation of frontal or bilateral maxillary sinuses.  Mild mucosal edema in the nares with scant rhinorrhea.  Bilateral tympanic membranes with normal architecture and good light reflex.    Eyes: Conjunctivae and EOM are normal. Pupils are equal, round, and reactive to light.  Neck: Normal range of motion. Neck supple.  Cardiovascular: Normal rate and regular rhythm.     Pulmonary/Chest: Effort normal and breath sounds normal. No stridor. No respiratory distress. He has no wheezes. He has no rales. He exhibits no tenderness.  Abdominal: Soft. There is no tenderness. There is no rebound and no guarding.  Nursing note and vitals reviewed.    ED Treatments / Results  Labs (all labs ordered are listed, but only abnormal results are displayed) Labs Reviewed - No data to display  EKG  EKG Interpretation None       Radiology No results found.  Procedures Procedures (including critical care time)  Medications Ordered in ED Medications  dexamethasone (DECADRON) injection 10 mg (10 mg Intramuscular Given 01/03/16 1520)     Initial Impression / Assessment and Plan / ED Course  I have reviewed the triage vital signs and the nursing notes.  Pertinent labs & imaging results that were available during my care of the patient were reviewed by me and considered in my medical decision making (see chart for details).  Clinical Course    Vitals:   01/03/16 1350  BP: 111/71  Pulse: 95  Resp: 20  Temp: 98.1 F (36.7 C)  TempSrc: Oral  SpO2: 99%  Weight: 97.5 kg  Height: 5\' 9"  (1.753 m)    Medications  dexamethasone (DECADRON) injection 10 mg (10 mg Intramuscular Given 01/03/16 1520)    John Simmons is 40 y.o. male presenting with Upper respiratory symptoms consistent with allergic rhinitis. Patient cannot afford allergy medication, will be given Decadron IM in the ED.  Evaluation does not show pathology that would require ongoing emergent intervention or inpatient treatment. Pt is hemodynamically stable and mentating appropriately. Discussed findings and plan with patient/guardian, who agrees with care plan. All questions answered. Return precautions discussed and outpatient follow up given.      Final Clinical Impressions(s) / ED Diagnoses   Final diagnoses:  Allergic rhinitis, unspecified allergic rhinitis type    New  Prescriptions New Prescriptions   No medications on file     Wynetta Emeryicole Rayana Geurin, PA-C 01/03/16 1520    Nelva Nayobert Beaton, MD 01/05/16 0900

## 2016-01-03 NOTE — ED Notes (Signed)
Pt c/o dizziness x 3 days and states he knows its allergies. Denies sore throat, reports intermittent headache. Also reports clear nasal drainage.

## 2016-03-05 ENCOUNTER — Other Ambulatory Visit: Payer: Self-pay | Admitting: Physician Assistant

## 2016-03-06 NOTE — Telephone Encounter (Signed)
Left message on machine about Meloxicam refill. Per Selena Battenody patient is not to be on medication for long period of time. Wanted to know if patient is still having pain of elbows and forearms and needing the Meloxicam.

## 2016-03-11 ENCOUNTER — Other Ambulatory Visit: Payer: Self-pay | Admitting: Physician Assistant

## 2016-03-14 ENCOUNTER — Encounter (HOSPITAL_BASED_OUTPATIENT_CLINIC_OR_DEPARTMENT_OTHER): Payer: Self-pay

## 2016-03-14 ENCOUNTER — Emergency Department (HOSPITAL_BASED_OUTPATIENT_CLINIC_OR_DEPARTMENT_OTHER)
Admission: EM | Admit: 2016-03-14 | Discharge: 2016-03-14 | Disposition: A | Discharge status: Home / Self Care | Payer: BLUE CROSS/BLUE SHIELD | Attending: Dermatology | Admitting: Dermatology

## 2016-03-14 DIAGNOSIS — I10 Essential (primary) hypertension: Secondary | ICD-10-CM | POA: Diagnosis not present

## 2016-03-14 DIAGNOSIS — Z79899 Other long term (current) drug therapy: Secondary | ICD-10-CM | POA: Insufficient documentation

## 2016-03-14 DIAGNOSIS — M10072 Idiopathic gout, left ankle and foot: Secondary | ICD-10-CM | POA: Diagnosis present

## 2016-03-14 DIAGNOSIS — Z5321 Procedure and treatment not carried out due to patient leaving prior to being seen by health care provider: Secondary | ICD-10-CM | POA: Diagnosis not present

## 2016-03-14 NOTE — ED Triage Notes (Signed)
C/o gout to left great toe x 2 days

## 2016-03-15 ENCOUNTER — Emergency Department (HOSPITAL_BASED_OUTPATIENT_CLINIC_OR_DEPARTMENT_OTHER)
Admission: EM | Admit: 2016-03-15 | Discharge: 2016-03-15 | Disposition: A | Discharge status: Home / Self Care | Payer: BLUE CROSS/BLUE SHIELD | Attending: Emergency Medicine | Admitting: Emergency Medicine

## 2016-03-15 ENCOUNTER — Encounter (HOSPITAL_BASED_OUTPATIENT_CLINIC_OR_DEPARTMENT_OTHER): Payer: Self-pay | Admitting: *Deleted

## 2016-03-15 DIAGNOSIS — M1000 Idiopathic gout, unspecified site: Secondary | ICD-10-CM | POA: Insufficient documentation

## 2016-03-15 DIAGNOSIS — Z79899 Other long term (current) drug therapy: Secondary | ICD-10-CM | POA: Diagnosis not present

## 2016-03-15 DIAGNOSIS — I10 Essential (primary) hypertension: Secondary | ICD-10-CM | POA: Diagnosis not present

## 2016-03-15 DIAGNOSIS — M79672 Pain in left foot: Secondary | ICD-10-CM | POA: Diagnosis present

## 2016-03-15 MED ORDER — INDOMETHACIN 50 MG PO CAPS: 50.0000 mg | ORAL_CAPSULE | Freq: Two times a day (BID) | ORAL | 0 refills | Status: DC

## 2016-03-15 MED ORDER — KETOROLAC TROMETHAMINE 60 MG/2ML IM SOLN
60.0000 mg | Freq: Once | INTRAMUSCULAR | Status: AC
  Administered 2016-03-15: 60 mg via INTRAMUSCULAR
  Filled 2016-03-15: qty 2

## 2016-03-15 NOTE — ED Triage Notes (Signed)
Patient states he has had a gout flare for the last three days.  Started in left foot and now has in both feet.

## 2016-03-15 NOTE — ED Provider Notes (Signed)
[facility]   CSN: [id number] Arrival date & time: 03/15/16  1210     History   Chief Complaint Chief Complaint  Patient presents with  . Gout    HPI John Simmons is a 40 y.o. male.  HPI Patient has history of gout. States he developed left foot pain starting 3 days ago. Pain started at the base of his great toe is now spread to the entire foot. He also is developing mild right foot pain. Denies any fever or chills. States this pain is similar to his pain associated with gout. He is taking no medications at home for the pain. No known trauma. Past Medical History:  Diagnosis Date  . Chronic neck pain   . GERD (gastroesophageal reflux disease)   . Gout   . Hypertension   . Hypertension     Patient Active Problem List   Diagnosis Date Noted  . Prostate cancer screening 12/21/2015  . Visit for preventive health examination 12/21/2015  . Lateral epicondylitis 12/21/2015  . Anxiety and depression 02/27/2015  . Essential hypertension, benign 03/08/2014  . GERD (gastroesophageal reflux disease) 03/08/2014    Past Surgical History:  Procedure Laterality Date  . HEMORRHOID BANDING  2016  . negative         Home Medications    Prior to Admission medications   Medication Sig Start Date End Date Taking? Authorizing Provider  atorvastatin (LIPITOR) 10 MG tablet Take 1 tablet (10 mg total) by mouth daily. 12/26/15  Yes Waldon MerlWilliam C Martin, PA-C  esomeprazole (NEXIUM) 20 MG capsule Take 2 capsules (40 mg total) by mouth every morning. 11/02/13  Yes Benjiman CoreNathan Pickering, MD  lisinopril-hydrochlorothiazide (PRINZIDE,ZESTORETIC) 20-12.5 MG tablet TAKE ONE TABLET BY MOUTH ONCE DAILY 03/11/16  Yes Waldon MerlWilliam C Martin, PA-C  sildenafil (VIAGRA) 50 MG tablet Take 0.5 tablets (25 mg total) by mouth daily as needed for erectile dysfunction. 12/28/15  Yes Waldon MerlWilliam C Martin, PA-C  tadalafil (CIALIS) 20 MG tablet Take 1 tablet (20 mg total) by mouth daily as needed for  erectile dysfunction. 12/21/15  Yes Waldon MerlWilliam C Martin, PA-C  indomethacin (INDOCIN) 50 MG capsule Take 1 capsule (50 mg total) by mouth 2 (two) times daily with a meal. 03/15/16   Loren Raceravid Calisha Tindel, MD    Family History Family History  Problem Relation Age of Onset  . Hypertension Mother   . Diabetes Mother   . Hypertension Father     Social History Social History  Substance Use Topics  . Smoking status: Never Smoker  . Smokeless tobacco: Never Used  . Alcohol use No     Allergies   Patient has no known allergies.   Review of Systems Review of Systems  Constitutional: Negative for chills and fever.  Gastrointestinal: Negative for nausea and vomiting.  Musculoskeletal: Positive for arthralgias. Negative for joint swelling and myalgias.  Skin: Negative for rash and wound.  Neurological: Negative for weakness and numbness.  All other systems reviewed and are negative.    Physical Exam Updated Vital Signs BP 112/83 (BP Location: Right Arm)   Pulse 73   Temp 98.7 F (37.1 C) (Oral)   Resp 20   Ht 5\' 8"  (1.727 m)   Wt 215 lb (97.5 kg)   SpO2 98%   BMI 32.69 kg/m   Physical Exam  Constitutional: He is oriented to person, place, and time. He appears well-developed and well-nourished. No distress.  HENT:  Head: Normocephalic and atraumatic.  Eyes: EOM are normal. Pupils  are equal, round, and reactive to light.  Neck: Normal range of motion. Neck supple.  Cardiovascular: Normal rate and regular rhythm.   Pulmonary/Chest: Effort normal and breath sounds normal.  Abdominal: Soft. Bowel sounds are normal.  Musculoskeletal: Normal range of motion. He exhibits tenderness. He exhibits no edema.  Patient with tenderness over the left MTP joint. No obvious erythema, swelling or warmth. He had some mild tenderness over the medial surface of the right foot. No erythema, swelling or warmth noted. Full range of motion of bilateral ankles. No calf tenderness or asymmetry. 2+ dorsalis  pedis and posterior tibial pulses. Good distal cap refill.  Neurological: He is alert and oriented to person, place, and time.  5/5 motor in all extremities. Sensation is fully intact.  Skin: Skin is warm and dry. Capillary refill takes less than 2 seconds. No rash noted. He is not diaphoretic. No erythema.  Psychiatric: He has a normal mood and affect. His behavior is normal.  Nursing note and vitals reviewed.    ED Treatments / Results  Labs (all labs ordered are listed, but only abnormal results are displayed) Labs Reviewed - No data to display  EKG  EKG Interpretation None       Radiology No results found.  Procedures Procedures (including critical care time)  Medications Ordered in ED Medications  ketorolac (TORADOL) injection 60 mg (not administered)     Initial Impression / Assessment and Plan / ED Course  I have reviewed the triage vital signs and the nursing notes.  Pertinent labs & imaging results that were available during my care of the patient were reviewed by me and considered in my medical decision making (see chart for details).  Clinical Course    We'll treat for likely gout flare. Low suspicion for septic joint. Return precautions given.  Final Clinical Impressions(s) / ED Diagnoses   Final diagnoses:  Acute idiopathic gout, unspecified site    New Prescriptions New Prescriptions   INDOMETHACIN (INDOCIN) 50 MG CAPSULE    Take 1 capsule (50 mg total) by mouth 2 (two) times daily with a meal.     Loren Raceravid Tykel Badie, MD 03/15/16 [phone]

## 2016-03-30 DIAGNOSIS — M1000 Idiopathic gout, unspecified site: Secondary | ICD-10-CM | POA: Diagnosis not present

## 2016-04-04 ENCOUNTER — Ambulatory Visit: Payer: BLUE CROSS/BLUE SHIELD | Admitting: Physician Assistant

## 2016-04-10 ENCOUNTER — Encounter (HOSPITAL_BASED_OUTPATIENT_CLINIC_OR_DEPARTMENT_OTHER): Payer: Self-pay

## 2016-04-10 ENCOUNTER — Emergency Department (HOSPITAL_BASED_OUTPATIENT_CLINIC_OR_DEPARTMENT_OTHER)
Admission: EM | Admit: 2016-04-10 | Discharge: 2016-04-10 | Disposition: A | Discharge status: Home / Self Care | Payer: BLUE CROSS/BLUE SHIELD | Attending: Physician Assistant | Admitting: Physician Assistant

## 2016-04-10 DIAGNOSIS — M109 Gout, unspecified: Secondary | ICD-10-CM

## 2016-04-10 DIAGNOSIS — I10 Essential (primary) hypertension: Secondary | ICD-10-CM | POA: Diagnosis not present

## 2016-04-10 DIAGNOSIS — Z79899 Other long term (current) drug therapy: Secondary | ICD-10-CM | POA: Diagnosis not present

## 2016-04-10 DIAGNOSIS — M10072 Idiopathic gout, left ankle and foot: Secondary | ICD-10-CM | POA: Diagnosis not present

## 2016-04-10 DIAGNOSIS — M79672 Pain in left foot: Secondary | ICD-10-CM | POA: Diagnosis present

## 2016-04-10 MED ORDER — KETOROLAC TROMETHAMINE 30 MG/ML IJ SOLN
30.0000 mg | Freq: Once | INTRAMUSCULAR | Status: AC
  Administered 2016-04-10: 30 mg via INTRAMUSCULAR
  Filled 2016-04-10: qty 1

## 2016-04-10 MED ORDER — INDOMETHACIN 25 MG PO CAPS: 25.0000 mg | ORAL_CAPSULE | Freq: Three times a day (TID) | ORAL | 0 refills | Status: DC | PRN

## 2016-04-10 MED ORDER — COLCHICINE 0.6 MG PO TABS: 0.6000 mg | ORAL_TABLET | Freq: Every day | ORAL | 0 refills | Status: DC

## 2016-04-10 MED ORDER — OXYCODONE-ACETAMINOPHEN 5-325 MG PO TABS: 1.0000 | ORAL_TABLET | Freq: Four times a day (QID) | ORAL | 0 refills | Status: DC | PRN

## 2016-04-10 NOTE — ED Provider Notes (Signed)
[facility] Provider Note   CSN: [id number] Arrival date & time: 04/10/16  2101  By signing my name below, I, Valentino SaxonBianca Contreras, attest that this documentation has been prepared under the direction and in the presence of Doralee Kocak Randall AnLyn Urias Sheek, MD. Electronically Signed: Valentino SaxonBianca Contreras, ED Scribe. 04/10/16. 9:33 PM.  History   Chief Complaint Chief Complaint  Patient presents with  . Gout   The history is provided by the patient. No language interpreter was used.   HPI Comments: John Simmons is a 40 y.o. male with PMHx of Gout, who presents to the Emergency Department complaining of gradually worsening, left foot pain onset.  Per pt he notes he has gout in his left large toe. He reports being seen in the ED on 03/15/16 for similar symptoms. He notes the medication given in the ED provided minimal relief, but states pain has returned. Pt is able to ambulate without difficulty. Pt notes pain is worsened when bearing weight on left foot. He denies recent trauma to his left foot. Pt also denies fever and chills. No additional complaints at this time.   Past Medical History:  Diagnosis Date  . Chronic neck pain   . GERD (gastroesophageal reflux disease)   . Gout   . Hypertension   . Hypertension     Patient Active Problem List   Diagnosis Date Noted  . Prostate cancer screening 12/21/2015  . Visit for preventive health examination 12/21/2015  . Lateral epicondylitis 12/21/2015  . Anxiety and depression 02/27/2015  . Essential hypertension, benign 03/08/2014  . GERD (gastroesophageal reflux disease) 03/08/2014    Past Surgical History:  Procedure Laterality Date  . HEMORRHOID BANDING  2016  . negative         Home Medications    Prior to Admission medications   Medication Sig Start Date End Date Taking? Authorizing Provider  atorvastatin (LIPITOR) 10 MG tablet Take 1 tablet (10 mg total) by mouth daily. 12/26/15   Waldon MerlWilliam C Martin, PA-C  esomeprazole  (NEXIUM) 20 MG capsule Take 2 capsules (40 mg total) by mouth every morning. 11/02/13   Benjiman CoreNathan Pickering, MD  lisinopril-hydrochlorothiazide (PRINZIDE,ZESTORETIC) 20-12.5 MG tablet TAKE ONE TABLET BY MOUTH ONCE DAILY 03/11/16   Waldon MerlWilliam C Martin, PA-C  sildenafil (VIAGRA) 50 MG tablet Take 0.5 tablets (25 mg total) by mouth daily as needed for erectile dysfunction. 12/28/15   Waldon MerlWilliam C Martin, PA-C  tadalafil (CIALIS) 20 MG tablet Take 1 tablet (20 mg total) by mouth daily as needed for erectile dysfunction. 12/21/15   Waldon MerlWilliam C Martin, PA-C    Family History Family History  Problem Relation Age of Onset  . Hypertension Mother   . Diabetes Mother   . Hypertension Father     Social History Social History  Substance Use Topics  . Smoking status: Never Smoker  . Smokeless tobacco: Never Used  . Alcohol use No     Allergies   Patient has no known allergies.   Review of Systems Review of Systems  Constitutional: Negative for chills and fever.  Musculoskeletal: Positive for arthralgias.  All other systems reviewed and are negative.    Physical Exam Updated Vital Signs BP 129/93 (BP Location: Right Arm)   Pulse 78   Temp 98 F (36.7 C) (Oral)   Resp 18   Ht 5\' 9"  (1.753 m)   Wt 218 lb 12.8 oz (99.2 kg)   SpO2 100%   BMI 32.31 kg/m   Physical Exam  Constitutional: He appears well-developed  and well-nourished.  HENT:  Head: Normocephalic and atraumatic.  Eyes: Conjunctivae are normal. Right eye exhibits no discharge. Left eye exhibits no discharge.  Pulmonary/Chest: Effort normal. No respiratory distress.  Musculoskeletal: Normal range of motion.  Mild warmth. Mild swelling. No surrounding erythema. No exquisite tenderness on movement of the large toe. Well appearing.   Neurological: He is alert. Coordination normal.  Skin: Skin is warm and dry. No rash noted. He is not diaphoretic. No erythema.  Psychiatric: He has a normal mood and affect.  Nursing note and vitals  reviewed.    ED Treatments / Results   DIAGNOSTIC STUDIES: Oxygen Saturation is 100% on RA, normal by my interpretation.    COORDINATION OF CARE: 9:32 PM Discussed treatment plan with pt at bedside which includes steroid injection and pt agreed to plan.   Labs (all labs ordered are listed, but only abnormal results are displayed) Labs Reviewed - No data to display  EKG  EKG Interpretation None       Radiology No results found.  Procedures Procedures (including critical care time)  Medications Ordered in ED Medications  ketorolac (TORADOL) 30 MG/ML injection 30 mg (not administered)     Initial Impression / Assessment and Plan / ED Course  I have reviewed the triage vital signs and the nursing notes.  Pertinent labs & imaging results that were available during my care of the patient were reviewed by me and considered in my medical decision making (see chart for details).  Clinical Course    Patient is a 40 year old male presenting with gout flare. Patient has had treatment for the sam here one month ago. He reports that the shot that he received there helped him a lot. This was ketorolac. We'll give another shot of ketorolac today. Patient went home with indomethacin he said helped mostly but he still had some breakthrough pain. We'll give him indomethacin again today with some Percocets for breakthrough pain. In addition we will treat with colchicine. We again encourage patient to follow up with PCP. He said he had a family emergency during his last appointmetn so wasn't able to go. He promises that he will go January.  Do not suspect infection or septic joint.    Final Clinical Impressions(s) / ED Diagnoses   Final diagnoses:  None    New Prescriptions New Prescriptions   No medications on file    I personally performed the services described in this documentation, which was scribed in my presence. The recorded information has been reviewed and is accurate.        Motty Borin Randall AnLyn Lavere Shinsky, MD 04/10/16 2142

## 2016-04-10 NOTE — ED Notes (Signed)
Pt verbalizes understanding of d/c instructions and denies any further needs at this time.  Encouraged to schedule appointment with PCP

## 2016-04-10 NOTE — ED Triage Notes (Signed)
C/o "gout" to left foot-NAD-steady gait

## 2016-04-10 NOTE — Discharge Instructions (Signed)
We think that this is likely gout. We are giving you treatment for it here today. However we need you to follow up with your primary care physician for any further treatment.

## 2016-04-23 ENCOUNTER — Other Ambulatory Visit: Payer: Self-pay | Admitting: Physician Assistant

## 2016-04-25 ENCOUNTER — Ambulatory Visit: Payer: BLUE CROSS/BLUE SHIELD | Admitting: Physician Assistant

## 2016-04-28 ENCOUNTER — Other Ambulatory Visit: Payer: Self-pay | Admitting: Physician Assistant

## 2016-04-28 NOTE — Telephone Encounter (Signed)
Pt called back to speak to CMA about status of rx being refilled and [facility] [address] [place]. Please call pt at [phone] once completed. Thank you.

## 2016-04-28 NOTE — Telephone Encounter (Signed)
Pt needs rx lisionpril refilled as soon as possible. Please call back when rx is filled and ready for pick up. He would like this rx sent to:   Pharmacy:  [facility] [address] - [place], [place] [id number] [phone] (Phone) [phone] (Fax)

## 2016-04-29 ENCOUNTER — Encounter (HOSPITAL_BASED_OUTPATIENT_CLINIC_OR_DEPARTMENT_OTHER): Payer: Self-pay | Admitting: *Deleted

## 2016-04-29 ENCOUNTER — Emergency Department (HOSPITAL_BASED_OUTPATIENT_CLINIC_OR_DEPARTMENT_OTHER)
Admission: EM | Admit: 2016-04-29 | Discharge: 2016-04-29 | Disposition: A | Discharge status: Home / Self Care | Payer: BLUE CROSS/BLUE SHIELD | Attending: Emergency Medicine | Admitting: Emergency Medicine

## 2016-04-29 DIAGNOSIS — Z760 Encounter for issue of repeat prescription: Secondary | ICD-10-CM | POA: Diagnosis not present

## 2016-04-29 DIAGNOSIS — I10 Essential (primary) hypertension: Secondary | ICD-10-CM | POA: Diagnosis not present

## 2016-04-29 MED ORDER — PANTOPRAZOLE SODIUM 40 MG PO TBEC
40.0000 mg | DELAYED_RELEASE_TABLET | Freq: Every day | ORAL | Status: DC
  Administered 2016-04-29: 40 mg via ORAL
  Filled 2016-04-29: qty 1

## 2016-04-29 MED ORDER — OMEPRAZOLE 20 MG PO CPDR: 20.0000 mg | DELAYED_RELEASE_CAPSULE | Freq: Two times a day (BID) | ORAL | 0 refills | Status: DC

## 2016-04-29 MED FILL — OMEPRAZOLE DR 20 MG CAPSULE: 20 | 15 days supply | Qty: 30 | Fill #0

## 2016-04-29 NOTE — ED Triage Notes (Signed)
Pt ran out of his gerd meds 4 days ago and reflux has become progressively worse. Pt does not see his PCP until the 15th. Pt denies any other symptoms and is requesting an Rx for omeprazole.

## 2016-04-29 NOTE — ED Provider Notes (Signed)
[facility]   CSN: [id number] Arrival date & time: 04/29/16  4098     History   Chief Complaint Chief Complaint  Patient presents with  . Gastroesophageal Reflux    HPI John Simmons is a 41 y.o. male.  HPI John Simmons is a 41 y.o. male with PMH significant for GERD who presents for medication fill of his 40 mg omeprazole.  Patient ran out 4 days ago and his acid reflux has flared up on him.  He has an appointment next week with his PCP.  He denies CP, SOB, dizziness, N/V and states this is his typical acid reflux.  Past Medical History:  Diagnosis Date  . Chronic neck pain   . GERD (gastroesophageal reflux disease)   . Gout   . Hypertension   . Hypertension     Patient Active Problem List   Diagnosis Date Noted  . Prostate cancer screening 12/21/2015  . Visit for preventive health examination 12/21/2015  . Lateral epicondylitis 12/21/2015  . Anxiety and depression 02/27/2015  . Essential hypertension, benign 03/08/2014  . GERD (gastroesophageal reflux disease) 03/08/2014    Past Surgical History:  Procedure Laterality Date  . HEMORRHOID BANDING  2016  . negative         Home Medications    Prior to Admission medications   Medication Sig Start Date End Date Taking? Authorizing Provider  atorvastatin (LIPITOR) 10 MG tablet TAKE ONE TABLET BY MOUTH ONCE DAILY 04/24/16   Waldon Merl, PA-C  colchicine 0.6 MG tablet Take 1 tablet (0.6 mg total) by mouth daily. 04/10/16   Courteney Lyn Mackuen, MD  esomeprazole (NEXIUM) 20 MG capsule Take 2 capsules (40 mg total) by mouth every morning. 11/02/13   Benjiman Core, MD  indomethacin (INDOCIN) 25 MG capsule Take 1 capsule (25 mg total) by mouth 3 (three) times daily as needed. 04/10/16   Courteney Lyn Mackuen, MD  lisinopril-hydrochlorothiazide (PRINZIDE,ZESTORETIC) 20-12.5 MG tablet TAKE ONE TABLET BY MOUTH ONCE DAILY 04/28/16   Waldon Merl, PA-C  omeprazole (PRILOSEC) 20 MG  capsule Take 1 capsule (20 mg total) by mouth 2 (two) times daily before a meal. 04/29/16   Cheri Fowler, PA-C  oxyCODONE-acetaminophen (PERCOCET/ROXICET) 5-325 MG tablet Take 1 tablet by mouth every 6 (six) hours as needed for severe pain. 04/10/16   Courteney Lyn Mackuen, MD  sildenafil (VIAGRA) 50 MG tablet Take 0.5 tablets (25 mg total) by mouth daily as needed for erectile dysfunction. 12/28/15   Waldon Merl, PA-C  tadalafil (CIALIS) 20 MG tablet Take 1 tablet (20 mg total) by mouth daily as needed for erectile dysfunction. 12/21/15   Waldon Merl, PA-C    Family History Family History  Problem Relation Age of Onset  . Hypertension Mother   . Diabetes Mother   . Hypertension Father     Social History Social History  Substance Use Topics  . Smoking status: Never Smoker  . Smokeless tobacco: Never Used  . Alcohol use No     Allergies   Patient has no known allergies.   Review of Systems Review of Systems All other systems negative unless otherwise stated in HPI   Physical Exam Updated Vital Signs BP 145/96 (BP Location: Right Arm)   Pulse 88   Temp 98.4 F (36.9 C) (Oral)   Resp 18   Ht 5\' 9"  (1.753 m)   Wt 97.5 kg   SpO2 99%   BMI 31.75 kg/m   Physical Exam  Constitutional: He is oriented to person, place, and time. He appears well-developed and well-nourished.  Non-toxic appearance. He does not have a sickly appearance. He does not appear ill.  HENT:  Head: Normocephalic and atraumatic.  Mouth/Throat: Oropharynx is clear and moist.  Eyes: Conjunctivae are normal.  Neck: Normal range of motion. Neck supple.  Cardiovascular: Normal rate and regular rhythm.   Pulmonary/Chest: Effort normal and breath sounds normal. No accessory muscle usage or stridor. No respiratory distress. He has no wheezes. He has no rhonchi. He has no rales.  Abdominal: Soft. Bowel sounds are normal. He exhibits no distension. There is no tenderness.  Musculoskeletal: Normal range of  motion.  Lymphadenopathy:    He has no cervical adenopathy.  Neurological: He is alert and oriented to person, place, and time.  Speech clear without dysarthria.  Skin: Skin is warm and dry.  Psychiatric: He has a normal mood and affect. His behavior is normal.     ED Treatments / Results  Labs (all labs ordered are listed, but only abnormal results are displayed) Labs Reviewed - No data to display  EKG  EKG Interpretation None       Radiology No results found.  Procedures Procedures (including critical care time)  Medications Ordered in ED Medications  pantoprazole (PROTONIX) EC tablet 40 mg (not administered)     Initial Impression / Assessment and Plan / ED Course  I have reviewed the triage vital signs and the nursing notes.  Pertinent labs & imaging results that were available during my care of the patient were reviewed by me and considered in my medical decision making (see chart for details).  Clinical Course    Pt here for refill of medication. Medication is not a controlled substance. Will refill medication here. Discussed need to follow up with PCP in 2-3 days.  Pt is safe for discharge at this time.   Final Clinical Impressions(s) / ED Diagnoses   Final diagnoses:  Encounter for medication refill    New Prescriptions New Prescriptions   OMEPRAZOLE (PRILOSEC) 20 MG CAPSULE    Take 1 capsule (20 mg total) by mouth 2 (two) times daily before a meal.     Cheri FowlerKayla Andrell Tallman, PA-C 04/29/16 1128    Nira ConnPedro Eduardo Cardama, MD 04/30/16 1139

## 2016-05-05 ENCOUNTER — Ambulatory Visit (INDEPENDENT_AMBULATORY_CARE_PROVIDER_SITE_OTHER): Payer: BLUE CROSS/BLUE SHIELD | Admitting: Physician Assistant

## 2016-05-05 ENCOUNTER — Encounter: Payer: Self-pay | Admitting: Physician Assistant

## 2016-05-05 ENCOUNTER — Other Ambulatory Visit: Payer: Self-pay | Admitting: Physician Assistant

## 2016-05-05 ENCOUNTER — Encounter: Payer: Self-pay | Admitting: Emergency Medicine

## 2016-05-05 VITALS — BP 124/84 | HR 68 | Temp 98.2°F | Resp 16 | Ht 68.75 in | Wt 220.0 lb

## 2016-05-05 DIAGNOSIS — E785 Hyperlipidemia, unspecified: Secondary | ICD-10-CM | POA: Diagnosis not present

## 2016-05-05 DIAGNOSIS — I10 Essential (primary) hypertension: Secondary | ICD-10-CM

## 2016-05-05 LAB — COMPREHENSIVE METABOLIC PANEL
ALT: 17 U/L (ref 0–53)
AST: 15 U/L (ref 0–37)
Albumin: 4.3 g/dL (ref 3.5–5.2)
Alkaline Phosphatase: 72 U/L (ref 39–117)
BUN: 16 mg/dL (ref 6–23)
CHLORIDE: 102 meq/L (ref 96–112)
CO2: 27 meq/L (ref 19–32)
CREATININE: 1.21 mg/dL (ref 0.40–1.50)
Calcium: 9.2 mg/dL (ref 8.4–10.5)
GFR: 84.34 mL/min (ref >60.00)
Glucose, Bld: 94 mg/dL (ref 70–99)
Potassium: 3.9 mEq/L (ref 3.5–5.1)
SODIUM: 136 meq/L (ref 135–145)
Total Bilirubin: 0.5 mg/dL (ref 0.2–1.2)
Total Protein: 7.1 g/dL (ref 6.0–8.3)

## 2016-05-05 MED ORDER — LISINOPRIL 20 MG PO TABS: 20.0000 mg | ORAL_TABLET | Freq: Every day | ORAL | 1 refills | Status: DC

## 2016-05-05 NOTE — Assessment & Plan Note (Signed)
Stable today. Asymptomatic. Patient working on diet and exercise changes to help with BP. Concern HCTZ is triggering gout flares. Will change to plain lisinopril 20 mg daily. DASH diet encouraged. Repeat BP at FU 1 month.

## 2016-05-05 NOTE — Patient Instructions (Signed)
Please stop the lisinopril-HCTZ.  Start the plain lisinopril. Continue dietary changes and exercise regimen. Stay well-hydrated.   Follow-up with me in 1 month for reassessment of your BP.

## 2016-05-05 NOTE — Progress Notes (Signed)
Pre visit review using our clinic review tool, if applicable. No additional management support is needed unless otherwise documented below in the visit note. 

## 2016-05-05 NOTE — Assessment & Plan Note (Signed)
Tolerating statin well. Will check CMP today. Continue diet and exercise changes. FU 1 month. Will check fasting lipid panel at that time.

## 2016-05-05 NOTE — Progress Notes (Signed)
Patient presents to clinic today for follow-up of hypertension, hyperlipidemia. Patient is currently on lisinopril-HCTZ 20-12.5 mg once daily. Is also taking Lipitor 10 mg daily. Is taking as directed. Tolerating medications well overall. Has started a vegan diet. Is getting protein from peanut butter and beans. Is exercising 3 x week, doing a mixture of cardio and resistance training. Spending about 45 minutes each time in the gym. Hopes to increase time spent.  Body mass index is 32.73 kg/m.  Of note patient with history of acute gout attacks, previously considered diet-induced. Endorses rare attack but has had a couple in the past month before changing diet.  No current symptoms.    Past Medical History:  Diagnosis Date  . Chronic neck pain   . GERD (gastroesophageal reflux disease)   . Gout   . Hypertension     Current Outpatient Prescriptions on File Prior to Visit  Medication Sig Dispense Refill  . atorvastatin (LIPITOR) 10 MG tablet TAKE ONE TABLET BY MOUTH ONCE DAILY 30 tablet 2  . omeprazole (PRILOSEC) 20 MG capsule Take 1 capsule (20 mg total) by mouth 2 (two) times daily before a meal. 30 capsule 0  . oxyCODONE-acetaminophen (PERCOCET/ROXICET) 5-325 MG tablet Take 1 tablet by mouth every 6 (six) hours as needed for severe pain. 7 tablet 0  . sildenafil (VIAGRA) 50 MG tablet Take 0.5 tablets (25 mg total) by mouth daily as needed for erectile dysfunction. 10 tablet 0   No current facility-administered medications on file prior to visit.     No Known Allergies  Family History  Problem Relation Age of Onset  . Hypertension Mother   . Diabetes Mother   . Hypertension Father     Social History   Social History  . Marital status: Married    Spouse name: N/A  . Number of children: N/A  . Years of education: N/A   Social History Main Topics  . Smoking status: Never Smoker  . Smokeless tobacco: Never Used  . Alcohol use No  . Drug use: No  . Sexual activity: Not  Asked   Other Topics Concern  . None   Social History Narrative  . None   Review of Systems - See HPI.  All other ROS are negative.  BP 124/84   Pulse 68   Temp 98.2 F (36.8 C) (Oral)   Resp 16   Ht 5' 8.75" (1.746 m)   Wt 220 lb (99.8 kg)   SpO2 98%   BMI 32.73 kg/m   Physical Exam  Constitutional: He is oriented to person, place, and time and well-developed, well-nourished, and in no distress.  HENT:  Head: Normocephalic and atraumatic.  Eyes: Conjunctivae are normal.  Cardiovascular: Normal rate, regular rhythm, normal heart sounds and intact distal pulses.   Pulmonary/Chest: Effort normal and breath sounds normal. No respiratory distress. He has no wheezes. He has no rales. He exhibits no tenderness.  Neurological: He is alert and oriented to person, place, and time.  Skin: Skin is warm and dry. No rash noted.  Psychiatric: Affect normal.  Vitals reviewed.  Assessment/Plan: Hyperlipidemia Tolerating statin well. Will check CMP today. Continue diet and exercise changes. FU 1 month. Will check fasting lipid panel at that time.   Essential hypertension, benign Stable today. Asymptomatic. Patient working on diet and exercise changes to help with BP. Concern HCTZ is triggering gout flares. Will change to plain lisinopril 20 mg daily. DASH diet encouraged. Repeat BP at FU 1 month.  Leeanne Rio, PA-C

## 2016-05-06 ENCOUNTER — Encounter: Payer: Self-pay | Admitting: Emergency Medicine

## 2016-07-07 ENCOUNTER — Emergency Department (HOSPITAL_BASED_OUTPATIENT_CLINIC_OR_DEPARTMENT_OTHER)
Admission: EM | Admit: 2016-07-07 | Discharge: 2016-07-07 | Disposition: A | Discharge status: Home / Self Care | Payer: BLUE CROSS/BLUE SHIELD | Attending: Emergency Medicine | Admitting: Emergency Medicine

## 2016-07-07 ENCOUNTER — Encounter (HOSPITAL_BASED_OUTPATIENT_CLINIC_OR_DEPARTMENT_OTHER): Payer: Self-pay

## 2016-07-07 DIAGNOSIS — Z79899 Other long term (current) drug therapy: Secondary | ICD-10-CM | POA: Insufficient documentation

## 2016-07-07 DIAGNOSIS — Y939 Activity, unspecified: Secondary | ICD-10-CM | POA: Insufficient documentation

## 2016-07-07 DIAGNOSIS — Y999 Unspecified external cause status: Secondary | ICD-10-CM | POA: Insufficient documentation

## 2016-07-07 DIAGNOSIS — S0081XA Abrasion of other part of head, initial encounter: Secondary | ICD-10-CM

## 2016-07-07 DIAGNOSIS — I10 Essential (primary) hypertension: Secondary | ICD-10-CM | POA: Diagnosis not present

## 2016-07-07 DIAGNOSIS — S0990XA Unspecified injury of head, initial encounter: Secondary | ICD-10-CM | POA: Diagnosis not present

## 2016-07-07 DIAGNOSIS — Y929 Unspecified place or not applicable: Secondary | ICD-10-CM | POA: Diagnosis not present

## 2016-07-07 DIAGNOSIS — Z23 Encounter for immunization: Secondary | ICD-10-CM | POA: Insufficient documentation

## 2016-07-07 MED ORDER — TETANUS-DIPHTH-ACELL PERTUSSIS 5-2.5-18.5 LF-MCG/0.5 IM SUSP
0.5000 mL | Freq: Once | INTRAMUSCULAR | Status: AC
  Administered 2016-07-07: 0.5 mL via INTRAMUSCULAR
  Filled 2016-07-07: qty 0.5
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
## 2016-07-07 NOTE — Discharge Instructions (Signed)
Please read and follow all provided instructions.  Your diagnoses today include:  1. Injury of head, initial encounter   2. Facial abrasion, initial encounter     Tests performed today include:  CT scan of your head that did not show any serious injury.  Vital signs. See below for your results today.   Medications prescribed:   None  Take any prescribed medications only as directed.  Home care instructions:  Follow any educational materials contained in this packet.  BE VERY CAREFUL not to take multiple medicines containing Tylenol (also called acetaminophen). Doing so can lead to an overdose which can damage your liver and cause liver failure and possibly death.   Follow-up instructions: Please follow-up with your primary care provider in the next 3 days for further evaluation of your symptoms.   Return instructions:  SEEK IMMEDIATE MEDICAL ATTENTION IF:  There is confusion or drowsiness (although children frequently become drowsy after injury).   You cannot awaken the injured person.   You have more than one episode of vomiting.   You notice dizziness or unsteadiness which is getting worse, or inability to walk.   You have convulsions or unconsciousness.   You experience severe, persistent headaches not relieved by Tylenol.  You cannot use arms or legs normally.   There are changes in pupil sizes. (This is the black center in the colored part of the eye)   There is clear or bloody discharge from the nose or ears.   You have change in speech, vision, swallowing, or understanding.   Localized weakness, numbness, tingling, or change in bowel or bladder control.  You have any other emergent concerns.  Additional Information: You have had a head injury which does not appear to require admission at this time.  Your vital signs today were: BP (!) 141/101 (BP Location: Right Arm)    Pulse 98    Temp 98.3 F (36.8 C) (Oral)    Resp 18    Ht 5\' 9"  (1.753 m)    Wt  97.5 kg    SpO2 100%    BMI 31.75 kg/m  If your blood pressure (BP) was elevated above 135/85 this visit, please have this repeated by your doctor within one month. --------------
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
## 2016-07-21 ENCOUNTER — Other Ambulatory Visit: Payer: Self-pay | Admitting: Physician Assistant

## 2016-07-21 DIAGNOSIS — I10 Essential (primary) hypertension: Secondary | ICD-10-CM

## 2016-07-26 ENCOUNTER — Other Ambulatory Visit: Payer: Self-pay | Admitting: Physician Assistant

## 2016-08-08 ENCOUNTER — Telehealth: Payer: Self-pay | Admitting: Physician Assistant

## 2016-08-08 MED ORDER — COLCHICINE 0.6 MG PO TABS: ORAL_TABLET | ORAL | 1 refills | Status: DC

## 2016-08-08 NOTE — Telephone Encounter (Signed)
Pt requesting refill of Colchicine 0.6mg  for gout flare up. He states he was previously prescribed this while at the [facility] ED.  Per Patina, patient instructed he was due to follow up with pcp in February.  Offered to make appt.  However, pt states he would need to call back as he has to get vacation day approved a work before he could scheduled appt but he is planning to come back in the first of May.  Pharmacy:  [facility] [address] [place], [place] - [address] [address] [phone] (Phone) [phone] (Fax)

## 2016-08-08 NOTE — Telephone Encounter (Signed)
Advised patient not to take the Atorvastatin while taking the Colchine. He is agreeable.

## 2016-08-08 NOTE — Telephone Encounter (Signed)
I have sent in colchicine for treatment of acute gout. He is to not take his Atorvastatin for a few days during and after colchicine use.

## 2016-08-11 NOTE — Telephone Encounter (Signed)
LMOVM patient needs an appointment for re-evaluation to determine if gout is what is going on and Blood pressure.

## 2016-08-11 NOTE — Telephone Encounter (Signed)
Pt states that the Colchine is not working for his gout pain and asking if Selena Batten could call in the other Rx that the hosp had gave him about and month or two ago. Pt states that he is not able to get off work to make an appt right now.

## 2016-08-11 NOTE — Telephone Encounter (Signed)
Pt calling checking status on this. °

## 2016-08-12 ENCOUNTER — Encounter (HOSPITAL_BASED_OUTPATIENT_CLINIC_OR_DEPARTMENT_OTHER): Payer: Self-pay | Admitting: *Deleted

## 2016-08-12 ENCOUNTER — Emergency Department (HOSPITAL_BASED_OUTPATIENT_CLINIC_OR_DEPARTMENT_OTHER)
Admission: EM | Admit: 2016-08-12 | Discharge: 2016-08-12 | Disposition: A | Discharge status: Home / Self Care | Payer: BLUE CROSS/BLUE SHIELD | Attending: Emergency Medicine | Admitting: Emergency Medicine

## 2016-08-12 DIAGNOSIS — M79672 Pain in left foot: Secondary | ICD-10-CM | POA: Diagnosis present

## 2016-08-12 DIAGNOSIS — Z79899 Other long term (current) drug therapy: Secondary | ICD-10-CM | POA: Insufficient documentation

## 2016-08-12 DIAGNOSIS — M109 Gout, unspecified: Secondary | ICD-10-CM

## 2016-08-12 DIAGNOSIS — I10 Essential (primary) hypertension: Secondary | ICD-10-CM | POA: Insufficient documentation

## 2016-08-12 DIAGNOSIS — M10072 Idiopathic gout, left ankle and foot: Secondary | ICD-10-CM | POA: Diagnosis not present

## 2016-08-12 MED ORDER — INDOMETHACIN 25 MG PO CAPS: 25.0000 mg | ORAL_CAPSULE | Freq: Three times a day (TID) | ORAL | 0 refills | Status: DC | PRN

## 2016-08-12 MED ORDER — OXYCODONE-ACETAMINOPHEN 5-325 MG PO TABS: 1.0000 | ORAL_TABLET | ORAL | 0 refills | Status: DC | PRN

## 2016-08-12 MED ORDER — KETOROLAC TROMETHAMINE 60 MG/2ML IM SOLN
60.0000 mg | Freq: Once | INTRAMUSCULAR | Status: AC
  Administered 2016-08-12: 60 mg via INTRAMUSCULAR
  Filled 2016-08-12: qty 2

## 2016-08-12 MED FILL — INDOMETHACIN 25 MG CAPSULE: 25 | 4 days supply | Qty: 20 | Fill #0

## 2016-08-12 MED FILL — OXYCODONE/APAP 5/325 MG TAB: 5-325 | 2 days supply | Qty: 8 | Fill #0

## 2016-08-12 NOTE — ED Triage Notes (Signed)
Left ankle and foot pain. States he knows he has gout.

## 2016-08-12 NOTE — ED Provider Notes (Signed)
[facility]   CSN: [id number] Arrival date & time: 08/12/16  1218     History   Chief Complaint Chief Complaint  Patient presents with  . Gout    HPI John Simmons is a 41 y.o. male.  HPI   Patient with hx gout p/w gout flare.  Reports pain in his left foot, great toe MTP and in his ankle.  States this is identical to his prior gout flares.  Has taken ibuprofen and colchicine without improvement.  Deneis fevers, injury to the foot, leg swelling.    Past Medical History:  Diagnosis Date  . Chronic neck pain   . GERD (gastroesophageal reflux disease)   . Gout   . Hypertension     Patient Active Problem List   Diagnosis Date Noted  . Hyperlipidemia 05/05/2016  . Prostate cancer screening 12/21/2015  . Visit for preventive health examination 12/21/2015  . Anxiety and depression 02/27/2015  . Essential hypertension, benign 03/08/2014  . GERD (gastroesophageal reflux disease) 03/08/2014    Past Surgical History:  Procedure Laterality Date  . HEMORRHOID BANDING  2016  . negative         Home Medications    Prior to Admission medications   Medication Sig Start Date End Date Taking? Authorizing Provider  atorvastatin (LIPITOR) 10 MG tablet TAKE ONE TABLET BY MOUTH ONCE DAILY 07/28/16   Waldon Merl, PA-C  colchicine 0.6 MG tablet Take 2 tablets by mouth. Take 3rd table 1 hour later. 08/08/16   Waldon Merl, PA-C  indomethacin (INDOCIN) 25 MG capsule Take 1-2 capsules (25-50 mg total) by mouth 3 (three) times daily as needed (gout attack). 08/12/16   Trixie Dredge, PA-C  lisinopril (PRINIVIL,ZESTRIL) 20 MG tablet TAKE ONE TABLET BY MOUTH ONCE DAILY 07/21/16   Waldon Merl, PA-C  omeprazole (PRILOSEC) 20 MG capsule Take 1 capsule (20 mg total) by mouth 2 (two) times daily before a meal. 04/29/16   Cheri Fowler, PA-C  oxyCODONE-acetaminophen (PERCOCET/ROXICET) 5-325 MG tablet Take 1 tablet by mouth every 4 (four) hours as needed for severe  pain. 08/12/16   Trixie Dredge, PA-C  sildenafil (VIAGRA) 50 MG tablet Take 0.5 tablets (25 mg total) by mouth daily as needed for erectile dysfunction. 12/28/15   Waldon Merl, PA-C    Family History Family History  Problem Relation Age of Onset  . Hypertension Mother   . Diabetes Mother   . Hypertension Father     Social History Social History  Substance Use Topics  . Smoking status: Never Smoker  . Smokeless tobacco: Never Used  . Alcohol use No     Comment: occasionally     Allergies   Patient has no known allergies.   Review of Systems Review of Systems  All other systems reviewed and are negative.    Physical Exam Updated Vital Signs BP (!) 139/104   Pulse 70   Temp 98.2 F (36.8 C) (Oral)   Resp 20   Ht  (1.753 m)   Wt 97.5 kg   SpO2 100%   BMI 31.75 kg/m   Physical Exam  Constitutional: He appears well-developed and well-nourished. No distress.  HENT:  Head: Normocephalic and atraumatic.  Neck: Neck supple.  Pulmonary/Chest: Effort normal.  Musculoskeletal:  Left foot with erythema, warmth, tenderness at 1st MTP and medial ankle.  Erythema extends from MTP through medial ankle.  Pulses intact.  No calf tenderness or edema.  Sensation intact.  Skin intact.  Neurological: He is alert.  Skin: He is not diaphoretic.  Nursing note and vitals reviewed.    ED Treatments / Results  Labs (all labs ordered are listed, but only abnormal results are displayed) Labs Reviewed - No data to display  EKG  EKG Interpretation None       Radiology No results found.  Procedures Procedures (including critical care time)  Medications Ordered in ED Medications  ketorolac (TORADOL) injection 60 mg (60 mg Intramuscular Given 08/12/16 1313)     Initial Impression / Assessment and Plan / ED Course  I have reviewed the triage vital signs and the nursing notes.  Pertinent labs & imaging results that were available during my care of the patient were  reviewed by me and considered in my medical decision making (see chart for details).  Clinical Course as of Aug 12 1728  Tue Aug 12, 2016  1322 Checked on dea database   [EW]    Clinical Course User Index [EW] Trixie Dredge, [place]    Afebrile, nontoxic patient with typical gout flare.  Pt notes erythema does typically extend between two affected joints.  No injury.  Clinically doubt septic joint.  Also much less likely cellulitis.  D/C home with indomethacin, percocet for breakthrough pain, PCP follow up.  Discussed result, findings, treatment, and follow up  with patient.  Pt given return precautions.  Pt verbalizes understanding and agrees with plan.       Final Clinical Impressions(s) / ED Diagnoses   Final diagnoses:  Acute gout of left foot, unspecified cause    New Prescriptions Discharge Medication List as of 08/12/2016  1:19 PM    START taking these medications   Details  indomethacin (INDOCIN) 25 MG capsule Take 1-2 capsules (25-50 mg total) by mouth 3 (three) times daily as needed (gout attack)., Starting Tue 08/12/2016, Print         [place], PA-C 08/12/16 1732    Rolland Porter, MD 08/23/16 [phone]

## 2016-08-12 NOTE — Discharge Instructions (Signed)

## 2016-08-14 NOTE — Telephone Encounter (Signed)
FYI: instead of patient scheduling an appointment with [place] for evaluation of the gout, he went to the ER.

## 2016-08-20 ENCOUNTER — Telehealth: Payer: Self-pay | Admitting: Physician Assistant

## 2016-08-20 NOTE — Telephone Encounter (Signed)
Pt states that he finished all of indocin for his gout pain and states that he is still in a lot of pain and asking what else can her do.

## 2016-08-20 NOTE — Telephone Encounter (Signed)
He needs appointment. I have not seen him for this current issue. He went to the ER.

## 2016-08-21 DIAGNOSIS — M109 Gout, unspecified: Secondary | ICD-10-CM | POA: Diagnosis not present

## 2016-08-21 DIAGNOSIS — I10 Essential (primary) hypertension: Secondary | ICD-10-CM | POA: Diagnosis not present

## 2016-08-21 DIAGNOSIS — K219 Gastro-esophageal reflux disease without esophagitis: Secondary | ICD-10-CM | POA: Diagnosis not present

## 2016-08-22 NOTE — Telephone Encounter (Signed)
LMOVM for pt to call back advising pt to schedule an appt.

## 2016-09-02 ENCOUNTER — Emergency Department (HOSPITAL_BASED_OUTPATIENT_CLINIC_OR_DEPARTMENT_OTHER)
Admission: EM | Admit: 2016-09-02 | Discharge: 2016-09-02 | Disposition: A | Discharge status: Home / Self Care | Payer: BLUE CROSS/BLUE SHIELD | Attending: Emergency Medicine | Admitting: Emergency Medicine

## 2016-09-02 ENCOUNTER — Encounter (HOSPITAL_BASED_OUTPATIENT_CLINIC_OR_DEPARTMENT_OTHER): Payer: Self-pay | Admitting: *Deleted

## 2016-09-02 DIAGNOSIS — I10 Essential (primary) hypertension: Secondary | ICD-10-CM | POA: Diagnosis not present

## 2016-09-02 DIAGNOSIS — K219 Gastro-esophageal reflux disease without esophagitis: Secondary | ICD-10-CM

## 2016-09-02 DIAGNOSIS — Z79899 Other long term (current) drug therapy: Secondary | ICD-10-CM | POA: Diagnosis not present

## 2016-09-02 MED ORDER — FAMOTIDINE 20 MG PO TABS
20.0000 mg | ORAL_TABLET | Freq: Once | ORAL | Status: AC
  Administered 2016-09-02: 20 mg via ORAL
  Filled 2016-09-02: qty 1

## 2016-09-02 MED ORDER — OMEPRAZOLE 20 MG PO CPDR
20.0000 mg | DELAYED_RELEASE_CAPSULE | Freq: Two times a day (BID) | ORAL | 0 refills | Status: DC
Start: 2016-09-02 — End: 2017-02-24

## 2016-09-02 MED ORDER — ALUM & MAG HYDROXIDE-SIMETH 200-200-20 MG/5ML PO SUSP
15.0000 mL | Freq: Once | ORAL | Status: AC
  Administered 2016-09-02: 15 mL via ORAL
  Filled 2016-09-02: qty 30

## 2016-09-02 NOTE — ED Provider Notes (Signed)
[facility]   CSN: [id number] Arrival date & time: 09/02/16  1012     History   Chief Complaint Chief Complaint  Patient presents with  . Gastroesophageal Reflux    HPI John Simmons is a 41 y.o. male.  Patient c/o 'heartburn' for he past several days.  States he has had this for years and has on a daily basis. States same as prior gerd symptoms. Has seen gi for same in past, including egd.  States when he takes his prilosec his symptoms are controlled, but that he has been out of his prilosec in the past few days. Symptoms are at rest. No relation to activity or exertion. No associated sob, nv or diaphoresis. No pleuritic pain. No leg pain or swelling. No unusual doe or fatigue. No personal or family hx cad. Non smoker.    The history is provided by the patient.  Gastroesophageal Reflux  Pertinent negatives include no abdominal pain, no headaches and no shortness of breath.    Past Medical History:  Diagnosis Date  . Chronic neck pain   . GERD (gastroesophageal reflux disease)   . Gout   . Hypertension     Patient Active Problem List   Diagnosis Date Noted  . Hyperlipidemia 05/05/2016  . Prostate cancer screening 12/21/2015  . Visit for preventive health examination 12/21/2015  . Anxiety and depression 02/27/2015  . Essential hypertension, benign 03/08/2014  . GERD (gastroesophageal reflux disease) 03/08/2014    Past Surgical History:  Procedure Laterality Date  . HEMORRHOID BANDING  2016  . negative         Home Medications    Prior to Admission medications   Medication Sig Start Date End Date Taking? Authorizing Provider  atorvastatin (LIPITOR) 10 MG tablet TAKE ONE TABLET BY MOUTH ONCE DAILY 07/28/16  Yes Waldon Merl, PA-C  colchicine 0.6 MG tablet Take 2 tablets by mouth. Take 3rd table 1 hour later. 08/08/16  Yes Waldon Merl, PA-C  indomethacin (INDOCIN) 25 MG capsule Take 1-2 capsules (25-50 mg total) by mouth 3  (three) times daily as needed (gout attack). 08/12/16  Yes West, Emily, PA-C  lisinopril (PRINIVIL,ZESTRIL) 20 MG tablet TAKE ONE TABLET BY MOUTH ONCE DAILY 07/21/16  Yes Waldon Merl, PA-C  oxyCODONE-acetaminophen (PERCOCET/ROXICET) 5-325 MG tablet Take 1 tablet by mouth every 4 (four) hours as needed for severe pain. 08/12/16  Yes West, Emily, PA-C  sildenafil (VIAGRA) 50 MG tablet Take 0.5 tablets (25 mg total) by mouth daily as needed for erectile dysfunction. 12/28/15  Yes Waldon Merl, PA-C  omeprazole (PRILOSEC) 20 MG capsule Take 1 capsule (20 mg total) by mouth 2 (two) times daily before a meal. 04/29/16   Cheri Fowler, PA-C    Family History Family History  Problem Relation Age of Onset  . Hypertension Mother   . Diabetes Mother   . Hypertension Father     Social History Social History  Substance Use Topics  . Smoking status: Never Smoker  . Smokeless tobacco: Never Used  . Alcohol use No     Comment: occasionally     Allergies   Patient has no known allergies.   Review of Systems Review of Systems  Constitutional: Negative for fever.  HENT: Negative for sore throat.   Eyes: Negative for redness.  Respiratory: Negative for cough and shortness of breath.   Cardiovascular: Negative for leg swelling.  Gastrointestinal: Negative for abdominal pain and vomiting.  Genitourinary: Negative for flank  pain.  Musculoskeletal: Negative for back pain and neck pain.  Skin: Negative for rash.  Neurological: Negative for headaches.  Hematological: Does not bruise/bleed easily.  Psychiatric/Behavioral: Negative for confusion.     Physical Exam Updated Vital Signs BP (!) 145/105 (BP Location: Left Arm)   Pulse 75   Temp 98.2 F (36.8 C)   Resp 18   Ht 5\' 9"  (1.753 m)   Wt 99.8 kg   SpO2 100%   BMI 32.49 kg/m   Physical Exam  Constitutional: He appears well-developed and well-nourished. No distress.  HENT:  Mouth/Throat: Oropharynx is clear and moist.  Eyes:  Conjunctivae are normal.  Neck: Neck supple. No tracheal deviation present.  Cardiovascular: Normal rate, regular rhythm, normal heart sounds and intact distal pulses.  Exam reveals no gallop and no friction rub.   No murmur heard. Pulmonary/Chest: Effort normal and breath sounds normal. No accessory muscle usage. No respiratory distress.  Abdominal: Soft. Bowel sounds are normal. He exhibits no distension. There is no tenderness. There is no guarding.  Musculoskeletal: He exhibits no edema.  Neurological: He is alert.  Skin: Skin is warm and dry. He is not diaphoretic.  Psychiatric: He has a normal mood and affect.  Nursing note and vitals reviewed.    ED Treatments / Results  Labs (all labs ordered are listed, but only abnormal results are displayed) Labs Reviewed - No data to display  EKG  EKG Interpretation  Date/Time:  Tuesday Sep 02 2016 10:42:10 EDT Ventricular Rate:  67 PR Interval:    QRS Duration: 88 QT Interval:  369 QTC Calculation: 390 R Axis:   78 Text Interpretation:  Sinus rhythm Consider left ventricular hypertrophy Nonspecific T wave abnormality No significant change since last tracing Confirmed by Denton LankSTEINL  MD, Caryn BeeKEVIN ([id number]) on 09/02/2016 11:07:19 AM       Radiology No results found.  Procedures Procedures (including critical care time)  Medications Ordered in ED Medications  famotidine (PEPCID) tablet 20 mg (not administered)  alum & mag hydroxide-simeth (MAALOX/MYLANTA) 200-200-20 MG/5ML suspension 15 mL (not administered)     Initial Impression / Assessment and Plan / ED Course  I have reviewed the triage vital signs and the nursing notes.  Pertinent labs & imaging results that were available during my care of the patient were reviewed by me and considered in my medical decision making (see chart for details).  Pt states he is having the same gerd symptoms he has had for years, and requests refill of his prilosec.    Final Clinical  Impressions(s) / ED Diagnoses   Final diagnoses:  None    New Prescriptions New Prescriptions   No medications on file     Cathren LaineSteinl, Clayson Riling, MD 09/02/16 1108

## 2016-09-02 NOTE — Discharge Instructions (Signed)
It was our pleasure to provide your ER care today - we hope that you feel better.  Take prilosec as prescribed.  You may also try pepcid and maalox as need for symptom relief.  Follow up with your doctor/gi doctor in the next couple weeks.  Return to ER if worse, new symptoms, exertional chest pain or discomfort, shortness of breath, other concern.

## 2016-09-02 NOTE — ED Triage Notes (Signed)
Pt reports being out of his Prilosec for 3-4 days and has had severe heartburn since then. Denies sob, n/v.

## 2016-09-04 DIAGNOSIS — M109 Gout, unspecified: Secondary | ICD-10-CM | POA: Diagnosis not present

## 2016-09-04 DIAGNOSIS — E782 Mixed hyperlipidemia: Secondary | ICD-10-CM | POA: Diagnosis not present

## 2016-11-28 DIAGNOSIS — E782 Mixed hyperlipidemia: Secondary | ICD-10-CM | POA: Diagnosis not present

## 2016-11-28 DIAGNOSIS — M109 Gout, unspecified: Secondary | ICD-10-CM | POA: Diagnosis not present

## 2016-11-28 DIAGNOSIS — I10 Essential (primary) hypertension: Secondary | ICD-10-CM | POA: Diagnosis not present

## 2016-12-01 NOTE — Telephone Encounter (Signed)
Can you close this encounter?

## 2017-01-15 ENCOUNTER — Other Ambulatory Visit: Payer: Self-pay | Admitting: Physician Assistant

## 2017-01-16 DIAGNOSIS — M109 Gout, unspecified: Secondary | ICD-10-CM | POA: Diagnosis not present

## 2017-01-16 NOTE — Telephone Encounter (Signed)
LMOVM advising patient he is due for CPE. Please call the office to schedule an CPE

## 2017-01-16 NOTE — Telephone Encounter (Signed)
Pt called and schedule his CPE for 11/6 @ 9:30.

## 2017-01-24 ENCOUNTER — Encounter (HOSPITAL_BASED_OUTPATIENT_CLINIC_OR_DEPARTMENT_OTHER): Payer: Self-pay | Admitting: *Deleted

## 2017-01-24 ENCOUNTER — Emergency Department (HOSPITAL_BASED_OUTPATIENT_CLINIC_OR_DEPARTMENT_OTHER): Payer: BLUE CROSS/BLUE SHIELD

## 2017-01-24 ENCOUNTER — Emergency Department (HOSPITAL_BASED_OUTPATIENT_CLINIC_OR_DEPARTMENT_OTHER)
Admission: EM | Admit: 2017-01-24 | Discharge: 2017-01-24 | Disposition: A | Discharge status: Home / Self Care | Payer: BLUE CROSS/BLUE SHIELD | Attending: Emergency Medicine | Admitting: Emergency Medicine

## 2017-01-24 DIAGNOSIS — M542 Cervicalgia: Secondary | ICD-10-CM | POA: Diagnosis not present

## 2017-01-24 DIAGNOSIS — R202 Paresthesia of skin: Secondary | ICD-10-CM

## 2017-01-24 DIAGNOSIS — I10 Essential (primary) hypertension: Secondary | ICD-10-CM | POA: Insufficient documentation

## 2017-01-24 DIAGNOSIS — Z79899 Other long term (current) drug therapy: Secondary | ICD-10-CM | POA: Insufficient documentation

## 2017-01-24 DIAGNOSIS — R200 Anesthesia of skin: Secondary | ICD-10-CM | POA: Diagnosis present

## 2017-01-24 LAB — CBC
HEMATOCRIT: 39.9 % (ref 39.0–52.0)
Hemoglobin: 14.3 g/dL (ref 13.0–17.0)
MCH: 31.2 pg (ref 26.0–34.0)
MCHC: 35.8 g/dL (ref 30.0–36.0)
MCV: 86.9 fL (ref 78.0–100.0)
PLATELETS: 237 10*3/uL (ref 150–400)
RBC: 4.59 MIL/uL (ref 4.22–5.81)
RDW: 12.2 % (ref 11.5–15.5)
WBC: 4.9 10*3/uL (ref 4.0–10.5)

## 2017-01-24 LAB — COMPREHENSIVE METABOLIC PANEL
ALBUMIN: 4.3 g/dL (ref 3.5–5.0)
ALT: 17 U/L (ref 17–63)
AST: 18 U/L (ref 15–41)
Alkaline Phosphatase: 68 U/L (ref 38–126)
Anion gap: 8 (ref 5–15)
BILIRUBIN TOTAL: 0.8 mg/dL (ref 0.3–1.2)
BUN: 15 mg/dL (ref 6–20)
CALCIUM: 9.1 mg/dL (ref 8.9–10.3)
CO2: 25 mmol/L (ref 22–32)
CREATININE: 1.17 mg/dL (ref 0.61–1.24)
Chloride: 102 mmol/L (ref 101–111)
GFR calc Af Amer: >60 mL/min (ref >60)
GLUCOSE: 85 mg/dL (ref 65–99)
Potassium: 3.5 mmol/L (ref 3.5–5.1)
Sodium: 135 mmol/L (ref 135–145)
TOTAL PROTEIN: 7.5 g/dL (ref 6.5–8.1)

## 2017-01-24 LAB — TROPONIN I: Troponin I: <0.03 ng/mL (ref <0.03)

## 2017-01-24 MED ORDER — LISINOPRIL 10 MG PO TABS
20.0000 mg | ORAL_TABLET | Freq: Once | ORAL | Status: AC
  Administered 2017-01-24: 20 mg via ORAL
  Filled 2017-01-24: qty 2

## 2017-01-24 NOTE — ED Notes (Signed)
ED Provider at bedside. 

## 2017-01-24 NOTE — ED Triage Notes (Signed)
Pt reports L neck/throat pain and numbness for several days; states it's now extending into his face. No redness, rash, swelling noted. Denies sob. Grips equal, face symmetrical.

## 2017-01-24 NOTE — ED Notes (Signed)
Pt to XR

## 2017-01-24 NOTE — ED Provider Notes (Signed)
[facility] Provider Note   CSN: [id number] Arrival date & time: 01/24/17  1851     History   Chief Complaint Chief Complaint  Patient presents with  . Numbness    HPI John Simmons is a 41 y.o. male.  Patient c/o a painful, tingling sensation to upper chest, base of neck area for the past 2-3 days. Symptoms gradual onset, constant, occur at rest, no specific exacerbating or alleviating factors. Denies any unilateral numbness or weakness. No change in speech or vision. No change in normal functional ability. Pt indicates is not a chest pain, but rather a numbness/burning/tingling sensation localized to small area in upper chest/neck. No associated sob, nv or diaphoresis. Denies heartburn, ?hx gerd. No exertional symptoms. No unusual doe. Denies leg pain or swelling. No hx cad or fam hx premature cad. No headache. No trouble swallowing. Denies neck or throat swelling.    The history is provided by the patient.    Past Medical History:  Diagnosis Date  . Chronic neck pain   . GERD (gastroesophageal reflux disease)   . Gout   . Hypertension     Patient Active Problem List   Diagnosis Date Noted  . Hyperlipidemia 05/05/2016  . Prostate cancer screening 12/21/2015  . Visit for preventive health examination 12/21/2015  . Anxiety and depression 02/27/2015  . Essential hypertension, benign 03/08/2014  . GERD (gastroesophageal reflux disease) 03/08/2014    Past Surgical History:  Procedure Laterality Date  . HEMORRHOID BANDING  2016  . negative         Home Medications    Prior to Admission medications   Medication Sig Start Date End Date Taking? Authorizing Provider  atorvastatin (LIPITOR) 10 MG tablet TAKE ONE TABLET BY MOUTH ONCE DAILY 07/28/16  Yes Waldon Merl, PA-C  CIALIS 20 MG tablet TAKE 1 TABLET BY MOUTH ONCE DAILY AS NEEDED FOR ERECTILE DISFUNCTION 01/16/17  Yes Waldon Merl, PA-C  indomethacin (INDOCIN) 25 MG capsule Take 1-2 capsules  (25-50 mg total) by mouth 3 (three) times daily as needed (gout attack). 08/12/16  Yes West, Emily, PA-C  lisinopril (PRINIVIL,ZESTRIL) 20 MG tablet TAKE ONE TABLET BY MOUTH ONCE DAILY 07/21/16  Yes Waldon Merl, PA-C  omeprazole (PRILOSEC) 20 MG capsule Take 1 capsule (20 mg total) by mouth 2 (two) times daily before a meal. 04/29/16  Yes Cheri Fowler, PA-C  colchicine 0.6 MG tablet Take 2 tablets by mouth. Take 3rd table 1 hour later. 08/08/16   Waldon Merl, PA-C  omeprazole (PRILOSEC) 20 MG capsule Take 1 capsule (20 mg total) by mouth 2 (two) times daily before a meal. 09/02/16   Cathren Laine, MD  oxyCODONE-acetaminophen (PERCOCET/ROXICET) 5-325 MG tablet Take 1 tablet by mouth every 4 (four) hours as needed for severe pain. 08/12/16   Trixie Dredge, PA-C  sildenafil (VIAGRA) 50 MG tablet Take 0.5 tablets (25 mg total) by mouth daily as needed for erectile dysfunction. 12/28/15   Waldon Merl, PA-C    Family History Family History  Problem Relation Age of Onset  . Hypertension Mother   . Diabetes Mother   . Hypertension Father     Social History Social History  Substance Use Topics  . Smoking status: Never Smoker  . Smokeless tobacco: Never Used  . Alcohol use Yes     Comment: occasionally     Allergies   Patient has no known allergies.   Review of Systems Review of Systems  Constitutional: Negative for chills  and fever.  HENT: Negative for sore throat, trouble swallowing and voice change.   Eyes: Negative for visual disturbance.  Respiratory: Negative for cough and shortness of breath.   Cardiovascular: Negative for palpitations and leg swelling.  Gastrointestinal: Negative for abdominal pain and vomiting.  Genitourinary: Negative for flank pain.  Musculoskeletal: Negative for back pain.  Skin: Negative for rash.  Neurological: Negative for weakness and headaches.  Hematological: Does not bruise/bleed easily.  Psychiatric/Behavioral: Negative for confusion.      Physical Exam Updated Vital Signs BP (!) 152/114 (BP Location: Right Arm)   Pulse 83   Temp 98.2 F (36.8 C) (Oral)   Resp 19   Ht 1.753 m ( )   Wt 90.7 kg (200 lb)   SpO2 100%   BMI 29.53 kg/m   Physical Exam  Constitutional: He is oriented to person, place, and time. He appears well-developed and well-nourished. No distress.  HENT:  Mouth/Throat: Oropharynx is clear and moist.  Eyes: Pupils are equal, round, and reactive to light. Conjunctivae and EOM are normal.  Neck: Neck supple. No JVD present. No tracheal deviation present. No thyromegaly present.  No bruits.   Cardiovascular: Normal rate, regular rhythm, normal heart sounds and intact distal pulses.  Exam reveals no gallop and no friction rub.   No murmur heard. Pulmonary/Chest: Effort normal and breath sounds normal. No accessory muscle usage or stridor. No respiratory distress. He exhibits no tenderness.  Abdominal: Soft. Bowel sounds are normal. He exhibits no distension. There is no tenderness.  Musculoskeletal: He exhibits no edema or tenderness.  Lymphadenopathy:    He has no cervical adenopathy.  Neurological: He is alert and oriented to person, place, and time. No cranial nerve deficit.  Speech normal. Motor intact bil, stre 5/5. Steady gait.   Skin: Skin is warm and dry. He is not diaphoretic.  Psychiatric: He has a normal mood and affect.  Nursing note and vitals reviewed.    ED Treatments / Results  Labs (all labs ordered are listed, but only abnormal results are displayed) Results for orders placed or performed during the hospital encounter of 01/24/17  Comprehensive metabolic panel  Result Value Ref Range   Sodium 135 135 - 145 mmol/L   Potassium 3.5 3.5 - 5.1 mmol/L   Chloride 102 101 - 111 mmol/L   CO2 25 22 - 32 mmol/L   Glucose, Bld 85 65 - 99 mg/dL   BUN 15 6 - 20 mg/dL   Creatinine, Ser 8.41 0.61 - 1.24 mg/dL   Calcium 9.1 8.9 - 66.0 mg/dL   Total Protein 7.5 6.5 - 8.1 g/dL    Albumin 4.3 3.5 - 5.0 g/dL   AST 18 15 - 41 U/L   ALT 17 17 - 63 U/L   Alkaline Phosphatase 68 38 - 126 U/L   Total Bilirubin 0.8 0.3 - 1.2 mg/dL   GFR calc non Af Amer >60 >60 mL/min   GFR calc Af Amer >60 >60 mL/min   Anion gap 8 5 - 15  CBC  Result Value Ref Range   WBC 4.9 4.0 - 10.5 K/uL   RBC 4.59 4.22 - 5.81 MIL/uL   Hemoglobin 14.3 13.0 - 17.0 g/dL   HCT 63.0 16.0 - 10.9 %   MCV 86.9 78.0 - 100.0 fL   MCH 31.2 26.0 - 34.0 pg   MCHC 35.8 30.0 - 36.0 g/dL   RDW 32.3 55.7 - 32.2 %   Platelets 237 150 - 400 K/uL  Troponin  I  Result Value Ref Range   Troponin I <0.03 <0.03 ng/mL   Dg Chest 2 View  Result Date: 01/24/2017 CLINICAL DATA:  41 year old male with history of left-sided neck cramping and numbness radiating into the face for the past several days. EXAM: CHEST  2 VIEW COMPARISON:  Chest x-ray 12/15/2014. FINDINGS: Lung volumes are normal. No consolidative airspace disease. No pleural effusions. No pneumothorax. No pulmonary nodule or mass noted. Pulmonary vasculature and the cardiomediastinal silhouette are within normal limits. IMPRESSION: No radiographic evidence of acute cardiopulmonary disease. Electronically Signed   By: Trudie Reed M.D.   On: 01/24/2017 20:29    EKG  EKG Interpretation  Date/Time:  Saturday January 24 2017 19:35:36 EDT Ventricular Rate:  83 PR Interval:    QRS Duration: 90 QT Interval:  368 QTC Calculation: 433 R Axis:   67 Text Interpretation:  Sinus rhythm Probable left atrial enlargement Left ventricular hypertrophy Non-specific ST-t changes Confirmed by Cathren Laine ([id number]) on 01/24/2017 7:52:31 PM       Radiology Dg Chest 2 View  Result Date: 01/24/2017 CLINICAL DATA:  41 year old male with history of left-sided neck cramping and numbness radiating into the face for the past several days. EXAM: CHEST  2 VIEW COMPARISON:  Chest x-ray 12/15/2014. FINDINGS: Lung volumes are normal. No consolidative airspace disease. No pleural  effusions. No pneumothorax. No pulmonary nodule or mass noted. Pulmonary vasculature and the cardiomediastinal silhouette are within normal limits. IMPRESSION: No radiographic evidence of acute cardiopulmonary disease. Electronically Signed   By: Trudie Reed M.D.   On: 01/24/2017 20:29    Procedures Procedures (including critical care time)  Medications Ordered in ED Medications - No data to display   Initial Impression / Assessment and Plan / ED Course  I have reviewed the triage vital signs and the nursing notes.  Pertinent labs & imaging results that were available during my care of the patient were reviewed by me and considered in my medical decision making (see chart for details).  Pts symptoms very atypical sounding.  Labs sent, ecg ordered.  Reviewed nursing notes and prior charts for additional history.   Pt/sig other - indicate patient has adequate bp meds at home, but doesn't take regularly.   Pt given dose of his bp meds  - lisinopril 20 mg po.   bp 148/100.  No chest pain or sob. No neuro findings on exam.   After symptoms for 2 days, trop neg - pts symptoms do not appear c/w acs.   Patient currently appears stable for d/c.  Pt indicates his pcp is in this building and that he has close f/u arranged.     Final Clinical Impressions(s) / ED Diagnoses   Final diagnoses:  None    New Prescriptions New Prescriptions   No medications on file     Cathren Laine, MD 01/24/17 2100

## 2017-01-24 NOTE — ED Notes (Signed)
Pt reports intermittent "crampy/numbness sensation on my left neck going up my face." Pt denies difficulty swallowing, denies blurry vision, denies difficulty ambulating, denies difficulty reading.

## 2017-01-24 NOTE — Discharge Instructions (Signed)
It was our pleasure to provide your ER care today - we hope that you feel better.  Your blood pressure is high tonight - take your blood pressure medication regularly as prescribed by your doctor. Limit salt intake. Follow up very closely with primary care doctor in the coming week.   Return to ER if worse, new symptoms, fevers, trouble breathing, recurrent or persistent chest pain, one-sided numbness/weakness, change in speech or vision, or other concern.

## 2017-01-28 ENCOUNTER — Telehealth: Payer: Self-pay | Admitting: Medical

## 2017-01-28 ENCOUNTER — Encounter: Payer: Self-pay | Admitting: Medical

## 2017-01-28 ENCOUNTER — Ambulatory Visit (INDEPENDENT_AMBULATORY_CARE_PROVIDER_SITE_OTHER): Payer: BLUE CROSS/BLUE SHIELD | Admitting: Medical

## 2017-01-28 VITALS — BP 170/100 | HR 70 | Temp 97.9°F | Ht 69.0 in | Wt 199.0 lb

## 2017-01-28 DIAGNOSIS — I10 Essential (primary) hypertension: Secondary | ICD-10-CM

## 2017-01-28 DIAGNOSIS — E010 Iodine-deficiency related diffuse (endemic) goiter: Secondary | ICD-10-CM | POA: Diagnosis not present

## 2017-01-28 DIAGNOSIS — Z8739 Personal history of other diseases of the musculoskeletal system and connective tissue: Secondary | ICD-10-CM

## 2017-01-28 LAB — T4: T4, Total: 8.2 ug/dL (ref 4.9–10.5)

## 2017-01-28 LAB — TSH: TSH: 0.39 u[IU]/mL (ref 0.35–4.50)

## 2017-01-28 MED ORDER — HYDROCHLOROTHIAZIDE 12.5 MG PO TABS: 12.5000 mg | ORAL_TABLET | Freq: Every day | ORAL | 0 refills | Status: DC

## 2017-01-28 MED ORDER — AMLODIPINE BESYLATE 10 MG PO TABS: 10.0000 mg | ORAL_TABLET | Freq: Every day | ORAL | 0 refills | Status: DC

## 2017-01-28 MED FILL — AMLODIPINE BESYLATE 10 MG T: 10 | 30 days supply | Qty: 30 | Fill #0

## 2017-01-28 NOTE — Progress Notes (Signed)
Subjective:    Patient ID: John Simmons, male    DOB: [birth year], 41 y.o.   MRN: [id number]  HPI  Pt in for follow up.  Pt states he just got in big argument with fiancee. She got angry at him for taking for taking 20 mg dose but doubling up on dose. He did this yesterday and today. Appeared on discussion that she might have wanted him to contact PCP rather than self-medicating.  Pt states 3 months ago his blood pressure was 119/100 range.   Pt in past was on diuretic component but not on presently.   In past some gout flares but in past his flares would occur with drinking. He specifies the me that is not really convinced that his former day really cause any flares.  Pt was seen by the ED. By ED no review it appears he was advised continuing lisinopril 20 mg a day and told to follow-up with PCP.  Pt checked his bp at work with nurse and they advised him to be seen today since his blood pressure was 140/100.  Pt states going through divorce and real stressful.  No recent decongestants. No coffee.  Saturday felt mid fatigue and dizziness but not now.  At work before he came here bp 142/100. Then afterwards he talked to his girlfriend. Mild argument and bp increased. During the argument he stated he did feel his blood pressure increased. But denied any symptoms such as headache, nausea, vomiting, dizziness or any gross motor/sensory function deficits. No chest pain.  Pt does mentions years ago when he lost weight to 175 he had to stop bp meds since bp dropped too low. So he desires to loose weight.   Review of Systems  Constitutional: Negative for chills and fatigue.  Eyes: Negative for pain and visual disturbance.  Respiratory: Negative for cough, chest tightness, shortness of breath and wheezing.   Cardiovascular: Negative for chest pain and palpitations.  Gastrointestinal: Negative for abdominal distention, anal bleeding, constipation, diarrhea, rectal pain and vomiting.    Genitourinary: Negative for decreased urine volume, difficulty urinating, enuresis and testicular pain.  Musculoskeletal: Negative for back pain and neck pain.  Skin: Negative for rash.  Neurological: Negative for seizures, syncope, facial asymmetry, weakness and light-headedness.       When girlfriend was upset at him he felt like his bp was increasing earlier.  Hematological: Negative for adenopathy. Does not bruise/bleed easily.  Psychiatric/Behavioral: Negative for behavioral problems, confusion, dysphoric mood and suicidal ideas. The patient is not nervous/anxious.     Past Medical History:  Diagnosis Date  . Chronic neck pain   . GERD (gastroesophageal reflux disease)   . Gout   . Hypertension      Social History   Social History  . Marital status: Married    Spouse name: N/A  . Number of children: N/A  . Years of education: N/A   Occupational History  . Not on file.   Social History Main Topics  . Smoking status: Never Smoker  . Smokeless tobacco: Never Used  . Alcohol use Yes     Comment: occasionally  . Drug use: No  . Sexual activity: Not on file   Other Topics Concern  . Not on file   Social History Narrative  . No narrative on file    Past Surgical History:  Procedure Laterality Date  . HEMORRHOID BANDING  2016  . negative      Family History  Problem Relation Age  of Onset  . Hypertension Mother   . Diabetes Mother   . Hypertension Father     No Known Allergies  Current Outpatient Prescriptions on File Prior to Visit  Medication Sig Dispense Refill  . atorvastatin (LIPITOR) 10 MG tablet TAKE ONE TABLET BY MOUTH ONCE DAILY 30 tablet 1  . CIALIS 20 MG tablet TAKE 1 TABLET BY MOUTH ONCE DAILY AS NEEDED FOR ERECTILE DISFUNCTION 1 tablet 3  . colchicine 0.6 MG tablet Take 2 tablets by mouth. Take 3rd table 1 hour later. 3 tablet 1  . indomethacin (INDOCIN) 25 MG capsule Take 1-2 capsules (25-50 mg total) by mouth 3 (three) times daily as needed  (gout attack). 20 capsule 0  . lisinopril (PRINIVIL,ZESTRIL) 20 MG tablet TAKE ONE TABLET BY MOUTH ONCE DAILY 90 tablet 0  . omeprazole (PRILOSEC) 20 MG capsule Take 1 capsule (20 mg total) by mouth 2 (two) times daily before a meal. 30 capsule 0  . omeprazole (PRILOSEC) 20 MG capsule Take 1 capsule (20 mg total) by mouth 2 (two) times daily before a meal. 60 capsule 0  . oxyCODONE-acetaminophen (PERCOCET/ROXICET) 5-325 MG tablet Take 1 tablet by mouth every 4 (four) hours as needed for severe pain. 8 tablet 0  . sildenafil (VIAGRA) 50 MG tablet Take 0.5 tablets (25 mg total) by mouth daily as needed for erectile dysfunction. 10 tablet 0   No current facility-administered medications on file prior to visit.     BP (!) 160/112   Pulse 70   Temp 97.9 F (36.6 C) (Oral)   Ht  (1.753 m)   Wt 199 lb (90.3 kg)   SpO2 99%   BMI 29.39 kg/m       Objective:   Physical Exam  General Mental Status- Alert. General Appearance- Not in acute distress.   Skin General: Color- Normal Color. Moisture- Normal Moisture.  Neck Carotid Arteries- Normal color. Moisture- Normal Moisture. No carotid bruits. No JVD.  Chest and Lung Exam Auscultation: Breath Sounds:-Normal.  Cardiovascular Auscultation:Rythm- Regular. Murmurs & Other Heart Sounds:Auscultation of the heart reveals- No Murmurs.  Abdomen Inspection:-Inspeection Normal. Palpation/Percussion:Note:No mass. Palpation and Percussion of the abdomen reveal- Non Tender, Non Distended + BS, no rebound or guarding.  Neurologic Cranial Nerve exam:- CN III-XII intact(No nystagmus), symmetric smile. Drift Test:- No drift. Romberg Exam:- Negative.  Heal to Toe Gait exam:-Normal. Finger to Nose:- Normal/Intact Strength:- 5/5 equal and symmetric strength both upper and lower extremities.     Assessment & Plan:  Your blood pressure is moderate-to severe high today. Some of your elevation compared to this morning's reading might be  stress related. This morning when work  nurse checked blood pressure was 142/100 but when I checked it was 170/100. You have very good neurologic exam presently with absence of neurologic signs and symptoms.  However we need to be cautious and give you another blood pressure medication. I want you to take amlodipine 10 mg 1 tablet today when you get the prescription filled downstairs. Currently I want you to continue the lisinopril 40 mg daily. When your blood pressure is in  better range/closer to 140/90 at that point likely would advise stopping lisinopril and switching to losartan. Losartan typically has less potential side effects.   Also I'm going to give you a print prescription of HCTZ 12.5 mg. I don't want you to start this presently but the benefit of this might outweigh risk. You do have a history of gout and I want you to ask  your work/nurse that she could tell you what your recent uric acid level was.(continue to not drink alchohol)  I want you to take a day off tomorrow and try to relax. Work note written.   Follow-up this Friday with your PCP(or with me if PCP not available. ) Or as needed.   In light of recent high blood pressures would advise that you be seen in the emergency department again if you develop any neurologic signs or symptoms as discussed.   TSH and T4 ordered. Want to check these since I think he might have some mild thyroid enlargement found on exam. You may benefit from a thyroid ultrasound in the near future as well.   Diante Barley, Ramon Dredge, PA-C

## 2017-01-28 NOTE — Telephone Encounter (Signed)
FYI

## 2017-01-28 NOTE — Telephone Encounter (Signed)
[facility] [place] [facility] - Cli TELEPHONE ADVICE RECORD [facility] Patient Name: John Simmons DOB: February 21, 1976 Initial Comment Went to ER on Saturday for high BP, and was told when he left the hospital to take 2 bp pills instead of 1 and its still 140/110 now Nurse Assessment Nurse: Lane Hacker, RN, Elvin So Date/Time John Simmons Time): 01/28/2017 12:17:28 PM Confirm and document reason for call. If symptomatic, describe symptoms. ---Caller states that he went to ER on Saturday for HTN. He was told when he left the hospital to take 2 tabs of Lisinopril 20 mg instead of 1 tab and BP still 142/100 now. He is drinking lots of water, but afraid to eat in case too much NA. Does the patient have any new or worsening symptoms? ---Yes Will a triage be completed? ---Yes Related visit to physician within the last 2 weeks? ---Yes Does the PT have any chronic conditions? (i.e. diabetes, asthma, etc.) ---Yes List chronic conditions. ---HTN Is this a behavioral health or substance abuse call? ---No Guidelines Guideline Title Affirmed Question Affirmed Notes High Blood Pressure Systolic BP >= 160 OR Diastolic >= 100 Final Disposition User See PCP When Office is Open (within 3 days) Lane Hacker, Charity fundraiser, [place] Comments Pt requesting to be seen today. Appt made with PA Esperanza Richters at 1 pm. Referrals REFERRED TO PCP OFFICE Caller Disagree/Comply Comply Caller Understands Yes PreDisposition Did not know what to do

## 2017-01-28 NOTE — Patient Instructions (Addendum)
Your blood pressure is moderate-to severe high today. Some of your elevation compared to this morning's reading might be stress related. This morning when work  nurse checked blood pressure was 142/100 but when I checked it was 170/100. You have very good neurologic exam presently with absence of neurologic signs and symptoms.  However we need to be cautious and give you another blood pressure medication. I want you to take amlodipine 10 mg 1 tablet today when you get the prescription filled downstairs. Currently I want you to continue the lisinopril 40 mg daily. When your blood pressure is in  better range/closer to 140/90 at that point likely would advise stopping lisinopril and switching to losartan. Losartan typically has less potential side effects.   Also I'm going to give you a print prescription of HCTZ 12.5 mg. I don't want you to start this presently but the benefit of this might outweigh risk. You do have a history of gout and I want you to ask your work/nurse that she could tell you what your recent uric acid level was.(continue to not drink alchohol)  I want you to take a day off tomorrow and try to relax. Work note written.   Follow-up this Friday with your PCP(or with me if PCP not available. ) Or as needed.   In light of recent high blood pressures would advise that you be seen in the emergency department again if you develop any neurologic signs or symptoms as discussed.   TSH and T4 ordered. Want to check these since I think he might have some mild thyroid enlargement found on exam. You may benefit from a thyroid ultrasound in the near future as well.

## 2017-01-29 ENCOUNTER — Ambulatory Visit: Payer: BLUE CROSS/BLUE SHIELD | Admitting: Physician Assistant

## 2017-01-30 ENCOUNTER — Ambulatory Visit: Payer: BLUE CROSS/BLUE SHIELD | Admitting: Medical

## 2017-02-02 ENCOUNTER — Telehealth: Payer: Self-pay | Admitting: Medical

## 2017-02-02 ENCOUNTER — Other Ambulatory Visit: Payer: Self-pay

## 2017-02-02 DIAGNOSIS — I10 Essential (primary) hypertension: Secondary | ICD-10-CM

## 2017-02-02 MED ORDER — LISINOPRIL 40 MG PO TABS: 40.0000 mg | ORAL_TABLET | Freq: Every day | ORAL | 0 refills | Status: DC

## 2017-02-02 MED ORDER — LISINOPRIL 20 MG PO TABS: 20.0000 mg | ORAL_TABLET | Freq: Every day | ORAL | 0 refills | Status: DC

## 2017-02-02 NOTE — Telephone Encounter (Signed)
LISINOPRIL call to [facility] on [address]. He wants to pick up today so he will have a dose for in the morning. Pt [phone].  PT gets off work at 5:30.

## 2017-02-02 NOTE — Telephone Encounter (Signed)
Please try to call patient tomorrow and have him scheduled for blood pressure checks this week.  I had added amlodipine prescription to his regimen. He had doubled up on his lisinopril 20 mg before he saw me.  Based on how his blood pressure was doing I was considering having him take HCTZ.  Patient never followed up but he did call requesting refill of his lisinopril. He did not specify whether or not he was taking it twice a day or just once a day.  I did send him at prescription for 30 tablets of the 20 mg. The official sig was 1 tablet a day.  I called him and notified him that I was sending the prescription over but also advise to have a follow-up appointment this week.  He was advised on the last office visit to schedule follow-up but he did not keep that appointment.

## 2017-02-02 NOTE — Telephone Encounter (Signed)
I did call patient and his blood pressures have been around 126/90-100. He is going to follow-up this Thursday. He states he does feel better.  I clarify with him and he has been on the lisinopril 40 mg dose. I refilled lisinopril at that dose. In the near future I'm considering switching him to losartan. Also depending on how his blood pressure is might add the low-dose diuretic.

## 2017-02-02 NOTE — Telephone Encounter (Signed)
Medication refilled and sent to [facility]

## 2017-02-04 NOTE — Telephone Encounter (Signed)
Left pt a message to call ans schedule BP check

## 2017-02-05 ENCOUNTER — Encounter: Payer: Self-pay | Admitting: Medical

## 2017-02-05 ENCOUNTER — Ambulatory Visit (INDEPENDENT_AMBULATORY_CARE_PROVIDER_SITE_OTHER): Payer: BLUE CROSS/BLUE SHIELD | Admitting: Medical

## 2017-02-05 VITALS — BP 136/90 | HR 78 | Temp 97.7°F | Resp 16 | Ht 69.0 in | Wt 200.2 lb

## 2017-02-05 DIAGNOSIS — I10 Essential (primary) hypertension: Secondary | ICD-10-CM | POA: Diagnosis not present

## 2017-02-05 MED ORDER — AMLODIPINE BESYLATE 10 MG PO TABS: 10.0000 mg | ORAL_TABLET | Freq: Every day | ORAL | 0 refills | Status: DC

## 2017-02-05 NOTE — Progress Notes (Signed)
Subjective:    Patient ID: John Simmons, male    DOB: 12/27/1975, 41 y.o.   MRN: [id number]  HPI  Pt in for follow up.  His bp yesterday 117/87. Other days 126/88 and 118/80. No chest pain or neurologic signs or symptoms. Pt states checking daily and all readings are about the same. Early on day after I saw him bp was 146/96. But last 4 days all less than 140/90.  Pt is on lisinopril 40 mg a day. He is also know on amlodipine.  Pt today remembers also today around time his bp increased drank alcohol beverage with some sort of energy drink combination(cousin gave this to him when they were celebrating birth of his nephew). Then he thinks his bp increased.(he usually avoids caffeine)    Review of Systems  Constitutional: Negative for chills, fatigue and fever.  Respiratory: Negative for cough, choking, shortness of breath and wheezing.   Cardiovascular: Negative for chest pain and palpitations.  Gastrointestinal: Negative for abdominal pain.  Musculoskeletal: Negative for back pain.  Skin: Negative for rash.  Neurological: Negative for dizziness, speech difficulty, weakness and headaches.  Hematological: Negative for adenopathy. Does not bruise/bleed easily.  Psychiatric/Behavioral: Negative for behavioral problems, confusion and sleep disturbance. The patient is not nervous/anxious.     Past Medical History:  Diagnosis Date  . Chronic neck pain   . GERD (gastroesophageal reflux disease)   . Gout   . Hypertension      Social History   Social History  . Marital status: Married    Spouse name: N/A  . Number of children: N/A  . Years of education: N/A   Occupational History  . Not on file.   Social History Main Topics  . Smoking status: Never Smoker  . Smokeless tobacco: Never Used  . Alcohol use Yes     Comment: occasionally  . Drug use: No  . Sexual activity: Not on file   Other Topics Concern  . Not on file   Social History Narrative  . No narrative on  file    Past Surgical History:  Procedure Laterality Date  . HEMORRHOID BANDING  2016  . negative      Family History  Problem Relation Age of Onset  . Hypertension Mother   . Diabetes Mother   . Hypertension Father     No Known Allergies  Current Outpatient Prescriptions on File Prior to Visit  Medication Sig Dispense Refill  . allopurinol (ZYLOPRIM) 100 MG tablet   0  . amLODipine (NORVASC) 10 MG tablet Take 1 tablet (10 mg total) by mouth daily. 30 tablet 0  . atorvastatin (LIPITOR) 10 MG tablet TAKE ONE TABLET BY MOUTH ONCE DAILY 30 tablet 1  . CIALIS 20 MG tablet TAKE 1 TABLET BY MOUTH ONCE DAILY AS NEEDED FOR ERECTILE DISFUNCTION 1 tablet 3  . colchicine 0.6 MG tablet Take 2 tablets by mouth. Take 3rd table 1 hour later. 3 tablet 1  . hydrochlorothiazide (HYDRODIURIL) 12.5 MG tablet Take 1 tablet (12.5 mg total) by mouth daily. 30 tablet 0  . indomethacin (INDOCIN) 25 MG capsule Take 1-2 capsules (25-50 mg total) by mouth 3 (three) times daily as needed (gout attack). 20 capsule 0  . lisinopril (PRINIVIL,ZESTRIL) 40 MG tablet Take 1 tablet (40 mg total) by mouth daily. 30 tablet 0  . omeprazole (PRILOSEC) 20 MG capsule Take 1 capsule (20 mg total) by mouth 2 (two) times daily before a meal. 30 capsule 0  .  omeprazole (PRILOSEC) 20 MG capsule Take 1 capsule (20 mg total) by mouth 2 (two) times daily before a meal. 60 capsule 0  . oxyCODONE-acetaminophen (PERCOCET/ROXICET) 5-325 MG tablet Take 1 tablet by mouth every 4 (four) hours as needed for severe pain. 8 tablet 0  . sildenafil (VIAGRA) 50 MG tablet Take 0.5 tablets (25 mg total) by mouth daily as needed for erectile dysfunction. 10 tablet 0   No current facility-administered medications on file prior to visit.     BP 136/90   Pulse 78   Temp 97.7 F (36.5 C) (Oral)   Resp 16   Ht 5\' 9"  (1.753 m)   Wt 200 lb 3.2 oz (90.8 kg)   SpO2 100%   BMI 29.56 kg/m       Objective:   Physical Exam  General Mental  Status- Alert. General Appearance- Not in acute distress.   Skin General: Color- Normal Color. Moisture- Normal Moisture.  Neck Carotid Arteries- Normal color. Moisture- Normal Moisture. No carotid bruits. No JVD.  Chest and Lung Exam Auscultation: Breath Sounds:-Normal.  Cardiovascular Auscultation:Rythm- Regular. Murmurs & Other Heart Sounds:Auscultation of the heart reveals- No Murmurs.  Abdomen Inspection:-Inspeection Normal. Palpation/Percussion:Note:No mass. Palpation and Percussion of the abdomen reveal- Non Tender, Non Distended + BS, no rebound or guarding.   Neurologic Cranial Nerve exam:- CN III-XII intact(No nystagmus), symmetric smile. Strength:- 5/5 equal and symmetric strength both upper and lower extremities.      Assessment & Plan:  Your blood pressure is very well controlled over the last 4 days by your most recent readings. It is a little high today but this might be due to it being in our office. I want you to continue same dose of lisinopril and amlodipine. I am writing you a prescription for over-the-counter blood pressure cuff. This might  be covered by insurance. I want you to check your blood pressure at home every other day. Would like it to be less than 140/90.   Follow-up for complete physical with your PCP as you report appointment in one month. Have your PCP review blood pressure readings. Follow-up here as needed.  Would defer further changes to patient's medication to PCP. Might possibly consider switching  Ace to ARB.

## 2017-02-05 NOTE — Patient Instructions (Addendum)
Your blood pressure is very well controlled over the last 4 days by your most recent readings. It is a little high today but this might be due to it being in our office. I want you to continue same dose of lisinopril and amlodipine. I am writing you a prescription for over-the-counter blood pressure cuff. This might  be covered by insurance. I want you to check your blood pressure at home every other day. Would like it to be less than 140/90.   Follow-up for complete physical with your PCP as you report appointment in one month. Have your PCP review blood pressure readings. Follow-up here as needed

## 2017-02-24 ENCOUNTER — Encounter: Payer: Self-pay | Admitting: Physician Assistant

## 2017-02-24 ENCOUNTER — Ambulatory Visit (INDEPENDENT_AMBULATORY_CARE_PROVIDER_SITE_OTHER): Payer: BLUE CROSS/BLUE SHIELD | Admitting: Physician Assistant

## 2017-02-24 VITALS — BP 116/85 | HR 74 | Temp 98.5°F | Resp 14 | Ht 69.0 in | Wt 198.0 lb

## 2017-02-24 DIAGNOSIS — Z0000 Encounter for general adult medical examination without abnormal findings: Secondary | ICD-10-CM | POA: Diagnosis not present

## 2017-02-24 DIAGNOSIS — Z23 Encounter for immunization: Secondary | ICD-10-CM

## 2017-02-24 DIAGNOSIS — E785 Hyperlipidemia, unspecified: Secondary | ICD-10-CM | POA: Diagnosis not present

## 2017-02-24 DIAGNOSIS — I10 Essential (primary) hypertension: Secondary | ICD-10-CM

## 2017-02-24 DIAGNOSIS — Z125 Encounter for screening for malignant neoplasm of prostate: Secondary | ICD-10-CM | POA: Diagnosis not present

## 2017-02-24 LAB — CBC WITH DIFFERENTIAL/PLATELET
BASOS ABS: 0 10*3/uL (ref 0.0–0.1)
BASOS PCT: 0.2 % (ref 0.0–3.0)
EOS ABS: 0.1 10*3/uL (ref 0.0–0.7)
Eosinophils Relative: 2.1 % (ref 0.0–5.0)
HCT: 43.1 % (ref 39.0–52.0)
HEMOGLOBIN: 14.8 g/dL (ref 13.0–17.0)
Lymphocytes Relative: 44.8 % (ref 12.0–46.0)
Lymphs Abs: 1.6 10*3/uL (ref 0.7–4.0)
MCHC: 34.4 g/dL (ref 30.0–36.0)
MCV: 91.7 fl (ref 78.0–100.0)
MONO ABS: 0.3 10*3/uL (ref 0.1–1.0)
Monocytes Relative: 7.9 % (ref 3.0–12.0)
NEUTROS PCT: 45 % (ref 43.0–77.0)
Neutro Abs: 1.6 10*3/uL (ref 1.4–7.7)
PLATELETS: 255 10*3/uL (ref 150.0–400.0)
RBC: 4.71 Mil/uL (ref 4.22–5.81)
RDW: 13.2 % (ref 11.5–15.5)
WBC: 3.5 10*3/uL — AB (ref 4.0–10.5)

## 2017-02-24 LAB — COMPREHENSIVE METABOLIC PANEL
ALT: 14 U/L (ref 0–53)
AST: 12 U/L (ref 0–37)
Albumin: 4.4 g/dL (ref 3.5–5.2)
Alkaline Phosphatase: 70 U/L (ref 39–117)
BILIRUBIN TOTAL: 0.4 mg/dL (ref 0.2–1.2)
BUN: 11 mg/dL (ref 6–23)
CALCIUM: 10.2 mg/dL (ref 8.4–10.5)
CHLORIDE: 102 meq/L (ref 96–112)
CO2: 32 meq/L (ref 19–32)
Creatinine, Ser: 1.22 mg/dL (ref 0.40–1.50)
GFR: 83.22 mL/min (ref >60.00)
GLUCOSE: 93 mg/dL (ref 70–99)
Potassium: 4.4 mEq/L (ref 3.5–5.1)
Sodium: 139 mEq/L (ref 135–145)
Total Protein: 7.4 g/dL (ref 6.0–8.3)

## 2017-02-24 LAB — LIPID PANEL
CHOL/HDL RATIO: 3
Cholesterol: 144 mg/dL (ref 0–200)
HDL: 52.9 mg/dL (ref >39.00)
LDL CALC: 78 mg/dL (ref 0–99)
NONHDL: 90.93
TRIGLYCERIDES: 66 mg/dL (ref 0.0–149.0)
VLDL: 13.2 mg/dL (ref 0.0–40.0)

## 2017-02-24 LAB — PSA: PSA: 0.36 ng/mL (ref 0.10–4.00)

## 2017-02-24 NOTE — Patient Instructions (Signed)
Please go to the lab for blood work.   Our office will call you with your results unless you have chosen to receive results via MyChart.  If your blood work is normal we will follow-up each year for physicals and as scheduled for chronic medical problems.  If anything is abnormal we will treat accordingly and get you in for a follow-up.  Continue medications as directed. We may alter regimen based on results.   Preventive Care 43-64 Years, Male Preventive care refers to lifestyle choices and visits with your health care provider that can promote health and wellness. What does preventive care include?  A yearly physical exam. This is also called an annual well check.  Dental exams once or twice a year.  Routine eye exams. Ask your health care provider how often you should have your eyes checked.  Personal lifestyle choices, including: ? Daily care of your teeth and gums. ? Regular physical activity. ? Eating a healthy diet. ? Avoiding tobacco and drug use. ? Limiting alcohol use. ? Practicing safe sex. ? Taking low-dose aspirin every day starting at age 14. What happens during an annual well check? The services and screenings done by your health care provider during your annual well check will depend on your age, overall health, lifestyle risk factors, and family history of disease. Counseling Your health care provider may ask you questions about your:  Alcohol use.  Tobacco use.  Drug use.  Emotional well-being.  Home and relationship well-being.  Sexual activity.  Eating habits.  Work and work Statistician.  Screening You may have the following tests or measurements:  Height, weight, and BMI.  Blood pressure.  Lipid and cholesterol levels. These may be checked every 5 years, or more frequently if you are over 6 years old.  Skin check.  Lung cancer screening. You may have this screening every year starting at age 43 if you have a 30-pack-year history of  smoking and currently smoke or have quit within the past 15 years.  Fecal occult blood test (FOBT) of the stool. You may have this test every year starting at age 43.  Flexible sigmoidoscopy or colonoscopy. You may have a sigmoidoscopy every 5 years or a colonoscopy every 10 years starting at age 43.  Prostate cancer screening. Recommendations will vary depending on your family history and other risks.  Hepatitis C blood test.  Hepatitis B blood test.  Sexually transmitted disease (STD) testing.  Diabetes screening. This is done by checking your blood sugar (glucose) after you have not eaten for a while (fasting). You may have this done every 1-3 years.  Discuss your test results, treatment options, and if necessary, the need for more tests with your health care provider. Vaccines Your health care provider may recommend certain vaccines, such as:  Influenza vaccine. This is recommended every year.  Tetanus, diphtheria, and acellular pertussis (Tdap, Td) vaccine. You may need a Td booster every 10 years.  Varicella vaccine. You may need this if you have not been vaccinated.  Zoster vaccine. You may need this after age 43.  Measles, mumps, and rubella (MMR) vaccine. You may need at least one dose of MMR if you were born in 1957 or later. You may also need a second dose.  Pneumococcal 13-valent conjugate (PCV13) vaccine. You may need this if you have certain conditions and have not been vaccinated.  Pneumococcal polysaccharide (PPSV23) vaccine. You may need one or two doses if you smoke cigarettes or if you have certain  conditions.  Meningococcal vaccine. You may need this if you have certain conditions.  Hepatitis A vaccine. You may need this if you have certain conditions or if you travel or work in places where you may be exposed to hepatitis A.  Hepatitis B vaccine. You may need this if you have certain conditions or if you travel or work in places where you may be exposed to  hepatitis B.  Haemophilus influenzae type b (Hib) vaccine. You may need this if you have certain risk factors.  Talk to your health care provider about which screenings and vaccines you need and how often you need them. This information is not intended to replace advice given to you by your health care provider. Make sure you discuss any questions you have with your health care provider. Document Released: 05/04/2015 Document Revised: 12/26/2015 Document Reviewed: 02/06/2015 Elsevier Interactive Patient Education  2017 [employer].

## 2017-02-24 NOTE — Progress Notes (Signed)
Patient presents to clinic today for annual exam.  Patient is fasting for labs.  Diet -- Has increased intake of fruit and vegetables. Eating lean proteins only. Staying hydrated.  Exercise -- Is doing cardio for at least 30 minutes per day and resistance training 15-30 minutes per day. Body mass index is 29.24 kg/m.  Chronic Issues: Hypertension -- Is currently on a regimen of amlodipine 10 mg and lisinopril 40 mg daily. . Patient denies chest pain, palpitations, lightheadedness, dizziness, vision changes or frequent headaches. Is taking Lipitor as directed.  BP Readings from Last 3 Encounters:  02/24/17 116/85  02/05/17 136/90  01/28/17 (!) 170/100   GERD -- Is currently on Omeprazole 20 mg. Doing very well on this regimen.  Health Maintenance: Immunizations -- up-to-date.   Past Medical History:  Diagnosis Date  . Chronic neck pain   . GERD (gastroesophageal reflux disease)   . Gout   . Hypertension     Past Surgical History:  Procedure Laterality Date  . HEMORRHOID BANDING  2016  . negative      Current Outpatient Medications on File Prior to Visit  Medication Sig Dispense Refill  . allopurinol (ZYLOPRIM) 100 MG tablet   0  . amLODipine (NORVASC) 10 MG tablet Take 1 tablet (10 mg total) by mouth daily. 30 tablet 0  . atorvastatin (LIPITOR) 10 MG tablet TAKE ONE TABLET BY MOUTH ONCE DAILY 30 tablet 1  . CIALIS 20 MG tablet TAKE 1 TABLET BY MOUTH ONCE DAILY AS NEEDED FOR ERECTILE DISFUNCTION 1 tablet 3  . colchicine 0.6 MG tablet Take 2 tablets by mouth. Take 3rd table 1 hour later. 3 tablet 1  . lisinopril (PRINIVIL,ZESTRIL) 40 MG tablet Take 1 tablet (40 mg total) by mouth daily. 30 tablet 0  . omeprazole (PRILOSEC) 20 MG capsule Take 1 capsule (20 mg total) by mouth 2 (two) times daily before a meal. 30 capsule 0   No current facility-administered medications on file prior to visit.     No Known Allergies  Family History  Problem Relation Age of Onset  .  Hypertension Mother   . Diabetes Mother   . Hypertension Father     Social History   Socioeconomic History  . Marital status: Married    Spouse name: Not on file  . Number of children: Not on file  . Years of education: Not on file  . Highest education level: Not on file  Social Needs  . Financial resource strain: Not on file  . Food insecurity - worry: Not on file  . Food insecurity - inability: Not on file  . Transportation needs - medical: Not on file  . Transportation needs - non-medical: Not on file  Occupational History  . Not on file  Tobacco Use  . Smoking status: Never Smoker  . Smokeless tobacco: Never Used  Substance and Sexual Activity  . Alcohol use: Yes    Comment: occasionally  . Drug use: No  . Sexual activity: Not on file  Other Topics Concern  . Not on file  Social History Narrative  . Not on file   Review of Systems  Constitutional: Negative for fever and weight loss.  HENT: Negative for ear discharge, ear pain, hearing loss and tinnitus.   Eyes: Negative for blurred vision, double vision, photophobia and pain.  Respiratory: Negative for cough and shortness of breath.   Cardiovascular: Negative for chest pain and palpitations.  Gastrointestinal: Negative for abdominal pain, blood in stool, constipation,  diarrhea, heartburn, melena, nausea and vomiting.  Genitourinary: Negative for dysuria, flank pain, frequency, hematuria and urgency.  Musculoskeletal: Negative for falls.  Neurological: Negative for dizziness, loss of consciousness and headaches.  Endo/Heme/Allergies: Negative for environmental allergies.  Psychiatric/Behavioral: Negative for depression, hallucinations, substance abuse and suicidal ideas. The patient is not nervous/anxious and does not have insomnia.    BP 116/85   Pulse 74   Temp 98.5 F (36.9 C) (Oral)   Resp 14   Ht 5' 9" (1.753 m)   Wt 198 lb (89.8 kg)   SpO2 98%   BMI 29.24 kg/m   Physical Exam  Constitutional: He is  oriented to person, place, and time and well-developed, well-nourished, and in no distress.  HENT:  Head: Normocephalic and atraumatic.  Right Ear: External ear normal.  Left Ear: External ear normal.  Nose: Nose normal.  Mouth/Throat: Oropharynx is clear and moist. No oropharyngeal exudate.  Eyes: Conjunctivae and EOM are normal. Pupils are equal, round, and reactive to light.  Neck: Neck supple. No thyromegaly present.  Cardiovascular: Normal rate, regular rhythm, normal heart sounds and intact distal pulses.  Pulmonary/Chest: Effort normal and breath sounds normal. No respiratory distress. He has no wheezes. He has no rales. He exhibits no tenderness.  Abdominal: Soft. Bowel sounds are normal. He exhibits no distension and no mass. There is no tenderness. There is no rebound and no guarding.  Genitourinary: Testes/scrotum normal.  Lymphadenopathy:    He has no cervical adenopathy.  Neurological: He is alert and oriented to person, place, and time.  Skin: Skin is warm and dry. No rash noted.  Psychiatric: Affect normal.  Vitals reviewed.   Recent Results (from the past 2160 hour(s))  Comprehensive metabolic panel     Status: None   Collection Time: 01/24/17  7:39 PM  Result Value Ref Range   Sodium 135 135 - 145 mmol/L   Potassium 3.5 3.5 - 5.1 mmol/L   Chloride 102 101 - 111 mmol/L   CO2 25 22 - 32 mmol/L   Glucose, Bld 85 65 - 99 mg/dL   BUN 15 6 - 20 mg/dL   Creatinine, Ser 1.17 0.61 - 1.24 mg/dL   Calcium 9.1 8.9 - 10.3 mg/dL   Total Protein 7.5 6.5 - 8.1 g/dL   Albumin 4.3 3.5 - 5.0 g/dL   AST 18 15 - 41 U/L   ALT 17 17 - 63 U/L   Alkaline Phosphatase 68 38 - 126 U/L   Total Bilirubin 0.8 0.3 - 1.2 mg/dL   GFR calc non Af Amer >60 >60 mL/min   GFR calc Af Amer >60 >60 mL/min    Comment: (NOTE) The eGFR has been calculated using the CKD EPI equation. This calculation has not been validated in all clinical situations. eGFR's persistently <60 mL/min signify possible  Chronic Kidney Disease.    Anion gap 8 5 - 15  CBC     Status: None   Collection Time: 01/24/17  7:39 PM  Result Value Ref Range   WBC 4.9 4.0 - 10.5 K/uL   RBC 4.59 4.22 - 5.81 MIL/uL   Hemoglobin 14.3 13.0 - 17.0 g/dL   HCT 39.9 39.0 - 52.0 %   MCV 86.9 78.0 - 100.0 fL   MCH 31.2 26.0 - 34.0 pg   MCHC 35.8 30.0 - 36.0 g/dL   RDW 12.2 11.5 - 15.5 %   Platelets 237 150 - 400 K/uL  Troponin I     Status: None     Collection Time: 01/24/17  7:39 PM  Result Value Ref Range   Troponin I <0.03 <0.03 ng/mL  TSH     Status: None   Collection Time: 01/28/17  2:25 PM  Result Value Ref Range   TSH 0.39 0.35 - 4.50 uIU/mL  T4     Status: None   Collection Time: 01/28/17  2:25 PM  Result Value Ref Range   T4, Total 8.2 4.9 - 10.5 mcg/dL    Assessment/Plan: No problem-specific Assessment & Plan notes found for this encounter.    ,  Cody, PA-C  

## 2017-02-24 NOTE — Progress Notes (Signed)
Pre visit review using our clinic review tool, if applicable. No additional management support is needed unless otherwise documented below in the visit note. 

## 2017-03-06 DIAGNOSIS — Z136 Encounter for screening for cardiovascular disorders: Secondary | ICD-10-CM | POA: Diagnosis not present

## 2017-03-11 DIAGNOSIS — E782 Mixed hyperlipidemia: Secondary | ICD-10-CM | POA: Diagnosis not present

## 2017-03-11 DIAGNOSIS — I10 Essential (primary) hypertension: Secondary | ICD-10-CM | POA: Diagnosis not present

## 2017-03-11 DIAGNOSIS — K219 Gastro-esophageal reflux disease without esophagitis: Secondary | ICD-10-CM | POA: Diagnosis not present

## 2017-03-11 DIAGNOSIS — M1A9XX0 Chronic gout, unspecified, without tophus (tophi): Secondary | ICD-10-CM | POA: Diagnosis not present

## 2017-04-06 ENCOUNTER — Telehealth: Payer: Self-pay | Admitting: Physician Assistant

## 2017-04-06 NOTE — Telephone Encounter (Signed)
Copied from CRM [phone]. Topic: Quick Communication - Rx Refill/Question >> Apr 06, 2017 12:48 PM Jolayne Hainesaylor, Brittany L wrote: Has the patient contacted their pharmacy? Yes & they told him that its expired  CIALIS 20mg   (Agent: If no, request that the patient contact the pharmacy for the refill.)   Preferred Pharmacy (with phone number or street name): [facility] [address] in [place]   Agent: Please be advised that RX refills may take up to 3 business days. We ask that you follow-up with your pharmacy.

## 2017-04-07 ENCOUNTER — Other Ambulatory Visit: Payer: Self-pay | Admitting: Physician Assistant

## 2017-04-07 NOTE — Telephone Encounter (Signed)
PCP refilled medication.

## 2017-04-07 NOTE — Telephone Encounter (Signed)
Called and spoke with Pharmacy as the Cialis was written on 01/16/17 with 3 refills. / CIALIS 20 MG tablet 1 tablet 3 01/16/2017    Sig: TAKE 1 TABLET BY MOUTH ONCE DAILY AS NEEDED FOR ERECTILE DISFUNCTION   Sent to pharmacy as: CIALIS 20 MG tablet   Notes to Pharmacy: Please consider 90 day supplies to promote better adherence   E-Prescribing Status: Receipt confirmed by pharmacy (01/16/2017 11:52 AM EDT)    / Per pharmacy it was only written for 1 tablet with 3 refills. / LOV 02/24/2017 with Malva Coganody Martin / Please send in new prescription if able.  [facility] [address] in [place]

## 2017-04-09 ENCOUNTER — Other Ambulatory Visit: Payer: BLUE CROSS/BLUE SHIELD

## 2017-05-18 DIAGNOSIS — K523 Indeterminate colitis: Secondary | ICD-10-CM | POA: Diagnosis not present

## 2017-05-30 ENCOUNTER — Other Ambulatory Visit: Payer: Self-pay | Admitting: Physician Assistant

## 2017-06-02 ENCOUNTER — Emergency Department (HOSPITAL_BASED_OUTPATIENT_CLINIC_OR_DEPARTMENT_OTHER)
Admission: EM | Admit: 2017-06-02 | Discharge: 2017-06-02 | Disposition: A | Discharge status: Home / Self Care | Payer: BLUE CROSS/BLUE SHIELD | Attending: Emergency Medicine | Admitting: Emergency Medicine

## 2017-06-02 ENCOUNTER — Encounter (HOSPITAL_BASED_OUTPATIENT_CLINIC_OR_DEPARTMENT_OTHER): Payer: Self-pay | Admitting: Emergency Medicine

## 2017-06-02 ENCOUNTER — Other Ambulatory Visit: Payer: Self-pay

## 2017-06-02 DIAGNOSIS — I10 Essential (primary) hypertension: Secondary | ICD-10-CM | POA: Insufficient documentation

## 2017-06-02 DIAGNOSIS — Z79899 Other long term (current) drug therapy: Secondary | ICD-10-CM | POA: Insufficient documentation

## 2017-06-02 DIAGNOSIS — R197 Diarrhea, unspecified: Secondary | ICD-10-CM | POA: Diagnosis not present

## 2017-06-02 DIAGNOSIS — R112 Nausea with vomiting, unspecified: Secondary | ICD-10-CM | POA: Diagnosis not present

## 2017-06-02 DIAGNOSIS — R1110 Vomiting, unspecified: Secondary | ICD-10-CM | POA: Diagnosis present

## 2017-06-02 LAB — COMPREHENSIVE METABOLIC PANEL
ALBUMIN: 4.7 g/dL (ref 3.5–5.0)
ALK PHOS: 79 U/L (ref 38–126)
ALT: 18 U/L (ref 17–63)
AST: 21 U/L (ref 15–41)
Anion gap: 11 (ref 5–15)
BUN: 11 mg/dL (ref 6–20)
CALCIUM: 9.2 mg/dL (ref 8.9–10.3)
CO2: 25 mmol/L (ref 22–32)
Chloride: 100 mmol/L — ABNORMAL LOW (ref 101–111)
Creatinine, Ser: 1.15 mg/dL (ref 0.61–1.24)
GFR calc Af Amer: >60 mL/min (ref >60)
GLUCOSE: 94 mg/dL (ref 65–99)
POTASSIUM: 3.6 mmol/L (ref 3.5–5.1)
Sodium: 136 mmol/L (ref 135–145)
TOTAL PROTEIN: 8.5 g/dL — AB (ref 6.5–8.1)
Total Bilirubin: 0.6 mg/dL (ref 0.3–1.2)

## 2017-06-02 LAB — CBC
HCT: 40.6 % (ref 39.0–52.0)
Hemoglobin: 14.6 g/dL (ref 13.0–17.0)
MCH: 32.3 pg (ref 26.0–34.0)
MCHC: 36 g/dL (ref 30.0–36.0)
MCV: 89.8 fL (ref 78.0–100.0)
PLATELETS: 266 10*3/uL (ref 150–400)
RBC: 4.52 MIL/uL (ref 4.22–5.81)
RDW: 13.2 % (ref 11.5–15.5)
WBC: 5.4 10*3/uL (ref 4.0–10.5)

## 2017-06-02 LAB — URINALYSIS, ROUTINE W REFLEX MICROSCOPIC
BILIRUBIN URINE: NEGATIVE
Glucose, UA: NEGATIVE mg/dL
Hgb urine dipstick: NEGATIVE
KETONES UR: NEGATIVE mg/dL
LEUKOCYTES UA: NEGATIVE
Nitrite: NEGATIVE
PROTEIN: NEGATIVE mg/dL
Specific Gravity, Urine: 1.01 (ref 1.005–1.030)
pH: 6.5 (ref 5.0–8.0)

## 2017-06-02 LAB — LIPASE, BLOOD: Lipase: 25 U/L (ref 11–51)

## 2017-06-02 MED ORDER — ONDANSETRON 4 MG PO TBDP: 4.0000 mg | ORAL_TABLET | Freq: Three times a day (TID) | ORAL | 0 refills | Status: DC | PRN

## 2017-06-02 MED ORDER — ONDANSETRON 4 MG PO TBDP
4.0000 mg | ORAL_TABLET | Freq: Once | ORAL | Status: AC | PRN
  Administered 2017-06-02: 4 mg via ORAL
  Filled 2017-06-02: qty 1

## 2017-06-02 NOTE — Discharge Instructions (Signed)
Please read attached information regarding your condition. Take Zofran as needed for nausea. Push fluids to maintain adequate hydration, and slowly advance your diet. Follow-up with your PCP for further evaluation if symptoms persist. Return to ED for worsening symptoms, severe abdominal pain, increased vomiting despite the use of medication, blood in stool or vomit.

## 2017-06-02 NOTE — ED Triage Notes (Signed)
Patient states that he has had pain to his lower back with N/V/D since yesterday

## 2017-06-02 NOTE — ED Provider Notes (Signed)
[facility] EMERGENCY DEPARTMENT Provider Note   CSN: [id number] Arrival date & time: 06/02/17  1447     History   Chief Complaint Chief Complaint  Patient presents with  . Emesis    HPI John Simmons is a 42 y.o. male with a past medical history of GERD, hypertension, who presents to ED for evaluation of 2-day history of nausea, NBNB emesis, nonbloody diarrhea since yesterday.  Reports sick contacts at work with similar symptoms.  He is not taking any medications at home to help with symptoms.  He reports feeling nauseous every time he tries to eat something.  Denies any fevers, abdominal pain, urinary symptoms, travel outside of the country recently.  HPI  Past Medical History:  Diagnosis Date  . Chronic neck pain   . GERD (gastroesophageal reflux disease)   . Gout   . Hypertension     Patient Active Problem List   Diagnosis Date Noted  . Hyperlipidemia 05/05/2016  . Prostate cancer screening 12/21/2015  . Visit for preventive health examination 12/21/2015  . Anxiety and depression 02/27/2015  . Essential hypertension, benign 03/08/2014  . GERD (gastroesophageal reflux disease) 03/08/2014    Past Surgical History:  Procedure Laterality Date  . HEMORRHOID BANDING  2016  . negative         Home Medications    Prior to Admission medications   Medication Sig Start Date End Date Taking? Authorizing Provider  allopurinol (ZYLOPRIM) 100 MG tablet  11/28/16   [provider]  amLODipine (NORVASC) 10 MG tablet Take 1 tablet (10 mg total) by mouth daily. 02/05/17   Saguier, Ramon DredgeEdward, PA-C  atorvastatin (LIPITOR) 10 MG tablet TAKE ONE TABLET BY MOUTH ONCE DAILY 07/28/16   Waldon MerlMartin, William C, PA-C  colchicine 0.6 MG tablet Take 2 tablets by mouth. Take 3rd table 1 hour later. 08/08/16   Waldon MerlMartin, William C, PA-C  lisinopril (PRINIVIL,ZESTRIL) 40 MG tablet Take 1 tablet (40 mg total) by mouth daily. 02/02/17   Saguier, Ramon DredgeEdward, PA-C  omeprazole (PRILOSEC) 20  MG capsule Take 1 capsule (20 mg total) by mouth 2 (two) times daily before a meal. 04/29/16   Cheri Fowlerose, Kayla, PA-C  ondansetron (ZOFRAN ODT) 4 MG disintegrating tablet Take 1 tablet (4 mg total) by mouth every 8 (eight) hours as needed for nausea or vomiting. 06/02/17   Marlies Ligman, PA-C  tadalafil (CIALIS) 20 MG tablet TAKE 1 TABLET BY MOUTH ONCE DAILY AS NEEDED FOR ERECTILE DISFUNCTION 05/30/17   Waldon MerlMartin, William C, PA-C    Family History Family History  Problem Relation Age of Onset  . Hypertension Mother   . Diabetes Mother   . Hypertension Father     Social History Social History   Tobacco Use  . Smoking status: Never Smoker  . Smokeless tobacco: Never Used  Substance Use Topics  . Alcohol use: Yes    Comment: occasionally  . Drug use: No     Allergies   Patient has no known allergies.   Review of Systems Review of Systems  Constitutional: Negative for appetite change, chills and fever.  HENT: Negative for ear pain, rhinorrhea, sneezing and sore throat.   Eyes: Negative for photophobia and visual disturbance.  Respiratory: Negative for cough, chest tightness, shortness of breath and wheezing.   Cardiovascular: Negative for chest pain and palpitations.  Gastrointestinal: Positive for diarrhea, nausea and vomiting. Negative for abdominal pain, blood in stool and constipation.  Genitourinary: Negative for dysuria, hematuria and urgency.  Musculoskeletal: Negative for myalgias.  Skin: Negative for rash.  Neurological: Negative for dizziness, weakness and light-headedness.     Physical Exam Updated Vital Signs BP (!) 138/96 (BP Location: Left Arm)   Pulse 80   Temp 98.2 F (36.8 C) (Oral)   Resp 16   Ht 5\' 9"  (1.753 m)   Wt 88.5 kg (195 lb)   SpO2 100%   BMI 28.80 kg/m   Physical Exam  Constitutional: He appears well-developed and well-nourished. No distress.  Nontoxic appearing and in no acute distress.  Does not appear dehydrated.  HENT:  Head: Normocephalic  and atraumatic.  Nose: Nose normal.  Eyes: Conjunctivae and EOM are normal. Left eye exhibits no discharge. No scleral icterus.  Neck: Normal range of motion. Neck supple.  Cardiovascular: Normal rate, regular rhythm, normal heart sounds and intact distal pulses. Exam reveals no gallop and no friction rub.  No murmur heard. Pulmonary/Chest: Effort normal and breath sounds normal. No respiratory distress.  Abdominal: Soft. Bowel sounds are normal. He exhibits no distension. There is no tenderness. There is no guarding.  No abdominal tenderness to palpation.  Musculoskeletal: Normal range of motion. He exhibits no edema.  Neurological: He is alert. He exhibits normal muscle tone. Coordination normal.  Skin: Skin is warm and dry. No rash noted.  Psychiatric: He has a normal mood and affect.  Nursing note and vitals reviewed.    ED Treatments / Results  Labs (all labs ordered are listed, but only abnormal results are displayed) Labs Reviewed  COMPREHENSIVE METABOLIC PANEL - Abnormal; Notable for the following components:      Result Value   Chloride 100 (*)    Total Protein 8.5 (*)    All other components within normal limits  LIPASE, BLOOD  CBC  URINALYSIS, ROUTINE W REFLEX MICROSCOPIC    EKG  EKG Interpretation None       Radiology No results found.  Procedures Procedures (including critical care time)  Medications Ordered in ED Medications  ondansetron (ZOFRAN-ODT) disintegrating tablet 4 mg (4 mg Oral Given 06/02/17 1507)     Initial Impression / Assessment and Plan / ED Course  I have reviewed the triage vital signs and the nursing notes.  Pertinent labs & imaging results that were available during my care of the patient were reviewed by me and considered in my medical decision making (see chart for details).     Patient presents to ED for evaluation of 2-day history of nausea, NBNB emesis, nonbloody diarrhea.  Sick contacts at work with similar symptoms.  He  is afebrile and denies any abdominal pain, urinary symptoms, recent travel out of the country.  On physical exam he is overall well-appearing.  He has no abdominal tenderness to palpation he does not appear dehydrated.  He was given Zofran here in the ED and is able to tolerate p.o. intake without difficulty.  His lab work including CBC, CMP, lipase, urinalysis all unremarkable and show no signs of dehydration or severe infection.  Will give patient Zofran to take home for what appears to be viral gastroenteritis.  Encouraged him to slowly increase p.o. intake as tolerated.  Patient appears stable for discharge at this time.  Strict return precautions given.  Portions of this note were generated with Scientist, clinical (histocompatibility and immunogenetics). Dictation errors may occur despite best attempts at proofreading.   Final Clinical Impressions(s) / ED Diagnoses   Final diagnoses:  Nausea vomiting and diarrhea    ED Discharge Orders        Ordered  ondansetron (ZOFRAN ODT) 4 MG disintegrating tablet  Every 8 hours PRN     06/02/17 1737       Dietrich Pates, PA-C 06/02/17 1739    Doug Sou, MD 06/03/17 0021

## 2017-06-04 ENCOUNTER — Telehealth: Payer: Self-pay | Admitting: Physician Assistant

## 2017-06-04 NOTE — Telephone Encounter (Signed)
Copied from CRM [id number]. Topic: Quick Communication - See Telephone Encounter >> Jun 04, 2017  3:58 PM Windy KalataMichael, Granvil Djordjevic L, NT wrote: CRM for notification. See Telephone encounter for:  06/04/17.  Pt is calling and requesting a refill on his pre gout medication (pt is unaware of the name).   [facility] [address] - [place], [place] - [id number]

## 2017-06-05 NOTE — Telephone Encounter (Signed)
We received [facility] .  The prescription was written on (04/23/17) by  Laurette Schimkeebecca Owens,NP.  Directions:Take 1 to 2 capsules by mouth three times daily, #90.//AB/CMA

## 2017-06-05 NOTE — Telephone Encounter (Signed)
LMOVM advising patient his PCP has not refilled the Indomethacin medication. We prescribed Allopurinol and Colchine for the gout. Recommend scheduling an appointment for follow up gout to check labs and make sure mediation is managing flare up.

## 2017-06-10 NOTE — Telephone Encounter (Signed)
Pharmacy request for Indomethacin denied. Patient needs an appointment for any gout flare ups.

## 2017-06-12 DIAGNOSIS — E782 Mixed hyperlipidemia: Secondary | ICD-10-CM | POA: Diagnosis not present

## 2017-06-12 DIAGNOSIS — M109 Gout, unspecified: Secondary | ICD-10-CM | POA: Diagnosis not present

## 2017-06-12 DIAGNOSIS — I10 Essential (primary) hypertension: Secondary | ICD-10-CM | POA: Diagnosis not present

## 2017-06-12 DIAGNOSIS — K219 Gastro-esophageal reflux disease without esophagitis: Secondary | ICD-10-CM | POA: Diagnosis not present

## 2017-06-30 ENCOUNTER — Telehealth: Payer: Self-pay | Admitting: Physician Assistant

## 2017-06-30 ENCOUNTER — Other Ambulatory Visit: Payer: Self-pay | Admitting: Physician Assistant

## 2017-06-30 NOTE — Telephone Encounter (Signed)
Copied from CRM [id number]. Topic: Quick Communication - Rx Refill/Question >> Jun 30, 2017  4:28 PM Rudi CocoLathan, Priyana Mccarey M, [place] wrote: Medication: tadalafil (CIALIS) 20 MG tablet [[id number]][[id number]]    Has the patient contacted their pharmacy?yes   (Agent: If no, request that the patient contact the pharmacy for the refill.)   Preferred Pharmacy (with phone number or street name): [facility] [address] - [place], [place] - [address] [place] [address] Phone: [phone] Fax: [phone]     Agent: Please be advised that RX refills may take up to 3 business days. We ask that you follow-up with your pharmacy.

## 2017-07-01 NOTE — Telephone Encounter (Signed)
Pended by Marcelline MatesWilliam Martin on 06/30/17

## 2017-08-07 DIAGNOSIS — Z0000 Encounter for general adult medical examination without abnormal findings: Secondary | ICD-10-CM | POA: Diagnosis not present

## 2017-08-07 DIAGNOSIS — M109 Gout, unspecified: Secondary | ICD-10-CM | POA: Diagnosis not present

## 2017-09-11 DIAGNOSIS — Z760 Encounter for issue of repeat prescription: Secondary | ICD-10-CM | POA: Diagnosis not present

## 2017-09-22 ENCOUNTER — Encounter (HOSPITAL_BASED_OUTPATIENT_CLINIC_OR_DEPARTMENT_OTHER): Payer: Self-pay | Admitting: *Deleted

## 2017-09-22 ENCOUNTER — Other Ambulatory Visit: Payer: Self-pay

## 2017-09-22 ENCOUNTER — Emergency Department (HOSPITAL_BASED_OUTPATIENT_CLINIC_OR_DEPARTMENT_OTHER)
Admission: EM | Admit: 2017-09-22 | Discharge: 2017-09-23 | Disposition: A | Discharge status: Home / Self Care | Payer: BLUE CROSS/BLUE SHIELD | Attending: Emergency Medicine | Admitting: Emergency Medicine

## 2017-09-22 DIAGNOSIS — N342 Other urethritis: Secondary | ICD-10-CM | POA: Diagnosis not present

## 2017-09-22 DIAGNOSIS — Z79899 Other long term (current) drug therapy: Secondary | ICD-10-CM | POA: Diagnosis not present

## 2017-09-22 DIAGNOSIS — I10 Essential (primary) hypertension: Secondary | ICD-10-CM | POA: Insufficient documentation

## 2017-09-22 DIAGNOSIS — R369 Urethral discharge, unspecified: Secondary | ICD-10-CM | POA: Diagnosis present

## 2017-09-22 DIAGNOSIS — R309 Painful micturition, unspecified: Secondary | ICD-10-CM | POA: Diagnosis not present

## 2017-09-22 LAB — URINALYSIS, ROUTINE W REFLEX MICROSCOPIC
BILIRUBIN URINE: NEGATIVE
Glucose, UA: NEGATIVE mg/dL
Ketones, ur: NEGATIVE mg/dL
Nitrite: NEGATIVE
PH: 6 (ref 5.0–8.0)
Protein, ur: NEGATIVE mg/dL
SPECIFIC GRAVITY, URINE: 1.02 (ref 1.005–1.030)

## 2017-09-22 LAB — URINALYSIS, MICROSCOPIC (REFLEX): WBC, UA: >50 WBC/hpf (ref 0–5)

## 2017-09-22 NOTE — ED Triage Notes (Signed)
States he has a yellow discharge from his penis. He is very upset and tearful at triage. His wife denies having an affair. He wants to be checked for STD's.

## 2017-09-22 NOTE — ED Notes (Signed)
Pt requesting treatment for STD exposure

## 2017-09-23 MED ORDER — AZITHROMYCIN 250 MG PO TABS
1000.0000 mg | ORAL_TABLET | Freq: Once | ORAL | Status: AC
  Administered 2017-09-23: 1000 mg via ORAL
  Filled 2017-09-23: qty 4

## 2017-09-23 MED ORDER — CEFTRIAXONE SODIUM 250 MG IJ SOLR
250.0000 mg | Freq: Once | INTRAMUSCULAR | Status: AC
  Administered 2017-09-23: 250 mg via INTRAMUSCULAR
  Filled 2017-09-23: qty 250

## 2017-09-23 NOTE — ED Provider Notes (Signed)
[facility] Provider Note: John Dell, MD, FACEP  CSN: [id number] MRN: [id number] ARRIVAL: 09/22/17 at 2224 ROOM: MH01/MH01   CHIEF COMPLAINT  Penile Discharge   HISTORY OF PRESENT ILLNESS  09/23/17 1:50 AM John Simmons is a 42 y.o. male who had sexual intercourse with his wife about 6 days ago for the first time in 2 months.  Yesterday he developed burning with urination and a profuse yellow urethral discharge.  He suspects he has an STD.  He denies fever, chills, nausea, vomiting, diarrhea or abdominal pain.  He is emotionally distraught as a result of these events.   Past Medical History:  Diagnosis Date  . Chronic neck pain   . GERD (gastroesophageal reflux disease)   . Gout   . Hypertension     Past Surgical History:  Procedure Laterality Date  . HEMORRHOID BANDING  2016  . negative      Family History  Problem Relation Age of Onset  . Hypertension Mother   . Diabetes Mother   . Hypertension Father     Social History   Tobacco Use  . Smoking status: Never Smoker  . Smokeless tobacco: Never Used  Substance Use Topics  . Alcohol use: Yes    Comment: occasionally  . Drug use: No    Prior to Admission medications   Medication Sig Start Date End Date Taking? Authorizing Provider  allopurinol (ZYLOPRIM) 100 MG tablet  11/28/16   [provider]  amLODipine (NORVASC) 10 MG tablet Take 1 tablet (10 mg total) by mouth daily. 02/05/17   Saguier, Ramon Dredge, PA-C  atorvastatin (LIPITOR) 10 MG tablet TAKE ONE TABLET BY MOUTH ONCE DAILY 07/28/16   Waldon Merl, PA-C  colchicine 0.6 MG tablet Take 2 tablets by mouth. Take 3rd table 1 hour later. 08/08/16   Waldon Merl, PA-C  lisinopril (PRINIVIL,ZESTRIL) 40 MG tablet Take 1 tablet (40 mg total) by mouth daily. 02/02/17   Saguier, Ramon Dredge, PA-C  omeprazole (PRILOSEC) 20 MG capsule Take 1 capsule (20 mg total) by mouth 2 (two) times daily before a meal. 04/29/16   Cheri Fowler, PA-C    ondansetron (ZOFRAN ODT) 4 MG disintegrating tablet Take 1 tablet (4 mg total) by mouth every 8 (eight) hours as needed for nausea or vomiting. 06/02/17   Khatri, Hina, PA-C  tadalafil (CIALIS) 20 MG tablet TAKE 1 TABLET BY MOUTH ONCE DAILY AS NEEDED FOR ERECTILE DISFUNCTION 07/01/17   Waldon Merl, PA-C    Allergies Patient has no known allergies.   REVIEW OF SYSTEMS  Negative except as noted here or in the History of Present Illness.   PHYSICAL EXAMINATION  Initial Vital Signs Blood pressure (!) 131/95, pulse 74, temperature 98.1 F (36.7 C), temperature source Oral, resp. rate 16, height 5\' 9"  (1.753 m), weight 88.9 kg (196 lb), SpO2 100 %.  Examination General: Well-developed, well-nourished male in no acute distress; appearance consistent with age of record HENT: normocephalic; atraumatic Eyes: Normal appearance Neck: supple Heart: regular rate and rhythm Lungs: clear to auscultation bilaterally Abdomen: soft; nondistended; pressure on bladder reproduces urge to urinate; bowel sounds present GU: Tanner V male, circumcised; profuse yellow urethral discharge Extremities: No deformity; full range of motion Neurologic: Awake, alert and oriented; motor function intact in all extremities and symmetric; no facial droop Skin: Warm and dry Psychiatric: Tearful   RESULTS  Summary of this visit's results, reviewed by myself:   EKG Interpretation  Date/Time:    Ventricular Rate:  PR Interval:    QRS Duration:   QT Interval:    QTC Calculation:   R Axis:     Text Interpretation:        Laboratory Studies: Results for orders placed or performed during the hospital encounter of 09/22/17 (from the past 24 hour(s))  Urinalysis, Routine w reflex microscopic     Status: Abnormal   Collection Time: 09/22/17 10:41 PM  Result Value Ref Range   Color, Urine YELLOW YELLOW   APPearance CLOUDY (A) CLEAR   Specific Gravity, Urine 1.020 1.005 - 1.030   pH 6.0 5.0 - 8.0    Glucose, UA NEGATIVE NEGATIVE mg/dL   Hgb urine dipstick TRACE (A) NEGATIVE   Bilirubin Urine NEGATIVE NEGATIVE   Ketones, ur NEGATIVE NEGATIVE mg/dL   Protein, ur NEGATIVE NEGATIVE mg/dL   Nitrite NEGATIVE NEGATIVE   Leukocytes, UA LARGE (A) NEGATIVE  Urinalysis, Microscopic (reflex)     Status: Abnormal   Collection Time: 09/22/17 10:41 PM  Result Value Ref Range   RBC / HPF 6-10 0 - 5 RBC/hpf   WBC, UA >50 0 - 5 WBC/hpf   Bacteria, UA RARE (A) NONE SEEN   Squamous Epithelial / LPF 0-5 0 - 5   Imaging Studies: No results found.  ED COURSE and MDM  Nursing notes and initial vitals signs, including pulse oximetry, reviewed.  Vitals:   09/22/17 2230 09/22/17 2232  BP: (!) 131/95   Pulse: 74   Resp: 16   Temp: 98.1 F (36.7 C)   TempSrc: Oral   SpO2: 100%   Weight:  88.9 kg (196 lb)  Height:  5\' 9"  (1.753 m)   Examination and symptoms consistent with acute urethritis, likely gonorrhea.  Will treat for GC and chlamydia.  Patient declines HIV and RPR as he has recently been tested for these.  PROCEDURES    ED DIAGNOSES     ICD-10-CM   1. Urethritis N34.2        Calel Pisarski, MD 09/23/17 0201

## 2017-09-23 NOTE — ED Notes (Signed)
ED Provider at bedside. 

## 2017-09-24 LAB — URINE CULTURE: CULTURE: NO GROWTH

## 2017-09-24 LAB — GC/CHLAMYDIA PROBE AMP (~~LOC~~) NOT AT ARMC
Chlamydia: NEGATIVE
Neisseria Gonorrhea: POSITIVE — AB

## 2017-11-02 ENCOUNTER — Other Ambulatory Visit: Payer: Self-pay | Admitting: Physician Assistant

## 2017-11-13 DIAGNOSIS — K219 Gastro-esophageal reflux disease without esophagitis: Secondary | ICD-10-CM | POA: Diagnosis not present

## 2017-12-02 DIAGNOSIS — M109 Gout, unspecified: Secondary | ICD-10-CM | POA: Diagnosis not present

## 2017-12-02 DIAGNOSIS — E782 Mixed hyperlipidemia: Secondary | ICD-10-CM | POA: Diagnosis not present

## 2017-12-02 DIAGNOSIS — K219 Gastro-esophageal reflux disease without esophagitis: Secondary | ICD-10-CM | POA: Diagnosis not present

## 2017-12-02 DIAGNOSIS — I10 Essential (primary) hypertension: Secondary | ICD-10-CM | POA: Diagnosis not present

## 2017-12-11 DIAGNOSIS — R109 Unspecified abdominal pain: Secondary | ICD-10-CM | POA: Diagnosis not present

## 2017-12-11 DIAGNOSIS — Z760 Encounter for issue of repeat prescription: Secondary | ICD-10-CM | POA: Diagnosis not present

## 2017-12-30 ENCOUNTER — Encounter (HOSPITAL_BASED_OUTPATIENT_CLINIC_OR_DEPARTMENT_OTHER): Payer: Self-pay | Admitting: Emergency Medicine

## 2017-12-30 ENCOUNTER — Emergency Department (HOSPITAL_BASED_OUTPATIENT_CLINIC_OR_DEPARTMENT_OTHER)
Admission: EM | Admit: 2017-12-30 | Discharge: 2017-12-30 | Disposition: A | Discharge status: Home / Self Care | Payer: BLUE CROSS/BLUE SHIELD | Attending: Emergency Medicine | Admitting: Emergency Medicine

## 2017-12-30 ENCOUNTER — Other Ambulatory Visit: Payer: Self-pay

## 2017-12-30 DIAGNOSIS — Z79899 Other long term (current) drug therapy: Secondary | ICD-10-CM | POA: Insufficient documentation

## 2017-12-30 DIAGNOSIS — K219 Gastro-esophageal reflux disease without esophagitis: Secondary | ICD-10-CM | POA: Diagnosis not present

## 2017-12-30 DIAGNOSIS — R12 Heartburn: Secondary | ICD-10-CM | POA: Diagnosis present

## 2017-12-30 DIAGNOSIS — I10 Essential (primary) hypertension: Secondary | ICD-10-CM | POA: Insufficient documentation

## 2017-12-30 MED ORDER — GI COCKTAIL ~~LOC~~
30.0000 mL | Freq: Once | ORAL | Status: AC
  Administered 2017-12-30: 30 mL via ORAL
  Filled 2017-12-30: qty 30

## 2017-12-30 MED ORDER — SUCRALFATE 1 G PO TABS: 1.0000 g | ORAL_TABLET | Freq: Three times a day (TID) | ORAL | 0 refills | Status: DC

## 2017-12-30 NOTE — ED Provider Notes (Signed)
[facility] EMERGENCY DEPARTMENT Provider Note   CSN: [id number] Arrival date & time: 12/30/17  0825     History   Chief Complaint Chief Complaint  Patient presents with  . Heartburn    HPI John Simmons is a 42 y.o. male.  HPI Patient presents with complaint of GERD.  States that he has a history of GERD and has been taking his Prilosec.  For the last couple days has had more pain in his chest.  States it is dull.  Worse after eating.  Feels like previous GERD.  States he ate some spaghetti yesterday that made the pain worse.  Has had previous EGD.  Pain has been worse when he lays flat and is been sleeping up with pillows to help. Past Medical History:  Diagnosis Date  . Chronic neck pain   . GERD (gastroesophageal reflux disease)   . Gout   . Hypertension     Patient Active Problem List   Diagnosis Date Noted  . Hyperlipidemia 05/05/2016  . Prostate cancer screening 12/21/2015  . Visit for preventive health examination 12/21/2015  . Anxiety and depression 02/27/2015  . Essential hypertension, benign 03/08/2014  . GERD (gastroesophageal reflux disease) 03/08/2014    Past Surgical History:  Procedure Laterality Date  . HEMORRHOID BANDING  2016  . negative          Home Medications    Prior to Admission medications   Medication Sig Start Date End Date Taking? Authorizing Provider  atorvastatin (LIPITOR) 10 MG tablet TAKE ONE TABLET BY MOUTH ONCE DAILY 07/28/16   Waldon Merl, PA-C  lisinopril (PRINIVIL,ZESTRIL) 40 MG tablet Take 1 tablet (40 mg total) by mouth daily. 02/02/17   Saguier, Ramon Dredge, PA-C  omeprazole (PRILOSEC) 20 MG capsule Take 1 capsule (20 mg total) by mouth 2 (two) times daily before a meal. 04/29/16   Cheri Fowler, PA-C  sucralfate (CARAFATE) 1 g tablet Take 1 tablet (1 g total) by mouth 4 (four) times daily -  with meals and at bedtime. 12/30/17   Benjiman Core, MD  tadalafil (ADCIRCA/CIALIS) 20 MG tablet TAKE 1 TABLET BY MOUTH  ONCE DAILY AS NEEDED FOR ERECTILE DISFUNCTION 11/03/17   Waldon Merl, PA-C    Family History Family History  Problem Relation Age of Onset  . Hypertension Mother   . Diabetes Mother   . Hypertension Father     Social History Social History   Tobacco Use  . Smoking status: Never Smoker  . Smokeless tobacco: Never Used  Substance Use Topics  . Alcohol use: Yes    Comment: occasionally  . Drug use: No     Allergies   Patient has no known allergies.   Review of Systems Review of Systems  Constitutional: Negative for appetite change.  HENT: Negative for congestion.   Cardiovascular: Positive for chest pain.  Gastrointestinal: Negative for abdominal pain.  Genitourinary: Negative for flank pain.  Musculoskeletal: Negative for back pain.  Skin: Negative for rash.  Neurological: Negative for weakness.  Hematological: Negative for adenopathy.  Psychiatric/Behavioral: Negative for confusion.     Physical Exam Updated Vital Signs BP (!) 129/96   Pulse 81   Temp 98.6 F (37 C) (Oral)   Resp 18   Ht 5\' 9"  (1.753 m)   Wt 88.5 kg   SpO2 95%   BMI 28.80 kg/m   Physical Exam  Constitutional: He appears well-developed.  HENT:  Head: Normocephalic.  Eyes: EOM are normal.  Neck: Neck supple.  Cardiovascular: Normal rate.  Pulmonary/Chest: Effort normal.  Abdominal: There is no tenderness.  Musculoskeletal: He exhibits no edema.  Neurological: He is alert.  Skin: Skin is warm. Capillary refill takes less than 2 seconds.     ED Treatments / Results  Labs (all labs ordered are listed, but only abnormal results are displayed) Labs Reviewed - No data to display  EKG EKG Interpretation  Date/Time:  Wednesday December 30 2017 09:06:18 EDT Ventricular Rate:  83 PR Interval:    QRS Duration: 87 QT Interval:  337 QTC Calculation: 396 R Axis:   79 Text Interpretation:  Sinus rhythm Consider left ventricular hypertrophy No significant change since last  tracing Confirmed by Benjiman Core [phone]) on 12/30/2017 9:09:31 AM Also confirmed by Benjiman Core [phone]), editor Barbette Hair [phone])  on 12/30/2017 9:41:58 AM   Radiology No results found.  Procedures Procedures (including critical care time)  Medications Ordered in ED Medications  gi cocktail (Maalox,Lidocaine,Donnatal) (30 mLs Oral Given 12/30/17 0914)     Initial Impression / Assessment and Plan / ED Course  I have reviewed the triage vital signs and the nursing notes.  Pertinent labs & imaging results that were available during my care of the patient were reviewed by me and considered in my medical decision making (see chart for details).    Patient with chest pain.  Feels like previous GERD.  EKG reassuring.  Already on omeprazole.  Will add Carafate and have follow-up with PCP. Final Clinical Impressions(s) / ED Diagnoses   Final diagnoses:  Gastroesophageal reflux disease, esophagitis presence not specified    ED Discharge Orders         Ordered    sucralfate (CARAFATE) 1 g tablet  3 times daily with meals & bedtime     12/30/17 2426           Benjiman Core, MD 12/30/17 [phone]

## 2017-12-30 NOTE — ED Triage Notes (Signed)
Pt having heartburn worse than usual for two days.  Pt on Prilosec and usually it works but not for last two days.

## 2018-01-25 DIAGNOSIS — Z23 Encounter for immunization: Secondary | ICD-10-CM | POA: Diagnosis not present

## 2018-01-25 DIAGNOSIS — E782 Mixed hyperlipidemia: Secondary | ICD-10-CM | POA: Diagnosis not present

## 2018-01-25 DIAGNOSIS — M109 Gout, unspecified: Secondary | ICD-10-CM | POA: Diagnosis not present

## 2018-01-25 DIAGNOSIS — K219 Gastro-esophageal reflux disease without esophagitis: Secondary | ICD-10-CM | POA: Diagnosis not present

## 2018-01-25 DIAGNOSIS — I10 Essential (primary) hypertension: Secondary | ICD-10-CM | POA: Diagnosis not present

## 2018-02-12 ENCOUNTER — Telehealth: Payer: Self-pay | Admitting: Physician Assistant

## 2018-02-12 NOTE — Telephone Encounter (Signed)
I have placed FMLA forms in the bin upfront with a charge sheet, pt has an appt on Monday 10/28 with cody.

## 2018-02-15 ENCOUNTER — Ambulatory Visit (INDEPENDENT_AMBULATORY_CARE_PROVIDER_SITE_OTHER): Payer: BLUE CROSS/BLUE SHIELD | Admitting: Physician Assistant

## 2018-02-15 ENCOUNTER — Encounter: Payer: Self-pay | Admitting: Emergency Medicine

## 2018-02-15 ENCOUNTER — Encounter: Payer: Self-pay | Admitting: Physician Assistant

## 2018-02-15 VITALS — BP 122/90 | HR 82 | Temp 98.3°F | Resp 14 | Ht 69.0 in | Wt 204.0 lb

## 2018-02-15 DIAGNOSIS — K219 Gastro-esophageal reflux disease without esophagitis: Secondary | ICD-10-CM | POA: Diagnosis not present

## 2018-02-15 DIAGNOSIS — M1A9XX0 Chronic gout, unspecified, without tophus (tophi): Secondary | ICD-10-CM

## 2018-02-15 DIAGNOSIS — I10 Essential (primary) hypertension: Secondary | ICD-10-CM

## 2018-02-15 MED ORDER — ALLOPURINOL 300 MG PO TABS: 300.0000 mg | ORAL_TABLET | Freq: Every day | ORAL | 3 refills | Status: DC

## 2018-02-15 MED ORDER — ESOMEPRAZOLE MAGNESIUM 20 MG PO PACK: 20.0000 mg | PACK | Freq: Every day | ORAL | 12 refills | Status: DC

## 2018-02-15 MED ORDER — COLCHICINE 0.6 MG PO TABS: 0.6000 mg | ORAL_TABLET | Freq: Two times a day (BID) | ORAL | 0 refills | Status: DC

## 2018-02-15 NOTE — Patient Instructions (Signed)
Please restart the allopurinol daily. I have sent in a new prescription. Since you feel like an attack may be starting, I am also giving you colchicine to take as directed while restarting the colchicine.  Avoid trigger foods -- see list below.  Continue BP medication as directed. Limit salt intake and keep active to promote weight loss.   Stop the Prilosec and start the Nexium daily.  Follow-up with me in 3-4 weeks for reassessment.

## 2018-02-15 NOTE — Assessment & Plan Note (Signed)
GERD diet reviewed. Will hold off on Prilosec and attempt trial back on Nexium. Close follow-up scheduled.

## 2018-02-15 NOTE — Telephone Encounter (Signed)
Paperwork in your basket for completion after appointment today

## 2018-02-15 NOTE — Assessment & Plan Note (Signed)
Restart Allopurinol 300 mg daily. Uric acid level today. Giving potential start of a flare, will give 7 days of colchicine BID as well. Follow-up discussed.

## 2018-02-15 NOTE — Progress Notes (Signed)
Patient presents to clinic today for follow-up of chronic issues and to discuss the possibility of FMLA paperwork.  Hypertension -- Patient is currently on a regimen of lisinopril 40 mg daily. Is taking as directed. Notes tolerating well. Is trying to work harder on diet and exercise. Patient denies chest pain, palpitations, lightheadedness, dizziness, vision changes or frequent headaches.  BP Readings from Last 3 Encounters:  02/15/18 122/90  12/30/17 (!) 129/96  09/23/17 (!) 140/98   GERD -- Patient currently on regimen of Prilosec 40 mg daily. Is taking as directed but notes he still occasionally has breakthrough symptoms. Notes he did much better on Nexium and would like to restart this.   Gout -- Patient on a regimen of allopurinol 300 mg daily. Has been out of medication for 6 months. Notes a few ut in the past months. Notes some mild pain starting today in L great toe. Is concerned about a flare. Avoids alcohol and trigger foods.   Past Medical History:  Diagnosis Date  . Chronic neck pain   . GERD (gastroesophageal reflux disease)   . Gout   . Hypertension     Current Outpatient Medications on File Prior to Visit  Medication Sig Dispense Refill  . lisinopril (PRINIVIL,ZESTRIL) 40 MG tablet Take 1 tablet (40 mg total) by mouth daily. 30 tablet 0  . omeprazole (PRILOSEC) 20 MG capsule Take 1 capsule (20 mg total) by mouth 2 (two) times daily before a meal. 30 capsule 0  . tadalafil (ADCIRCA/CIALIS) 20 MG tablet TAKE 1 TABLET BY MOUTH ONCE DAILY AS NEEDED FOR ERECTILE DISFUNCTION 4 tablet 3   No current facility-administered medications on file prior to visit.     No Known Allergies  Family History  Problem Relation Age of Onset  . Hypertension Mother   . Diabetes Mother   . Hypertension Father     Social History   Socioeconomic History  . Marital status: Married    Spouse name: Not on file  . Number of children: Not on file  . Years of education: Not on file  .  Highest education level: Not on file  Occupational History  . Not on file  Social Needs  . Financial resource strain: Not on file  . Food insecurity:    Worry: Not on file    Inability: Not on file  . Transportation needs:    Medical: Not on file    Non-medical: Not on file  Tobacco Use  . Smoking status: Never Smoker  . Smokeless tobacco: Never Used  Substance and Sexual Activity  . Alcohol use: Yes    Comment: occasionally  . Drug use: No  . Sexual activity: Not on file  Lifestyle  . Physical activity:    Days per week: Not on file    Minutes per session: Not on file  . Stress: Not on file  Relationships  . Social connections:    Talks on phone: Not on file    Gets together: Not on file    Attends religious service: Not on file    Active member of club or organization: Not on file    Attends meetings of clubs or organizations: Not on file    Relationship status: Not on file  Other Topics Concern  . Not on file  Social History Narrative  . Not on file    Review of Systems - See HPI.  All other ROS are negative.  BP 122/90   Pulse 82   Temp  98.3 F (36.8 C) (Oral)   Resp 14   Ht 5\' 9"  (1.753 m)   Wt 204 lb (92.5 kg)   SpO2 99%   BMI 30.13 kg/m   Physical Exam  Constitutional: He appears well-developed and well-nourished.  HENT:  Head: Normocephalic and atraumatic.  Neck: Neck supple.  Cardiovascular: Normal rate, regular rhythm and normal heart sounds.  Pulmonary/Chest: Effort normal and breath sounds normal.  Musculoskeletal:       Feet:  Psychiatric: He has a normal mood and affect.  Vitals reviewed.  Assessment/Plan: Essential hypertension, benign Asymptomatic. Stable. Continue working on [employer] and exercise. Labs today.  GERD (gastroesophageal reflux disease) GERD diet reviewed. Will hold off on Prilosec and attempt trial back on Nexium. Close follow-up scheduled.   Chronic gout involving toe of left foot without tophus Restart  Allopurinol 300 mg daily. Uric acid level today. Giving potential start of a flare, will give 7 days of colchicine BID as well. Follow-up discussed.     Piedad Climes, PA-C

## 2018-02-15 NOTE — Assessment & Plan Note (Signed)
Asymptomatic. Stable. Continue working on [employer] and exercise. Labs today.

## 2018-02-16 ENCOUNTER — Encounter: Payer: Self-pay | Admitting: *Deleted

## 2018-02-16 ENCOUNTER — Telehealth: Payer: Self-pay | Admitting: Physician Assistant

## 2018-02-16 LAB — COMPREHENSIVE METABOLIC PANEL
ALBUMIN: 4.1 g/dL (ref 3.5–5.2)
ALK PHOS: 63 U/L (ref 39–117)
ALT: 15 U/L (ref 0–53)
AST: 15 U/L (ref 0–37)
BUN: 11 mg/dL (ref 6–23)
CO2: 26 mEq/L (ref 19–32)
CREATININE: 1.15 mg/dL (ref 0.40–1.50)
Calcium: 8.6 mg/dL (ref 8.4–10.5)
Chloride: 105 mEq/L (ref 96–112)
GFR: 88.69 mL/min (ref >60.00)
Glucose, Bld: 84 mg/dL (ref 70–99)
Potassium: 3.6 mEq/L (ref 3.5–5.1)
SODIUM: 139 meq/L (ref 135–145)
TOTAL PROTEIN: 6.9 g/dL (ref 6.0–8.3)
Total Bilirubin: 0.4 mg/dL (ref 0.2–1.2)

## 2018-02-16 LAB — URIC ACID: URIC ACID, SERUM: 5.9 mg/dL (ref 4.0–7.8)

## 2018-02-16 NOTE — Telephone Encounter (Signed)
Paperwork has been completed and placed in bin in clinic for front desk to fax.

## 2018-02-16 NOTE — Telephone Encounter (Signed)
Copied from CRM [id number]. Topic: General - Inquiry >> Feb 16, 2018  9:49 AM Maia Petties wrote: Reason for CRM: pt states he was not able to pick up gout medication or reflux medication until he gets paid this afternoon. He was trying to go to work today but his foot is hurting really bad. He is having his mother get his check and go to the pharmacy for him later. He is asking for a note to take time out of work since it takes a couple days for the medicine to work. Please call to advise on a note and how long to be out of work. Pt reiterated he will start medicine this evening.

## 2018-02-16 NOTE — Telephone Encounter (Signed)
Pt called in to provide office with work fax # to send in work note.  Fax: [phone]

## 2018-02-16 NOTE — Telephone Encounter (Signed)
Letter has been printed. Patient is aware. He has asked that I fax the note to his job. He is going to call back with a fax number for the letter to be sent.

## 2018-02-16 NOTE — Telephone Encounter (Signed)
Ok to give him a note for today and tomorrow.

## 2018-02-17 NOTE — Telephone Encounter (Signed)
LM for patient to call. Need to see if this letter needs to be sent to the attention of anyone specific.      If patient calls, okay for [facility] to ask and send note back to me to let me know.

## 2018-02-17 NOTE — Telephone Encounter (Signed)
Picked up from the back and faxed to [phone]

## 2018-02-18 ENCOUNTER — Ambulatory Visit: Payer: Self-pay | Admitting: *Deleted

## 2018-02-18 ENCOUNTER — Telehealth: Payer: Self-pay | Admitting: Physician Assistant

## 2018-02-18 ENCOUNTER — Encounter: Payer: Self-pay | Admitting: Physician Assistant

## 2018-02-18 ENCOUNTER — Other Ambulatory Visit: Payer: Self-pay

## 2018-02-18 ENCOUNTER — Encounter: Payer: Self-pay | Admitting: Emergency Medicine

## 2018-02-18 ENCOUNTER — Ambulatory Visit: Payer: BLUE CROSS/BLUE SHIELD | Admitting: Physician Assistant

## 2018-02-18 VITALS — BP 118/88 | HR 79 | Temp 98.2°F | Resp 14 | Ht 69.0 in | Wt 204.0 lb

## 2018-02-18 DIAGNOSIS — R42 Dizziness and giddiness: Secondary | ICD-10-CM

## 2018-02-18 LAB — CBC WITH DIFFERENTIAL/PLATELET
BASOS ABS: 0 10*3/uL (ref 0.0–0.1)
BASOS PCT: 0.4 % (ref 0.0–3.0)
EOS PCT: 0.8 % (ref 0.0–5.0)
Eosinophils Absolute: 0 10*3/uL (ref 0.0–0.7)
HEMATOCRIT: 42.2 % (ref 39.0–52.0)
HEMOGLOBIN: 14.9 g/dL (ref 13.0–17.0)
LYMPHS PCT: 33 % (ref 12.0–46.0)
Lymphs Abs: 1.3 10*3/uL (ref 0.7–4.0)
MCHC: 35.2 g/dL (ref 30.0–36.0)
MCV: 93.3 fl (ref 78.0–100.0)
MONOS PCT: 10 % (ref 3.0–12.0)
Monocytes Absolute: 0.4 10*3/uL (ref 0.1–1.0)
Neutro Abs: 2.2 10*3/uL (ref 1.4–7.7)
Neutrophils Relative %: 55.8 % (ref 43.0–77.0)
Platelets: 294 10*3/uL (ref 150.0–400.0)
RBC: 4.52 Mil/uL (ref 4.22–5.81)
RDW: 13 % (ref 11.5–15.5)
WBC: 3.9 10*3/uL — ABNORMAL LOW (ref 4.0–10.5)

## 2018-02-18 NOTE — Telephone Encounter (Signed)
Letter was faxed to the number provided by the patient.  He is coming in for an appointment today, new letter can be done at the discretion of the provider.

## 2018-02-18 NOTE — Progress Notes (Signed)
Patient presents to clinic today c/o episodic lightheadedness over the past 1.5 days associated with some nausea and abdominal cramping. Denies racing heart, SOB, chest pain, vertigo. Notes some loose stool. Recently placed on colchicine for an acute gout flare and noted symptoms started after this.  Past Medical History:  Diagnosis Date  . Chronic neck pain   . GERD (gastroesophageal reflux disease)   . Gout   . Hypertension     Current Outpatient Medications on File Prior to Visit  Medication Sig Dispense Refill  . allopurinol (ZYLOPRIM) 300 MG tablet Take 1 tablet (300 mg total) by mouth daily. 30 tablet 3  . colchicine 0.6 MG tablet Take 1 tablet (0.6 mg total) by mouth 2 (two) times daily. 14 tablet 0  . esomeprazole (NEXIUM) 20 MG packet Take 20 mg by mouth daily before breakfast. 30 each 12  . lisinopril (PRINIVIL,ZESTRIL) 40 MG tablet Take 1 tablet (40 mg total) by mouth daily. 30 tablet 0  . omeprazole (PRILOSEC) 20 MG capsule Take 1 capsule (20 mg total) by mouth 2 (two) times daily before a meal. 30 capsule 0  . tadalafil (ADCIRCA/CIALIS) 20 MG tablet TAKE 1 TABLET BY MOUTH ONCE DAILY AS NEEDED FOR ERECTILE DISFUNCTION 4 tablet 3   No current facility-administered medications on file prior to visit.     No Known Allergies  Family History  Problem Relation Age of Onset  . Hypertension Mother   . Diabetes Mother   . Hypertension Father     Social History   Socioeconomic History  . Marital status: Married    Spouse name: Not on file  . Number of children: Not on file  . Years of education: Not on file  . Highest education level: Not on file  Occupational History  . Not on file  Social Needs  . Financial resource strain: Not on file  . Food insecurity:    Worry: Not on file    Inability: Not on file  . Transportation needs:    Medical: Not on file    Non-medical: Not on file  Tobacco Use  . Smoking status: Never Smoker  . Smokeless tobacco: Never Used    Substance and Sexual Activity  . Alcohol use: Yes    Comment: occasionally  . Drug use: No  . Sexual activity: Not on file  Lifestyle  . Physical activity:    Days per week: Not on file    Minutes per session: Not on file  . Stress: Not on file  Relationships  . Social connections:    Talks on phone: Not on file    Gets together: Not on file    Attends religious service: Not on file    Active member of club or organization: Not on file    Attends meetings of clubs or organizations: Not on file    Relationship status: Not on file  Other Topics Concern  . Not on file  Social History Narrative  . Not on file   Review of Systems - See HPI.  All other ROS are negative.  BP 118/88   Pulse 79   Temp 98.2 F (36.8 C) (Oral)   Resp 14   Ht _0  (1.753 m)   Wt 204 lb (92.5 kg)   SpO2 99%   BMI 30.13 kg/m   Physical Exam  Constitutional: He is oriented to person, place, and time. He appears well-developed and well-nourished.  HENT:  Head: Normocephalic and atraumatic.  Right Ear: External  ear normal.  Left Ear: External ear normal.  Nose: Nose normal.  Mouth/Throat: Oropharynx is clear and moist.  Eyes: Pupils are equal, round, and reactive to light. Conjunctivae and EOM are normal.  Neck: Neck supple.  Cardiovascular: Normal rate, regular rhythm, normal heart sounds and intact distal pulses.  Pulmonary/Chest: Effort normal and breath sounds normal.  Lymphadenopathy:    He has no cervical adenopathy.  Neurological: He is alert and oriented to person, place, and time. No cranial nerve deficit.  Psychiatric: He has a normal mood and affect.  Vitals reviewed.  Recent Results (from the past 2160 hour(s))  Comp Met (CMET)     Status: None   Collection Time: 02/15/18  4:22 PM  Result Value Ref Range   Sodium 139 135 - 145 mEq/L   Potassium 3.6 3.5 - 5.1 mEq/L   Chloride 105 96 - 112 mEq/L   CO2 26 19 - 32 mEq/L   Glucose, Bld 84 70 - 99 mg/dL   BUN 11 6 - 23 mg/dL    Creatinine, Ser 1.15 0.40 - 1.50 mg/dL   Total Bilirubin 0.4 0.2 - 1.2 mg/dL   Alkaline Phosphatase 63 39 - 117 U/L   AST 15 0 - 37 U/L   ALT 15 0 - 53 U/L   Total Protein 6.9 6.0 - 8.3 g/dL   Albumin 4.1 3.5 - 5.2 g/dL   Calcium 8.6 8.4 - 10.5 mg/dL   GFR 88.69 >60.00 mL/min  Uric acid     Status: None   Collection Time: 02/15/18  4:22 PM  Result Value Ref Range   Uric Acid, Serum 5.9 4.0 - 7.8 mg/dL    Assessment/Plan: 1. Lightheadedness Noted along with some stomach cramping and loose stools. Vitals, OVS and examination unremarkable. Concern symptoms are side effect from colchicine. Will stop as gout symptoms have resolved. Increase fluids. Start Gatorade. Rest. Strict return precautions reviewed with patient. Will check CBC today to make sure no anemia present.  - CBC w/Diff   Leeanne Rio, PA-C

## 2018-02-18 NOTE — Telephone Encounter (Signed)
Please fax return to work note to [phone].

## 2018-02-18 NOTE — Telephone Encounter (Signed)
Note written and faxed 

## 2018-02-18 NOTE — Patient Instructions (Signed)
Vitals and examination look good today. I want you to stop the colchicine as I think it is contributing to your constellation of symptoms.  Continue the allopurinol for now.  Tylenol for any residual pain.  Increase fluid intake and start a daily Gatorade.  Please go to the lab today for blood work.  I will call you with your results. We will alter treatment regimen(s) if indicated by your results.   I am extending you absence through the weekend.  I have printed a letter for you.

## 2018-02-18 NOTE — Telephone Encounter (Signed)
Please advise of return back to work note for patient. Will fax to number provided

## 2018-02-18 NOTE — Telephone Encounter (Signed)
Ok to write through Sunday, returning on Monday

## 2018-02-18 NOTE — Telephone Encounter (Signed)
Pt called with complaints of feeling light headed, drained, and week; the pt states he was seen 02/15/18; he states that he started feeling lightheaded on 02/17/18 and has worsened today 02/18/18; recommendations made per nurse triage protocol; pt offered and accepted appointment with Malva Cogan, [facility], 02/18/18 at 1145; he verbalized understanding; will route to office for notification of this upcoming appointment.  Reason for Disposition . [1] MODERATE dizziness (e.g., interferes with normal activities) AND [2] has NOT been evaluated by physician for this  (Exception: dizziness caused by heat exposure, sudden standing, or poor fluid intake)  Answer Assessment - Initial Assessment Questions 1. DESCRIPTION: "Describe your dizziness."     light 2. LIGHTHEADED: "Do you feel lightheaded?" (e.g., somewhat faint, woozy, weak upon standing)     Lightheaded all the time; feels weak 3. VERTIGO: "Do you feel like either you or the room is spinning or tilting?" (i.e. vertigo)     no 4. SEVERITY: "How bad is it?"  "Do you feel like you are going to faint?" "Can you stand and walk?"   - MILD - walking normally   - MODERATE - interferes with normal activities (e.g., work, school)    - SEVERE - unable to stand, requires support to walk, feels like passing out now.      moderate 5. ONSET:  "When did the dizziness begin?"     02/17/18 6. AGGRAVATING FACTORS: "Does anything make it worse?" (e.g., standing, change in head position)     no 7. HEART RATE: "Can you tell me your heart rate?" "How many beats in 15 seconds?"  (Note: not all patients can do this)       Pt not able to complete this task 8. CAUSE: "What do you think is causing the dizziness?"     Not sure 9. RECURRENT SYMPTOM: "Have you had dizziness before?" If so, ask: "When was the last time?" "What happened that time?"     no 10. OTHER SYMPTOMS: "Do you have any other symptoms?" (e.g., fever, chest pain, vomiting, diarrhea, bleeding)       Weakness, no energy 11. PREGNANCY: "Is there any chance you are pregnant?" "When was your last menstrual period?"       n/a  Protocols used: [facility]

## 2018-02-22 ENCOUNTER — Ambulatory Visit: Payer: Self-pay

## 2018-02-22 ENCOUNTER — Encounter: Payer: Self-pay | Admitting: Emergency Medicine

## 2018-02-22 ENCOUNTER — Telehealth: Payer: Self-pay | Admitting: Emergency Medicine

## 2018-02-22 NOTE — Telephone Encounter (Signed)
pt called with complaints of vomiting on Sat 02/20/18 and Mon 02/22/18; the pt takes esomeprazole (NEXIUM) 20 MG packet, he has taken this in the past, and it worked well. He restarted over the weekend and he is having vomiting. He last vomited 1 hour ago, and took pepto bismul which helped the nausea; he also had emesis on 02/21/18 but not as much; recommendations made per nurse triage protocol; he normally sees [place], [facility]; the pt states that he will call back when he verifies that he can get a ride; will route to office for notification of this encounter.  Reason for Disposition . [1] MILD or MODERATE vomiting AND [2] present > 48 hours (2 days) (Exception: mild vomiting with associated diarrhea)  Answer Assessment - Initial Assessment Questions 1. VOMITING SEVERITY: "How many times have you vomited in the past 24 hours?"     - MILD:  1 - 2 times/day    - MODERATE: 3 - 5 times/day, decreased oral intake without significant weight loss or symptoms of dehydration    - SEVERE: 6 or more times/day, vomits everything or nearly everything, with significant weight loss, symptoms of dehydration      2 2. ONSET: "When did the vomiting begin?"      02/20/18 3. FLUIDS: "What fluids or food have you vomited up today?" "Have you been able to keep any fluids down?"     Salad from 02/21/18; had ginger ale after emesis 02/22/18 4. ABDOMINAL PAIN: "Are your having any abdominal pain?" If yes : "How bad is it and what does it feel like?" (e.g., crampy, dull, intermittent, constant)      Abdominal muscles tight 5. DIARRHEA: "Is there any diarrhea?" If so, ask: "How many times today?"      no 6. CONTACTS: "Is there anyone else in the family with the same symptoms?"      no 7. CAUSE: "What do you think is causing your vomiting?"     Acid reflux 8. HYDRATION STATUS: "Any signs of dehydration?" (e.g., dry mouth [not only dry lips], too weak to stand) "When did you last urinate?"     No; last urinated  02/22/18 at 0800 9. OTHER SYMPTOMS: "Do you have any other symptoms?" (e.g., fever, headache, vertigo, vomiting blood or coffee grounds, recent head injury)     no 10. PREGNANCY: "Is there any chance you are pregnant?" "When was your last menstrual period?"       n/a  Protocols used: [facility]

## 2018-02-22 NOTE — Telephone Encounter (Signed)
In reviewing patient chart and discussing with PCP about nurse triage. Patient states he has not vomited since this morning. Drinking ginger ale. Advised PCP ok for work note for missed work today. Encouraged patient needs to be seen for ongoing symptoms   Copied from CRM [id number]. Topic: General - Inquiry >> Feb 22, 2018  4:10 PM Baldo Daub L wrote: Reason for CRM:   Pt states that he has been sick with acid reflux and vomiting today and he wants to make sure a work note was faxed [phone] fax) for him to miss today for FMLA. Pt can be reached at [phone].

## 2018-02-22 NOTE — Telephone Encounter (Signed)
Patient is normally seen by Malva Cogan at [facility], not [facility].   Patient will have to determine if he has a ride before he can be scheduled. Routing to PCP as Lorain Childes

## 2018-02-22 NOTE — Telephone Encounter (Signed)
Just now getting this. He needs to be seen at latest tomorrow morning if symptoms are ongoing. Should consider Urgent Care this evening if able to get ride. Ok to send work note for today. Will not be able to extend further without reassessment of patient.

## 2018-02-23 NOTE — Telephone Encounter (Signed)
Please advise 

## 2018-02-23 NOTE — Telephone Encounter (Signed)
No extension of note without assessment as discussed yesterday when he declined appt.

## 2018-02-23 NOTE — Telephone Encounter (Signed)
Pt called in and stated that he still can not make it to work today.  He is still feeing like he is going to vomit and he is not able to go in.  He stated he is going to try to go in tomorrow  He needs note faxed to [phone] For today

## 2018-02-25 ENCOUNTER — Ambulatory Visit (INDEPENDENT_AMBULATORY_CARE_PROVIDER_SITE_OTHER): Payer: BLUE CROSS/BLUE SHIELD | Admitting: Physician Assistant

## 2018-02-25 ENCOUNTER — Other Ambulatory Visit: Payer: Self-pay

## 2018-02-25 ENCOUNTER — Encounter: Payer: Self-pay | Admitting: Physician Assistant

## 2018-02-25 VITALS — BP 112/82 | HR 80 | Temp 97.9°F | Resp 14 | Ht 69.0 in | Wt 204.0 lb

## 2018-02-25 DIAGNOSIS — K2990 Gastroduodenitis, unspecified, without bleeding: Secondary | ICD-10-CM | POA: Diagnosis not present

## 2018-02-25 MED ORDER — PANTOPRAZOLE SODIUM 40 MG PO TBEC: 40.0000 mg | DELAYED_RELEASE_TABLET | Freq: Every day | ORAL | 3 refills | Status: DC

## 2018-02-25 MED ORDER — ONDANSETRON HCL 4 MG PO TABS: 4.0000 mg | ORAL_TABLET | Freq: Three times a day (TID) | ORAL | 0 refills | Status: DC | PRN

## 2018-02-25 MED ORDER — SUCRALFATE 1 G PO TABS: 1.0000 g | ORAL_TABLET | Freq: Three times a day (TID) | ORAL | 0 refills | Status: DC

## 2018-02-25 NOTE — Progress Notes (Signed)
Patient presents to clinic today c/o worsening reflux now with nausea and vomiting. Notes some epigastric pain. Symptoms initially flared after use of colchicine for acute gout attack but have worsened despite use of OTC Nexium. Denies diarrhea, melena or hematochezia. Emetic episode was non-bloody. Denies fever, chills. Is causing him to miss work. .   Past Medical History:  Diagnosis Date  . Chronic neck pain   . GERD (gastroesophageal reflux disease)   . Gout   . Hypertension     Current Outpatient Medications on File Prior to Visit  Medication Sig Dispense Refill  . esomeprazole (NEXIUM) 20 MG packet Take 20 mg by mouth daily before breakfast. 30 each 12  . lisinopril (PRINIVIL,ZESTRIL) 40 MG tablet Take 1 tablet (40 mg total) by mouth daily. 30 tablet 0  . tadalafil (ADCIRCA/CIALIS) 20 MG tablet TAKE 1 TABLET BY MOUTH ONCE DAILY AS NEEDED FOR ERECTILE DISFUNCTION 4 tablet 3  . allopurinol (ZYLOPRIM) 300 MG tablet Take 1 tablet (300 mg total) by mouth daily. (Patient not taking: Reported on 02/25/2018) 30 tablet 3   No current facility-administered medications on file prior to visit.     No Known Allergies  Family History  Problem Relation Age of Onset  . Hypertension Mother   . Diabetes Mother   . Hypertension Father     Social History   Socioeconomic History  . Marital status: Married    Spouse name: Not on file  . Number of children: Not on file  . Years of education: Not on file  . Highest education level: Not on file  Occupational History  . Not on file  Social Needs  . Financial resource strain: Not on file  . Food insecurity:    Worry: Not on file    Inability: Not on file  . Transportation needs:    Medical: Not on file    Non-medical: Not on file  Tobacco Use  . Smoking status: Never Smoker  . Smokeless tobacco: Never Used  Substance and Sexual Activity  . Alcohol use: Yes    Comment: occasionally  . Drug use: No  . Sexual activity: Not on file    Lifestyle  . Physical activity:    Days per week: Not on file    Minutes per session: Not on file  . Stress: Not on file  Relationships  . Social connections:    Talks on phone: Not on file    Gets together: Not on file    Attends religious service: Not on file    Active member of club or organization: Not on file    Attends meetings of clubs or organizations: Not on file    Relationship status: Not on file  Other Topics Concern  . Not on file  Social History Narrative  . Not on file   Review of Systems - See HPI.  All other ROS are negative.  BP 112/82   Pulse 80   Temp 97.9 F (36.6 C) (Oral)   Resp 14   Ht 5' 9"  (1.753 m)   Wt 204 lb (92.5 kg)   SpO2 99%   BMI 30.13 kg/m   Physical Exam  Constitutional: He is oriented to person, place, and time. He appears well-developed and well-nourished.  HENT:  Head: Normocephalic and atraumatic.  Eyes: Conjunctivae are normal.  Neck: Neck supple.  Cardiovascular: Normal rate, regular rhythm, normal heart sounds and intact distal pulses.  Pulmonary/Chest: Effort normal and breath sounds normal. No stridor. No respiratory  distress. He has no wheezes. He has no rales. He exhibits no tenderness.  Abdominal: Soft. Bowel sounds are normal. There is no hepatosplenomegaly. There is tenderness in the left upper quadrant. There is no rebound, no guarding and no CVA tenderness. No hernia.  Neurological: He is alert and oriented to person, place, and time.  Psychiatric: He has a normal mood and affect.  Vitals reviewed.  Recent Results (from the past 2160 hour(s))  Comp Met (CMET)     Status: None   Collection Time: 02/15/18  4:22 PM  Result Value Ref Range   Sodium 139 135 - 145 mEq/L   Potassium 3.6 3.5 - 5.1 mEq/L   Chloride 105 96 - 112 mEq/L   CO2 26 19 - 32 mEq/L   Glucose, Bld 84 70 - 99 mg/dL   BUN 11 6 - 23 mg/dL   Creatinine, Ser 1.15 0.40 - 1.50 mg/dL   Total Bilirubin 0.4 0.2 - 1.2 mg/dL   Alkaline Phosphatase 63 39 -  117 U/L   AST 15 0 - 37 U/L   ALT 15 0 - 53 U/L   Total Protein 6.9 6.0 - 8.3 g/dL   Albumin 4.1 3.5 - 5.2 g/dL   Calcium 8.6 8.4 - 10.5 mg/dL   GFR 88.69 >60.00 mL/min  Uric acid     Status: None   Collection Time: 02/15/18  4:22 PM  Result Value Ref Range   Uric Acid, Serum 5.9 4.0 - 7.8 mg/dL  CBC w/Diff     Status: Abnormal   Collection Time: 02/18/18 11:54 AM  Result Value Ref Range   WBC 3.9 (L) 4.0 - 10.5 K/uL   RBC 4.52 4.22 - 5.81 Mil/uL   Hemoglobin 14.9 13.0 - 17.0 g/dL   HCT 42.2 39.0 - 52.0 %   MCV 93.3 78.0 - 100.0 fl   MCHC 35.2 30.0 - 36.0 g/dL   RDW 13.0 11.5 - 15.5 %   Platelets 294.0 150.0 - 400.0 K/uL   Neutrophils Relative % 55.8 43.0 - 77.0 %   Lymphocytes Relative 33.0 12.0 - 46.0 %   Monocytes Relative 10.0 3.0 - 12.0 %   Eosinophils Relative 0.8 0.0 - 5.0 %   Basophils Relative 0.4 0.0 - 3.0 %   Neutro Abs 2.2 1.4 - 7.7 K/uL   Lymphs Abs 1.3 0.7 - 4.0 K/uL   Monocytes Absolute 0.4 0.1 - 1.0 K/uL   Eosinophils Absolute 0.0 0.0 - 0.7 K/uL   Basophils Absolute 0.0 0.0 - 0.1 K/uL    Assessment/Plan: 1. Gastritis and duodenitis GI cocktail given in office with some improvement in symptoms. Stop OTC PPI. Start Pantoprazole 40 mg daily. Rx Carafate and Zofran. BRAT and GERD diet reviewed with patient. Close follow-up to be scheduled next week.   - ondansetron (ZOFRAN) 4 MG tablet; Take 1 tablet (4 mg total) by mouth every 8 (eight) hours as needed for nausea or vomiting.  Dispense: 20 tablet; Refill: 0 - pantoprazole (PROTONIX) 40 MG tablet; Take 1 tablet (40 mg total) by mouth daily.  Dispense: 30 tablet; Refill: 3 - sucralfate (CARAFATE) 1 g tablet; Take 1 tablet (1 g total) by mouth 4 (four) times daily -  with meals and at bedtime.  Dispense: 30 tablet; Refill: 0   Leeanne Rio, PA-C

## 2018-02-25 NOTE — Patient Instructions (Signed)
Please stop the OTC Nexium.  Start the Pantoprazole and Carafate that I have sent in as directed. The Zofran is to use as directed if needed for nausea. The nausea should improve significantly and hopefully resolve in the next couple of days.  Keep well-hydrated and get plenty of rest. Follow the dietary recommendations below.   Have your work send [place] FMLA to fill out to cover absences this week.  Ok to return to work tomorrow if feeling better but otherwise we will cover you through the weekend.    Food Choices for Gastroesophageal Reflux Disease, Adult When you have gastroesophageal reflux disease (GERD), the foods you eat and your eating habits are very important. Choosing the right foods can help ease your discomfort. What guidelines do I need to follow?  Choose fruits, vegetables, whole grains, and low-fat dairy products.  Choose low-fat meat, fish, and poultry.  Limit fats such as oils, salad dressings, butter, nuts, and avocado.  Keep a food diary. This helps you identify foods that cause symptoms.  Avoid foods that cause symptoms. These may be different for everyone.  Eat small meals often instead of 3 large meals a day.  Eat your meals slowly, in a place where you are relaxed.  Limit fried foods.  Cook foods using methods other than frying.  Avoid drinking alcohol.  Avoid drinking large amounts of liquids with your meals.  Avoid bending over or lying down until 2-3 hours after eating. What foods are not recommended? These are some foods and drinks that may make your symptoms worse: Vegetables Tomatoes. Tomato juice. Tomato and spaghetti sauce. Chili peppers. Onion and garlic. Horseradish. Fruits Oranges, grapefruit, and lemon (fruit and juice). Meats High-fat meats, fish, and poultry. This includes hot dogs, ribs, ham, sausage, salami, and bacon. Dairy Whole milk and chocolate milk. Sour cream. Cream. Butter. Ice cream. Cream cheese. Drinks Coffee and tea.  Bubbly (carbonated) drinks or energy drinks. Condiments Hot sauce. Barbecue sauce. Sweets/Desserts Chocolate and cocoa. Donuts. Peppermint and spearmint. Fats and Oils High-fat foods. This includes [place] fries and potato chips. Other Vinegar. Strong spices. This includes black pepper, white pepper, red pepper, cayenne, curry powder, cloves, ginger, and chili powder. The items listed above may not be a complete list of foods and drinks to avoid. Contact your dietitian for more information. This information is not intended to replace advice given to you by your health care provider. Make sure you discuss any questions you have with your health care provider. Document Released: 10/07/2011 Document Revised: 09/13/2015 Document Reviewed: 02/09/2013 Elsevier Interactive Patient Education  2017 [employer].

## 2018-03-02 ENCOUNTER — Telehealth: Payer: Self-pay | Admitting: Emergency Medicine

## 2018-03-02 NOTE — Telephone Encounter (Signed)
Copied from CRM [id number]. Topic: General - Inquiry >> Mar 02, 2018  1:14 PM Windy KalataMichael, Taylor L, NT wrote: Reason for CRM: patient is calling and states he returned back to work today and his FMLA caseworker is faxing over a short term disability form for dates 10/23-11/11 to be filled out.

## 2018-03-03 NOTE — Telephone Encounter (Signed)
Copied from CRM [id number]. Topic: General - Inquiry >> Mar 03, 2018 11:08 AM Windy KalataMichael, Taylor L, NT wrote: Reason for CRM: patient is calling in and states FMLA is sending a form that was first filled out and sent in when he received FMLA the first time. He states it needs to be filled out again.

## 2018-03-04 ENCOUNTER — Other Ambulatory Visit: Payer: Self-pay | Admitting: Physician Assistant

## 2018-03-04 NOTE — Telephone Encounter (Signed)
I see patient has schedule appointment for tomorrow. No paperwork received as of yet so I am assuming he is bringing with him.

## 2018-03-05 ENCOUNTER — Other Ambulatory Visit: Payer: Self-pay

## 2018-03-05 ENCOUNTER — Telehealth: Payer: Self-pay | Admitting: Physician Assistant

## 2018-03-05 ENCOUNTER — Encounter: Payer: Self-pay | Admitting: Physician Assistant

## 2018-03-05 ENCOUNTER — Ambulatory Visit: Payer: BLUE CROSS/BLUE SHIELD | Admitting: Physician Assistant

## 2018-03-05 VITALS — BP 122/84 | HR 84 | Temp 97.9°F | Resp 14 | Ht 69.0 in | Wt 202.0 lb

## 2018-03-05 DIAGNOSIS — R112 Nausea with vomiting, unspecified: Secondary | ICD-10-CM

## 2018-03-05 DIAGNOSIS — K2950 Unspecified chronic gastritis without bleeding: Secondary | ICD-10-CM

## 2018-03-05 MED ORDER — PROMETHAZINE HCL 25 MG PO TABS: 25.0000 mg | ORAL_TABLET | Freq: Three times a day (TID) | ORAL | 0 refills | Status: DC | PRN

## 2018-03-05 NOTE — Telephone Encounter (Signed)
Please review

## 2018-03-05 NOTE — Telephone Encounter (Signed)
Pt needs a worknote sent to [phone] for today and next week. He states that we will have to keep providing out of work notes until the FMLA/disability forms are sent in.

## 2018-03-05 NOTE — Progress Notes (Signed)
Acute Office Visit  Subjective:    Patient ID: John Simmons, male    DOB: [birth year], 42 y.o.   MRN: [id number]  Chief Complaint  Patient presents with  . Follow-up    HPI Patient is in today for follow-up of chronic gastritis. At last visit, patient was started on Carafate and Zofran in addition to Protonix. Has noted improvement in heart burn and indigestion. Denies melena, hematochezia or tenesmus. Is still noting an issue with persistent nausea and episodic vomiting. Denies hematemesis. Denies any known pattern to vomiting. Denies stress or anxiety. Notes emesis is non-bilious. Denies any new or worsening symptoms. Does not feel the Zofran is helping at all.  Past Medical History:  Diagnosis Date  . Chronic neck pain   . GERD (gastroesophageal reflux disease)   . Gout   . Hypertension     Past Surgical History:  Procedure Laterality Date  . HEMORRHOID BANDING  2016  . negative      Family History  Problem Relation Age of Onset  . Hypertension Mother   . Diabetes Mother   . Hypertension Father     Social History   Socioeconomic History  . Marital status: Married    Spouse name: Not on file  . Number of children: Not on file  . Years of education: Not on file  . Highest education level: Not on file  Occupational History  . Not on file  Social Needs  . Financial resource strain: Not on file  . Food insecurity:    Worry: Not on file    Inability: Not on file  . Transportation needs:    Medical: Not on file    Non-medical: Not on file  Tobacco Use  . Smoking status: Never Smoker  . Smokeless tobacco: Never Used  Substance and Sexual Activity  . Alcohol use: Yes    Comment: occasionally  . Drug use: No  . Sexual activity: Not on file  Lifestyle  . Physical activity:    Days per week: Not on file    Minutes per session: Not on file  . Stress: Not on file  Relationships  . Social connections:    Talks on phone: Not on file    Gets together: Not  on file    Attends religious service: Not on file    Active member of club or organization: Not on file    Attends meetings of clubs or organizations: Not on file    Relationship status: Not on file  . Intimate partner violence:    Fear of current or ex partner: Not on file    Emotionally abused: Not on file    Physically abused: Not on file    Forced sexual activity: Not on file  Other Topics Concern  . Not on file  Social History Narrative  . Not on file    Outpatient Medications Prior to Visit  Medication Sig Dispense Refill  . lisinopril (PRINIVIL,ZESTRIL) 40 MG tablet Take 1 tablet (40 mg total) by mouth daily. 30 tablet 0  . pantoprazole (PROTONIX) 40 MG tablet Take 1 tablet (40 mg total) by mouth daily. 30 tablet 3  . sucralfate (CARAFATE) 1 g tablet Take 1 tablet (1 g total) by mouth 4 (four) times daily -  with meals and at bedtime. 30 tablet 0  . tadalafil (ADCIRCA/CIALIS) 20 MG tablet TAKE 1 TABLET BY MOUTH ONCE DAILY AS NEEDED FOR ERECTILE DISFUNCTION 4 tablet 3  . esomeprazole (NEXIUM) 20 MG packet  Take 20 mg by mouth daily before breakfast. 30 each 12  . ondansetron (ZOFRAN) 4 MG tablet Take 1 tablet (4 mg total) by mouth every 8 (eight) hours as needed for nausea or vomiting. 20 tablet 0  . allopurinol (ZYLOPRIM) 300 MG tablet Take 1 tablet (300 mg total) by mouth daily. (Patient not taking: Reported on 03/05/2018) 30 tablet 3   No facility-administered medications prior to visit.     No Known Allergies  ROS Pertinent ROS are listed in the HPI.     Objective:    Physical Exam  Constitutional: He is oriented to person, place, and time. He appears well-developed and well-nourished.  HENT:  Head: Normocephalic and atraumatic.  Eyes: Conjunctivae are normal.  Neck: Neck supple.  Cardiovascular: Normal rate and regular rhythm.  Pulmonary/Chest: Effort normal and breath sounds normal. No respiratory distress. He has no wheezes. He has no rales. He exhibits no  tenderness.  Abdominal: Soft. Bowel sounds are normal. He exhibits no distension. There is no tenderness.  Lymphadenopathy:    He has no cervical adenopathy.  Neurological: He is alert and oriented to person, place, and time.  Psychiatric: He has a normal mood and affect.  Vitals reviewed.   BP 122/84   Pulse 84   Temp 97.9 F (36.6 C) (Oral)   Resp 14   Ht 5\' 9"  (1.753 m)   Wt 202 lb (91.6 kg)   SpO2 98%   BMI 29.83 kg/m  Wt Readings from Last 3 Encounters:  03/05/18 202 lb (91.6 kg)  02/25/18 204 lb (92.5 kg)  02/18/18 204 lb (92.5 kg)    There are no preventive care reminders to display for this patient.  There are no preventive care reminders to display for this patient.   Lab Results  Component Value Date   TSH 0.39 01/28/2017   Lab Results  Component Value Date   WBC 3.9 (L) 02/18/2018   HGB 14.9 02/18/2018   HCT 42.2 02/18/2018   MCV 93.3 02/18/2018   PLT 294.0 02/18/2018   Lab Results  Component Value Date   NA 139 02/15/2018   K 3.6 02/15/2018   CO2 26 02/15/2018   GLUCOSE 84 02/15/2018   BUN 11 02/15/2018   CREATININE 1.15 02/15/2018   BILITOT 0.4 02/15/2018   ALKPHOS 63 02/15/2018   AST 15 02/15/2018   ALT 15 02/15/2018   PROT 6.9 02/15/2018   ALBUMIN 4.1 02/15/2018   CALCIUM 8.6 02/15/2018   ANIONGAP 11 06/02/2017   GFR 88.69 02/15/2018   Lab Results  Component Value Date   CHOL 144 02/24/2017   Lab Results  Component Value Date   HDL 52.90 02/24/2017   Lab Results  Component Value Date   LDLCALC 78 02/24/2017   Lab Results  Component Value Date   TRIG 66.0 02/24/2017   Lab Results  Component Value Date   CHOLHDL 3 02/24/2017   Lab Results  Component Value Date   HGBA1C 5.8 12/21/2015       Assessment & Plan:   Problem List Items Addressed This Visit      Digestive   Chronic gastritis without bleeding - Primary    Continue PPI, Carafate. Start Phenergan. Referral placed to GI for further assessment and management  of current symptoms giving chronicity despite treatment. Will benefit from EGD.      Relevant Orders   Ambulatory referral to Gastroenterology   Non-intractable vomiting    Stop Zofran. Start Phenergan. Referral to GI placed for  further assessment of ongoing issue. Is hydrating very well which is good.      Relevant Orders   Ambulatory referral to Gastroenterology       Meds ordered this encounter  Medications  . promethazine (PHENERGAN) 25 MG tablet    Sig: Take 1 tablet (25 mg total) by mouth every 8 (eight) hours as needed for nausea or vomiting.    Dispense:  20 tablet    Refill:  0    Order Specific Question:   Supervising Provider    Answer:   Neena Rhymes E [[id number]]     Piedad Climes, PA-C

## 2018-03-05 NOTE — Telephone Encounter (Signed)
Work note written and faxed. 

## 2018-03-05 NOTE — Patient Instructions (Signed)
Please continue Protonix and Carafate.  Stop the Zofran and use the Phenergan as directed if needed for nausea. I am setting you up ASAP with Gastroenterology giving the severity and chronicity of symptoms.   We are contacting [employer] to get your paperwork faxed over.

## 2018-03-08 DIAGNOSIS — R1110 Vomiting, unspecified: Secondary | ICD-10-CM | POA: Insufficient documentation

## 2018-03-08 DIAGNOSIS — K2950 Unspecified chronic gastritis without bleeding: Secondary | ICD-10-CM | POA: Insufficient documentation

## 2018-03-08 NOTE — Assessment & Plan Note (Signed)
Stop Zofran. Start Phenergan. Referral to GI placed for further assessment of ongoing issue. Is hydrating very well which is good.

## 2018-03-08 NOTE — Assessment & Plan Note (Signed)
Continue PPI, Carafate. Start Phenergan. Referral placed to GI for further assessment and management of current symptoms giving chronicity despite treatment. Will benefit from EGD.

## 2018-03-09 ENCOUNTER — Telehealth: Payer: Self-pay | Admitting: Emergency Medicine

## 2018-03-09 ENCOUNTER — Encounter: Payer: Self-pay | Admitting: Gastroenterology

## 2018-03-09 ENCOUNTER — Telehealth: Payer: Self-pay | Admitting: Physician Assistant

## 2018-03-09 NOTE — Telephone Encounter (Signed)
Duplicate forms.

## 2018-03-09 NOTE — Telephone Encounter (Signed)
Copied from CRM #[id number]. [id number]: General - Other >> Mar 09, 2018  2:31 PM Mcneil, Ja-Kwan wrote: Reason for CRM: Pt states that he is having disability paperwork completed and he would like to make Malva CoganCody Martin aware that he will not be able to see the GI specialist until 04/02/18.

## 2018-03-09 NOTE — Telephone Encounter (Signed)
Can you see if GI can move patient appointment up sooner or schedule with another provider please

## 2018-03-09 NOTE — Telephone Encounter (Signed)
I can't write him out for another month. [facility] with [facility]. May need another provider.

## 2018-03-09 NOTE — Telephone Encounter (Signed)
I have placed forms from [employer] for you to complete and fax back in the bin up front. I have attached a charge sheet with the forms.

## 2018-03-12 ENCOUNTER — Ambulatory Visit: Payer: BLUE CROSS/BLUE SHIELD | Admitting: Physician Assistant

## 2018-03-15 ENCOUNTER — Other Ambulatory Visit: Payer: Self-pay | Admitting: Emergency Medicine

## 2018-03-15 ENCOUNTER — Other Ambulatory Visit: Payer: Self-pay | Admitting: Physician Assistant

## 2018-03-15 DIAGNOSIS — I10 Essential (primary) hypertension: Secondary | ICD-10-CM

## 2018-03-15 DIAGNOSIS — R112 Nausea with vomiting, unspecified: Secondary | ICD-10-CM

## 2018-03-15 MED ORDER — LISINOPRIL 40 MG PO TABS: 40.0000 mg | ORAL_TABLET | Freq: Every day | ORAL | 3 refills | Status: DC

## 2018-03-15 MED ORDER — PROMETHAZINE HCL 25 MG PO TABS: 25.0000 mg | ORAL_TABLET | Freq: Three times a day (TID) | ORAL | 0 refills | Status: DC | PRN

## 2018-03-15 NOTE — Telephone Encounter (Signed)
Copied from CRM [id number]. Topic: General - Other >> Mar 15, 2018 10:15 AM Leafy Roobinson, Norma J wrote: Reason for CRM:pt saw cody on 03-05-18 and needs a refill on lisinopril 40 mg. [facility] [address] in [place]

## 2018-03-17 ENCOUNTER — Telehealth: Payer: Self-pay | Admitting: Physician Assistant

## 2018-03-17 NOTE — Telephone Encounter (Signed)
Received attending physician's statement forms by fax, placed in bin up front w/charge sheet.

## 2018-03-17 NOTE — Telephone Encounter (Signed)
Pt requesting to be notifies when paperwork is faxed.  [phone]

## 2018-03-17 NOTE — Telephone Encounter (Signed)
Patient called to get the status of his FMLA paperwork.  Called office and they reported that they received a fax this morning.  Patient would like the dates to cover from 03/02/18 to 04/05/18.  Patient is scheduled to see a specialist for a procedure on 12/13.  Patient would like paperwork filled out as soon as possible so that he can get paid from his employer.  Please advise and call patient back with any questions.  CB# [phone]

## 2018-03-17 NOTE — Telephone Encounter (Signed)
Paperwork received. PCP has to complete and will fax back

## 2018-03-22 NOTE — Telephone Encounter (Signed)
Everything was completed and faxed back in first thing this morning.

## 2018-03-23 NOTE — Telephone Encounter (Signed)
Called [employer] to verify if they have received paperwork.

## 2018-03-23 NOTE — Telephone Encounter (Signed)
Please see all of the other messages regarding this. I am not sure why there needs to be multiple notes created for the same topic. This does nothing but cause issue and slow down response to the patient. The forms were completed again and everything was faxed yesterday as noted in the note sent yesterday. We received fax confirmation of receipt. [facility] as everything is done on our end.

## 2018-03-23 NOTE — Telephone Encounter (Signed)
Duplicate message.  See other message.

## 2018-03-23 NOTE — Telephone Encounter (Signed)
Patient calling to check the status is his short term disability paperwork from [employer]. Please advise. Was told that it would be done on Monday and he states that they do not have the paperwork. Needs this done asap. Please give the patient a call.

## 2018-03-23 NOTE — Telephone Encounter (Signed)
Pt following up to inquire if papers were faxed to disability. Pt states they are waiting for this information. Advised pt we just received on 11/27, and office was closed 11/28 & 11/29.  It can take 5-7 business days for paper turnaround

## 2018-03-30 ENCOUNTER — Telehealth: Payer: Self-pay | Admitting: Emergency Medicine

## 2018-03-30 NOTE — Telephone Encounter (Signed)
Will not be extending his FMLA. Will be at discretion of his GI provider once he sees him Friday. I wrote him out through the 16th I believe to give him time to see GI provider. I will not be extending further.

## 2018-03-30 NOTE — Telephone Encounter (Signed)
Copied from CRM [id number]. Topic: General - Other >> Mar 30, 2018 10:30 AM Floria RavelingStovall, Shana A wrote: Reason for CRM: pt called in and stated that the FMLA paperwork was faxed today from [employer] and he would like to know when it has been completed.  He stated he was going to be out of work til Jan the 6th 2020.  He is going to see GI on this Friday the 13th.    Pt would like them faxed back to the number that is showing on the paperwork

## 2018-03-31 NOTE — Telephone Encounter (Signed)
Paperwork was faxed to [employer].

## 2018-03-31 NOTE — Telephone Encounter (Signed)
Received paperwork. I have completed it with estimated RTW date of 04/06/18 just as on STD paperwork.  Again, further extension will have to come from his specialist.  Ok to fax.

## 2018-03-31 NOTE — Telephone Encounter (Signed)
Spoke with patient and he states the [employer] short term disability paperwork was completed . Patient states the FMLA paperwork from [employer] will be faxed. Advised we have not received it. PCP was not extending pass the 04/05/18.

## 2018-04-02 ENCOUNTER — Encounter: Payer: Self-pay | Admitting: Gastroenterology

## 2018-04-02 ENCOUNTER — Ambulatory Visit: Payer: BLUE CROSS/BLUE SHIELD | Admitting: Gastroenterology

## 2018-04-02 ENCOUNTER — Telehealth: Payer: Self-pay | Admitting: Physician Assistant

## 2018-04-02 NOTE — Telephone Encounter (Signed)
Copied from CRM [id number]. Topic: Quick Communication - See Telephone Encounter >> Apr 02, 2018  4:34 PM Jens SomMedley, Jennifer A wrote: CRM for notification. See Telephone encounter for: 04/02/18.  Patient is calling because he missed his appt with GI today because he was given the wrong time.  The patient was given another appt time 05/11/18. The patient is requesting assistance to see if the office can assist him with GI with an earlier appt since he is on short time disability. Please advise [phone]

## 2018-04-03 ENCOUNTER — Emergency Department (HOSPITAL_BASED_OUTPATIENT_CLINIC_OR_DEPARTMENT_OTHER)
Admission: EM | Admit: 2018-04-03 | Discharge: 2018-04-03 | Disposition: A | Discharge status: Home / Self Care | Payer: BLUE CROSS/BLUE SHIELD | Attending: Emergency Medicine | Admitting: Emergency Medicine

## 2018-04-03 ENCOUNTER — Encounter (HOSPITAL_BASED_OUTPATIENT_CLINIC_OR_DEPARTMENT_OTHER): Payer: Self-pay | Admitting: Emergency Medicine

## 2018-04-03 ENCOUNTER — Other Ambulatory Visit: Payer: Self-pay | Admitting: Medical

## 2018-04-03 ENCOUNTER — Other Ambulatory Visit: Payer: Self-pay

## 2018-04-03 DIAGNOSIS — I10 Essential (primary) hypertension: Secondary | ICD-10-CM | POA: Insufficient documentation

## 2018-04-03 DIAGNOSIS — R030 Elevated blood-pressure reading, without diagnosis of hypertension: Secondary | ICD-10-CM

## 2018-04-03 DIAGNOSIS — Z79899 Other long term (current) drug therapy: Secondary | ICD-10-CM | POA: Diagnosis not present

## 2018-04-03 DIAGNOSIS — R51 Headache: Secondary | ICD-10-CM | POA: Diagnosis not present

## 2018-04-03 DIAGNOSIS — F439 Reaction to severe stress, unspecified: Secondary | ICD-10-CM | POA: Diagnosis not present

## 2018-04-03 MED ORDER — HYDRALAZINE HCL 25 MG PO TABS
ORAL_TABLET | ORAL | Status: AC
  Administered 2018-04-03: 12.5 mg via ORAL
  Filled 2018-04-03: qty 1

## 2018-04-03 MED ORDER — HYDRALAZINE HCL 10 MG PO TABS
10.0000 mg | ORAL_TABLET | Freq: Once | ORAL | Status: AC
  Administered 2018-04-03: 12.5 mg via ORAL
  Filled 2018-04-03: qty 1

## 2018-04-03 NOTE — ED Triage Notes (Signed)
Pt reports high BP readings and mild headache x 2 days. He takes lisinopril for HTN and states he is compliant.

## 2018-04-03 NOTE — Telephone Encounter (Signed)
He will need to ask to be added to a cancellation list so that they can see him sooner. There is no more that I can do giving he missed his appointment. His RTW date is still scheduled for Tuesday. I will not be extending leave further.

## 2018-04-03 NOTE — ED Provider Notes (Signed)
[facility] EMERGENCY DEPARTMENT Provider Note   CSN: [id number] Arrival date & time: 04/03/18  1422     History   Chief Complaint Chief Complaint  Patient presents with  . Hypertension    HPI John Simmons is a 42 y.o. male.  HPI Patient states his blood pressures been elevated for the last few days.  States his systolic blood pressures been in the 150s and diastolic in the 100s.  He is very concerned about this.  His normal blood pressure is 120/80.  States he has been compliant with his lisinopril.  States he has a mild left frontal headache which was gradual in onset.  No visual changes.  He has been under increased stress as of late due to the potential for losing his job.  Is asking for some medication to help bring his blood pressure down.  He denies any neck pain, focal weakness or numbness.  No chest pain or shortness of breath. Past Medical History:  Diagnosis Date  . Chronic neck pain   . GERD (gastroesophageal reflux disease)   . Gout   . Hypertension     Patient Active Problem List   Diagnosis Date Noted  . Chronic gastritis without bleeding 03/08/2018  . Non-intractable vomiting 03/08/2018  . Chronic gout involving toe of left foot without tophus 02/15/2018  . Hyperlipidemia 05/05/2016  . Prostate cancer screening 12/21/2015  . Visit for preventive health examination 12/21/2015  . Anxiety and depression 02/27/2015  . Essential hypertension, benign 03/08/2014  . GERD (gastroesophageal reflux disease) 03/08/2014    Past Surgical History:  Procedure Laterality Date  . HEMORRHOID BANDING  2016  . negative          Home Medications    Prior to Admission medications   Medication Sig Start Date End Date Taking? Authorizing Provider  allopurinol (ZYLOPRIM) 300 MG tablet Take 1 tablet (300 mg total) by mouth daily. Patient not taking: Reported on 03/05/2018 02/15/18   Waldon MerlMartin, William C, PA-C  lisinopril (PRINIVIL,ZESTRIL) 40 MG tablet Take 1  tablet (40 mg total) by mouth daily. 03/15/18   Waldon MerlMartin, William C, PA-C  pantoprazole (PROTONIX) 40 MG tablet Take 1 tablet (40 mg total) by mouth daily. 02/25/18   Waldon MerlMartin, William C, PA-C  promethazine (PHENERGAN) 25 MG tablet Take 1 tablet (25 mg total) by mouth every 8 (eight) hours as needed for nausea or vomiting. 03/15/18   Waldon MerlMartin, William C, PA-C  sucralfate (CARAFATE) 1 g tablet Take 1 tablet (1 g total) by mouth 4 (four) times daily -  with meals and at bedtime. 02/25/18   Waldon MerlMartin, William C, PA-C  tadalafil (ADCIRCA/CIALIS) 20 MG tablet TAKE 1 TABLET BY MOUTH ONCE DAILY AS NEEDED FOR ERECTILE DISFUNCTION 03/05/18   Waldon MerlMartin, William C, PA-C    Family History Family History  Problem Relation Age of Onset  . Hypertension Mother   . Diabetes Mother   . Hypertension Father     Social History Social History   Tobacco Use  . Smoking status: Never Smoker  . Smokeless tobacco: Never Used  Substance Use Topics  . Alcohol use: Yes    Comment: occasionally  . Drug use: No     Allergies   Patient has no known allergies.   Review of Systems Review of Systems  Constitutional: Negative for chills and fever.  HENT: Negative for sinus pressure, sinus pain and trouble swallowing.   Eyes: Negative for photophobia and visual disturbance.  Respiratory: Negative for cough and shortness  of breath.   Cardiovascular: Negative for chest pain.  Gastrointestinal: Negative for abdominal pain, diarrhea, nausea and vomiting.  Genitourinary: Negative for dysuria and flank pain.  Musculoskeletal: Negative for back pain, myalgias, neck pain and neck stiffness.  Skin: Negative for rash and wound.  Neurological: Positive for headaches. Negative for dizziness, syncope, weakness, light-headedness and numbness.  Psychiatric/Behavioral: Negative for sleep disturbance. The patient is nervous/anxious.   All other systems reviewed and are negative.    Physical Exam Updated Vital Signs BP (!) 150/102 (BP  Location: Right Arm)   Pulse 83   Temp 98.3 F (36.8 C) (Oral)   Resp 15   Ht 5\' 9"  (1.753 m)   Wt 93 kg   SpO2 100%   BMI 30.27 kg/m   Physical Exam Vitals signs and nursing note reviewed.  Constitutional:      Appearance: Normal appearance. He is well-developed.  HENT:     Head: Normocephalic and atraumatic.     Comments: No sinus tenderness to percussion.  No temporal artery tenderness.    Nose: Nose normal.     Mouth/Throat:     Mouth: Mucous membranes are dry.  Eyes:     Extraocular Movements: Extraocular movements intact.     Conjunctiva/sclera: Conjunctivae normal.     Pupils: Pupils are equal, round, and reactive to light.  Neck:     Musculoskeletal: Normal range of motion and neck supple. No neck rigidity or muscular tenderness.  Cardiovascular:     Rate and Rhythm: Normal rate and regular rhythm.  Pulmonary:     Effort: Pulmonary effort is normal. No respiratory distress.     Breath sounds: Normal breath sounds. No stridor. No wheezing, rhonchi or rales.  Chest:     Chest wall: No tenderness.  Abdominal:     General: Bowel sounds are normal.     Palpations: Abdomen is soft.     Tenderness: There is no abdominal tenderness. There is no guarding or rebound.  Musculoskeletal: Normal range of motion.        General: No swelling, tenderness or deformity.     Right lower leg: No edema.     Left lower leg: No edema.     Comments: No midline thoracic or lumbar tenderness.  Distal pulses intact.  Lymphadenopathy:     Cervical: No cervical adenopathy.  Skin:    General: Skin is warm and dry.     Findings: No erythema or rash.  Neurological:     General: No focal deficit present.     Mental Status: He is alert and oriented to person, place, and time.     Comments: Moving all extremities without focal deficit.  Sensation intact.  Psychiatric:        Behavior: Behavior normal.      ED Treatments / Results  Labs (all labs ordered are listed, but only abnormal  results are displayed) Labs Reviewed - No data to display  EKG None  Radiology No results found.  Procedures Procedures (including critical care time)  Medications Ordered in ED Medications  hydrALAZINE (APRESOLINE) tablet 10 mg (12.5 mg Oral Given 04/03/18 1727)     Initial Impression / Assessment and Plan / ED Course  I have reviewed the triage vital signs and the nursing notes.  Pertinent labs & imaging results that were available during my care of the patient were reviewed by me and considered in my medical decision making (see chart for details).     Mildly elevated blood pressure.  Likely due to increased stress.  Long discussion with patient regarding treatment goals.  He is quite fixated on the fact the blood pressure is elevated.  Asking for medication to bring this down.  Will give very low-dose hydralazine.  Best to treat with stress management.  Advised to follow-up closely with his primary physician.  Final Clinical Impressions(s) / ED Diagnoses   Final diagnoses:  Transient elevated blood pressure  Stress    ED Discharge Orders    None       Loren Racer, MD 04/03/18 [id number]

## 2018-04-03 NOTE — ED Notes (Signed)
Patient states that he needs to leave because his mother is sick. MD yelverton made aware - patient states that he can not wait for the papers. And patient left

## 2018-04-07 ENCOUNTER — Telehealth: Payer: Self-pay | Admitting: Emergency Medicine

## 2018-04-07 ENCOUNTER — Encounter: Payer: Self-pay | Admitting: Physician Assistant

## 2018-04-07 ENCOUNTER — Inpatient Hospital Stay: Payer: BLUE CROSS/BLUE SHIELD | Admitting: Physician Assistant

## 2018-04-07 NOTE — Telephone Encounter (Signed)
Copied from CRM [id number]. Topic: General - Other >> Apr 07, 2018 11:12 AM Gerrianne ScalePayne, Angela L wrote: Reason for CRM: pt calling stating that his FMLA need to updated stating that his appt is on the 23rd at GI office just to extent fmla until the 23rd until his appt this need to be faxed to them today as soon as possible please call pt at [phone]

## 2018-04-07 NOTE — Telephone Encounter (Signed)
I will allow one-time extension to the 23rd. Will have to locate papers as they were already sent to scan. No further extensions will be given for any reason. Further FMLA to come from GI.

## 2018-04-07 NOTE — Telephone Encounter (Signed)
Please advise 

## 2018-04-08 NOTE — Telephone Encounter (Signed)
Pt is calling and he is aware PA out of office this afternoon . Please complete refax FMLA   with new date 23rd

## 2018-04-08 NOTE — Telephone Encounter (Signed)
Patient has an appointment tomorrow with PCP.

## 2018-04-09 ENCOUNTER — Ambulatory Visit: Payer: BLUE CROSS/BLUE SHIELD | Admitting: Physician Assistant

## 2018-04-09 DIAGNOSIS — Z0289 Encounter for other administrative examinations: Secondary | ICD-10-CM

## 2018-04-12 ENCOUNTER — Telehealth: Payer: Self-pay | Admitting: Physician Assistant

## 2018-04-12 ENCOUNTER — Encounter: Payer: Self-pay | Admitting: Physician Assistant

## 2018-04-12 ENCOUNTER — Ambulatory Visit (INDEPENDENT_AMBULATORY_CARE_PROVIDER_SITE_OTHER): Payer: BLUE CROSS/BLUE SHIELD | Admitting: Physician Assistant

## 2018-04-12 VITALS — BP 134/96 | HR 89 | Ht 69.0 in | Wt 210.5 lb

## 2018-04-12 DIAGNOSIS — K219 Gastro-esophageal reflux disease without esophagitis: Secondary | ICD-10-CM

## 2018-04-12 DIAGNOSIS — R1013 Epigastric pain: Secondary | ICD-10-CM

## 2018-04-12 MED ORDER — OMEPRAZOLE 40 MG PO CPDR: 40.0000 mg | DELAYED_RELEASE_CAPSULE | Freq: Two times a day (BID) | ORAL | 5 refills | Status: DC

## 2018-04-12 MED ORDER — FAMOTIDINE 40 MG PO TABS: 40.0000 mg | ORAL_TABLET | Freq: Two times a day (BID) | ORAL | 5 refills | Status: DC

## 2018-04-12 NOTE — Progress Notes (Signed)
Chief Complaint: Nausea and vomiting, GERD  HPI:    [name] is a 42 year old African-American male with a past medical history as listed below, who was referred to me by Waldon MerlMartin, William C, PA-C for a complaint of nausea and vomiting.      03/05/2018 office visit with PCP.  At that time discuss chronic gastritis.  Apparently he had been started on Carafate, Zofran and Protonix at last visit with improvement in heartburn and indigestion.  He did not feel Zofran was helping.  That time he is continued on his PPI and Carafate and started on Phenergan.  He was given a GI referral for possible EGD.    04/03/2018 ED visit for hypertension.  Described blood pressure 150/100 with a normal blood pressure around 120/80.  Describes increased stress as of late due to the potential for losing his job.  He was given low-dose Hydralazine and advised to follow with his PCP.    Today, patient presents clinic and explains for the past 3 months he has had such intense reflux that it causes him nausea and sometimes to vomit an acid-like material at least once a day.  Describes having reflux for years but in the past 3 months this has been worse.  This is accompanied by an epigastric pain described as a stab/burning/cramp rated as a 10/10 at its worst.  Tells me that he has had to fill out short-term disability papers and FMLA for his work because this was "disturbing his coworkers".  Patient describes occasional alcohol use, but does not drink caffeine, as well as occasional Ibuprofen use.  He does tell me that he has stopped all of his medications including Pantoprazole, Carafate and Phenergan because he "does not think they are working".  Nothing has changed with his medications over the past 3 months.  Associated symptoms include occasional diarrhea.    Denies fever, chills, weight loss, anorexia, blood in his stool or symptoms that awaken him from sleep.     Past Medical History:  Diagnosis Date  . Chronic neck  pain   . GERD (gastroesophageal reflux disease)   . Gout   . Hypertension     Past Surgical History:  Procedure Laterality Date  . HEMORRHOID BANDING  2016  . negative      Current Outpatient Medications  Medication Sig Dispense Refill  . allopurinol (ZYLOPRIM) 300 MG tablet Take 1 tablet (300 mg total) by mouth daily. (Patient not taking: Reported on 03/05/2018) 30 tablet 3  . amLODipine (NORVASC) 10 MG tablet TAKE 1 TABLET BY MOUTH ONCE DAILY 30 tablet 0  . lisinopril (PRINIVIL,ZESTRIL) 40 MG tablet Take 1 tablet (40 mg total) by mouth daily. 30 tablet 3  . pantoprazole (PROTONIX) 40 MG tablet Take 1 tablet (40 mg total) by mouth daily. 30 tablet 3  . promethazine (PHENERGAN) 25 MG tablet Take 1 tablet (25 mg total) by mouth every 8 (eight) hours as needed for nausea or vomiting. 20 tablet 0  . sucralfate (CARAFATE) 1 g tablet Take 1 tablet (1 g total) by mouth 4 (four) times daily -  with meals and at bedtime. 30 tablet 0  . tadalafil (ADCIRCA/CIALIS) 20 MG tablet TAKE 1 TABLET BY MOUTH ONCE DAILY AS NEEDED FOR ERECTILE DISFUNCTION 4 tablet 3   No current facility-administered medications for this visit.     Allergies as of 04/12/2018  . (No Known Allergies)    Family History  Problem Relation Age of Onset  . Hypertension Mother   .  Diabetes Mother   . Hypertension Father     Social History   Socioeconomic History  . Marital status: Married    Spouse name: Not on file  . Number of children: Not on file  . Years of education: Not on file  . Highest education level: Not on file  Occupational History  . Not on file  Social Needs  . Financial resource strain: Not on file  . Food insecurity:    Worry: Not on file    Inability: Not on file  . Transportation needs:    Medical: Not on file    Non-medical: Not on file  Tobacco Use  . Smoking status: Never Smoker  . Smokeless tobacco: Never Used  Substance and Sexual Activity  . Alcohol use: Yes    Comment:  occasionally  . Drug use: No  . Sexual activity: Not on file  Lifestyle  . Physical activity:    Days per week: Not on file    Minutes per session: Not on file  . Stress: Not on file  Relationships  . Social connections:    Talks on phone: Not on file    Gets together: Not on file    Attends religious service: Not on file    Active member of club or organization: Not on file    Attends meetings of clubs or organizations: Not on file    Relationship status: Not on file  . Intimate partner violence:    Fear of current or ex partner: Not on file    Emotionally abused: Not on file    Physically abused: Not on file    Forced sexual activity: Not on file  Other Topics Concern  . Not on file  Social History Narrative  . Not on file    Review of Systems:    Constitutional: No weight loss, fever or chills Skin: No rash  Cardiovascular: No chest pain Respiratory: No SOB  Gastrointestinal: See HPI and otherwise negative Genitourinary: No dysuria  Neurological: No headache, dizziness or syncope Musculoskeletal: No new muscle or joint pain Hematologic: No bleeding Psychiatric: No history of depression or anxiety   Physical Exam:  Vital signs: BP (!) 134/96   Pulse 89   Ht 5\' 9"  (1.753 m)   Wt 210 lb 8 oz (95.5 kg)   BMI 31.09 kg/m   Constitutional:  AA male appears to be in NAD, Well developed, Well nourished, alert and cooperative Head:  Normocephalic and atraumatic. Eyes:   PEERL, EOMI. No icterus. Conjunctiva pink. Ears:  Normal auditory acuity. Neck:  Supple Throat: Oral cavity and pharynx without inflammation, swelling or lesion.  Respiratory: Respirations even and unlabored. Lungs clear to auscultation bilaterally.   No wheezes, crackles, or rhonchi.  Cardiovascular: Normal S1, S2. No MRG. Regular rate and rhythm. No peripheral edema, cyanosis or pallor.  Gastrointestinal:  Soft, nondistended, mild epigastric ttp. No rebound or guarding. Normal bowel sounds. No  appreciable masses or hepatomegaly. Rectal:  Not performed.  Msk:  Symmetrical without gross deformities. Without edema, no deformity or joint abnormality.  Neurologic:  Alert and  oriented x4;  grossly normal neurologically.  Skin:   Dry and intact without significant lesions or rashes. Psychiatric:  Demonstrates good judgement and reason without abnormal affect or behaviors.  MOST RECENT LABS AND IMAGING: CBC    Component Value Date/Time   WBC 3.9 (L) 02/18/2018 1154   RBC 4.52 02/18/2018 1154   HGB 14.9 02/18/2018 1154   HCT 42.2 02/18/2018 1154  PLT 294.0 02/18/2018 1154   MCV 93.3 02/18/2018 1154   MCH 32.3 06/02/2017 1504   MCHC 35.2 02/18/2018 1154   RDW 13.0 02/18/2018 1154   LYMPHSABS 1.3 02/18/2018 1154   MONOABS 0.4 02/18/2018 1154   EOSABS 0.0 02/18/2018 1154   BASOSABS 0.0 02/18/2018 1154    CMP     Component Value Date/Time   NA 139 02/15/2018 1622   K 3.6 02/15/2018 1622   CL 105 02/15/2018 1622   CO2 26 02/15/2018 1622   GLUCOSE 84 02/15/2018 1622   BUN 11 02/15/2018 1622   CREATININE 1.15 02/15/2018 1622   CALCIUM 8.6 02/15/2018 1622   PROT 6.9 02/15/2018 1622   ALBUMIN 4.1 02/15/2018 1622   AST 15 02/15/2018 1622   ALT 15 02/15/2018 1622   ALKPHOS 63 02/15/2018 1622   BILITOT 0.4 02/15/2018 1622   GFRNONAA >60 06/02/2017 1504   GFRAA >60 06/02/2017 1504    Assessment: 1.  GERD: Uncontrolled, patient reports no relief with Pantoprazole 40 mg daily and Carafate 4 times daily as well as Zofran, recently stopped these within the past week, symptoms are disturbing him so much that he is now on short-term disability/FMLA; Consider H. pylori versus PUD versus other 2.  Epigastric pain: With above 3.  Dyspepsia: With above  Plan: 1.  Discussed with patient that he can send his FMLA paperwork to our outside source for this.  Explained though that I would not be approving this for long, just until time of his procedure to figure out what is going on.  After that, he would again need to get this from his PCP if still required. 2.  Patient was given multiple options for procedure times including this Friday, 04/16/2018 which he did not accept because he is "going out of town".  He originally picked a day in mid January, but I explained to him that I would not be approving his FMLA that long if we have procedure times open prior to that.  He finally agreed to do April 23, 2017.  We did go ahead and discuss risks, benefits, limitations and alternatives to his EGD which [facility]  Patient agrees to proceed. 3.  Prescribed Omeprazole 40 mg twice daily, 30-60 minutes before breakfast and dinner #60 with 3 refills 4.  Prescribed Pepcid 40 mg twice daily, every morning and nightly #60 with 3 refills 5.  Patient to follow in clinic per recommendations from Dr. Adela LankArmbruster after time of procedure.  Hyacinth MeekerJennifer Lemmon, PA-C [facility] 04/12/2018, 11:08 AM  Cc: Waldon MerlMartin, William C, PA-C

## 2018-04-12 NOTE — Progress Notes (Signed)
Agree with assessment and plan as outlined.  

## 2018-04-12 NOTE — Patient Instructions (Addendum)
If you are age 44 or older, your body mass index should be between 23-30. Your Body mass index is 31.09 kg/m. If this is out of the aforementioned range listed, please consider follow up with your Primary Care Provider.  If you are age 44 or younger, your body mass index should be between 19-25. Your Body mass index is 31.09 kg/m. If this is out of the aformentioned range listed, please consider follow up with your Primary Care Provider.   You have been scheduled for an endoscopy. Please follow written instructions given to you at your visit today. If you use inhalers (even only as needed), please bring them with you on the day of your procedure. Your physician has requested that you go to www.startemmi.com and enter the access code given to you at your visit today. This web site gives a general overview about your procedure. However, you should still follow specific instructions given to you by our office regarding your preparation for the procedure.  We have sent the following medications to your pharmacy for you to pick up at your convenience: Omeprazole Pepcid  You have been given FMLA information.  Thank you for choosing me and [facility].    Hyacinth MeekerJennifer Lemmon, PA-C

## 2018-04-12 NOTE — Telephone Encounter (Signed)
Copied from CRM [id number]. Topic: Quick Communication - See Telephone Encounter >> Apr 12, 2018 12:03 PM Jens SomMedley, Jennifer A wrote: CRM for notification. See Telephone encounter for: 04/12/18.  Patient is calling because his FMLA paperwork was not received. Patient states that his appt on 12//20/19 was canceled because he got in with the GI doctor early (Today 04/12/18). At this time, the patient is asking if Selena BattenCody can send the paper work to the [employer] company to extend the [employer]ML[employer] Patient call back [phone]

## 2018-04-12 NOTE — Telephone Encounter (Signed)
Called and advised pt that FMLA paperwork was faxed. Pt stated an understanding.

## 2018-04-13 ENCOUNTER — Encounter: Payer: Self-pay | Admitting: Gastroenterology

## 2018-04-16 NOTE — Telephone Encounter (Signed)
° ° ° ° °  Pt said he spoke with FMLA today  and they said they did not received the paperwork that was sent over on 04/12/18. Please resend

## 2018-04-20 NOTE — Telephone Encounter (Signed)
Carlen is calling from [employer], HR Department, stating they received the [employer] MedicalFMLA paperwork but did not receive the last page with [facility] signature and is needing that refaxed.   Fax# [phone]  Also states the patient is having surgery 04/23/18 and she is wanting to know the current information the patient is written out of work for as the latest she has is 04/05/18. Please advise.   CB# [phone]

## 2018-04-22 NOTE — Telephone Encounter (Signed)
Re-faxed paperwork to the [facility] fax number provided. Also sent office notes from the ER patient was seen on 04/03/18. Patient missed his appointment with PCP for follow up. Patient missed appointment with GI but has been rescheduled and seen. Pending surgery. Sent over GI office notes as well. GI can extend out of work.

## 2018-04-23 ENCOUNTER — Encounter: Payer: BLUE CROSS/BLUE SHIELD | Admitting: Gastroenterology

## 2018-04-26 ENCOUNTER — Ambulatory Visit (AMBULATORY_SURGERY_CENTER): Payer: BLUE CROSS/BLUE SHIELD | Admitting: Gastroenterology

## 2018-04-26 ENCOUNTER — Encounter: Payer: Self-pay | Admitting: Gastroenterology

## 2018-04-26 VITALS — BP 117/83 | HR 86 | Temp 98.4°F | Resp 17 | Ht 69.0 in | Wt 210.0 lb

## 2018-04-26 DIAGNOSIS — K2970 Gastritis, unspecified, without bleeding: Secondary | ICD-10-CM

## 2018-04-26 DIAGNOSIS — R1013 Epigastric pain: Secondary | ICD-10-CM

## 2018-04-26 DIAGNOSIS — K219 Gastro-esophageal reflux disease without esophagitis: Secondary | ICD-10-CM | POA: Diagnosis not present

## 2018-04-26 DIAGNOSIS — K2950 Unspecified chronic gastritis without bleeding: Secondary | ICD-10-CM | POA: Diagnosis not present

## 2018-04-26 MED ORDER — SODIUM CHLORIDE 0.9 % IV SOLN: 500.0000 mL | Freq: Once | INTRAVENOUS | Status: DC

## 2018-04-26 NOTE — Patient Instructions (Signed)
YOU HAD AN ENDOSCOPIC PROCEDURE TODAY AT THE [facility]:   Refer to the procedure report that was given to you for any specific questions about what was found during the examination.  If the procedure report does not answer your questions, please call your gastroenterologist to clarify.  If you requested that your care partner not be given the details of your procedure findings, then the procedure report has been included in a sealed envelope for you to review at your convenience later.  YOU SHOULD EXPECT: Some feelings of bloating in the abdomen. Passage of more gas than usual.  Walking can help get rid of the air that was put into your GI tract during the procedure and reduce the bloating. If you had a lower endoscopy (such as a colonoscopy or flexible sigmoidoscopy) you may notice spotting of blood in your stool or on the toilet paper. If you underwent a bowel prep for your procedure, you may not have a normal bowel movement for a few days.  Please Note:  You might notice some irritation and congestion in your nose or some drainage.  This is from the oxygen used during your procedure.  There is no need for concern and it should clear up in a day or so.  SYMPTOMS TO REPORT IMMEDIATELY:   Following upper endoscopy (EGD)  Vomiting of blood or coffee ground material  New chest pain or pain under the shoulder blades  Painful or persistently difficult swallowing  New shortness of breath  Fever of 100F or higher  Black, tarry-looking stools  For urgent or emergent issues, a gastroenterologist can be reached at any hour by calling [phone].   DIET:  We do recommend a small meal at first, but then you may proceed to your regular diet.  Drink plenty of fluids but you should avoid alcoholic beverages for 24 hours.  ACTIVITY:  You should plan to take it easy for the rest of today and you should NOT DRIVE or use heavy machinery until tomorrow (because of the sedation medicines used  during the test).    FOLLOW UP: Our staff will call the number listed on your records the next business day following your procedure to check on you and address any questions or concerns that you may have regarding the information given to you following your procedure. If we do not reach you, we will leave a message.  However, if you are feeling well and you are not experiencing any problems, there is no need to return our call.  We will assume that you have returned to your regular daily activities without incident.  If any biopsies were taken you will be contacted by phone or by letter within the next 1-3 weeks.  Please call us at [phone] if you have not heard about the biopsies in 3 weeks.    SIGNATURES/CONFIDENTIALITY: You and/or your care partner have signed paperwork which will be entered into your electronic medical record.  These signatures attest to the fact that that the information above on your After Visit Summary has been reviewed and is understood.  Full responsibility of the confidentiality of this discharge information lies with you and/or your care-partner. 

## 2018-04-26 NOTE — Progress Notes (Signed)
Called to room to assist during endoscopic procedure.  Patient ID and intended procedure confirmed with present staff. Received instructions for my participation in the procedure from the performing physician.  

## 2018-04-26 NOTE — Progress Notes (Signed)
Report given to PACU, vss 

## 2018-04-26 NOTE — Op Note (Signed)
[facility] Patient Name: John Simmons Procedure Date: 04/26/2018 2:57 PM MRN: [id number] Endoscopist: Viviann Spare P. Adela Lank , MD Age: 43 Referring MD:  Date of Birth: November 16, 1975 Gender: Male Account #: [id number] Procedure:                Upper GI endoscopy Indications:              Epigastric abdominal pain, Follow-up of                            gastro-esophageal reflux disease - previously                            severe while on pantoprazole and carafate, now on                            omeprazole 40mg  twice daily and Pepcid twice daily                            with better control of symptoms Medicines:                Monitored Anesthesia Care Procedure:                Pre-Anesthesia Assessment:                           - Prior to the procedure, a History and Physical                            was performed, and patient medications and                            allergies were reviewed. The patient's tolerance of                            previous anesthesia was also reviewed. The risks                            and benefits of the procedure and the sedation                            options and risks were discussed with the patient.                            All questions were answered, and informed consent                            was obtained. Prior Anticoagulants: The patient has                            taken no previous anticoagulant or antiplatelet                            agents. ASA Grade Assessment: II - A patient with  mild systemic disease. After reviewing the risks                            and benefits, the patient was deemed in                            satisfactory condition to undergo the procedure.                           After obtaining informed consent, the endoscope was                            passed under direct vision. Throughout the                            procedure, the patient's blood  pressure, pulse, and                            oxygen saturations were monitored continuously. The                            Model GIF-[id number]) scope was introduced                            through the mouth, and advanced to the second part                            of duodenum. The upper GI endoscopy was                            accomplished without difficulty. The patient                            tolerated the procedure well. Scope In: Scope Out: Findings:                 Esophagogastric landmarks were identified: the                            Z-line was found at 41 cm, the gastroesophageal                            junction was found at 41 cm and the upper extent of                            the gastric folds was found at 41 cm from the                            incisors.                           The exam of the esophagus was otherwise normal.                           The entire  examined stomach was normal.                           The duodenal bulb and second portion of the                            duodenum were normal.                           Biopsies were taken with a cold forceps in the                            gastric body, at the incisura and in the gastric                            antrum for Helicobacter pylori testing. Complications:            No immediate complications. Estimated blood loss:                            Minimal. Estimated Blood Loss:     Estimated blood loss was minimal. Impression:               - Esophagogastric landmarks identified.                           - Normal esophagus - no Barrett's                           - Normal stomach.                           - Normal duodenal bulb and second portion of the                            duodenum.                           - Biopsies taken to rule out H pylori. Recommendation:           - Patient has a contact number available for                            emergencies. The  signs and symptoms of potential                            delayed complications were discussed with the                            patient. Return to normal activities tomorrow.                            Written discharge instructions were provided to the                            patient.                           -  Resume previous diet.                           - Continue present medications.                           - Await pathology results. Viviann Spare P. Solene Hereford, MD 04/26/2018 3:16:00 PM This report has been signed electronically.

## 2018-04-27 ENCOUNTER — Telehealth: Payer: Self-pay

## 2018-04-27 NOTE — Telephone Encounter (Signed)
Number identifier, follow up call made, left a voicemail.

## 2018-04-27 NOTE — Telephone Encounter (Signed)
No answer, unable to leave a message, will try a call back later today, B.Zelig Gacek RN

## 2018-04-29 ENCOUNTER — Telehealth: Payer: Self-pay | Admitting: Physician Assistant

## 2018-04-29 NOTE — Telephone Encounter (Signed)
LM letting pt know that the Short Term Disability forms need to be completed by the GI provider and he has a section to complete and send in.

## 2018-04-29 NOTE — Telephone Encounter (Signed)
Will need to forward to GI -- Treating providers. Forms handed back to [place]

## 2018-04-29 NOTE — Telephone Encounter (Signed)
I have placed forms from [employer] in the bin upfront. They are asking for a physician statement and medical records. They have a charge sheet attached.

## 2018-05-04 ENCOUNTER — Encounter: Payer: Self-pay | Admitting: Gastroenterology

## 2018-05-04 ENCOUNTER — Other Ambulatory Visit: Payer: Self-pay | Admitting: Medical

## 2018-05-07 ENCOUNTER — Other Ambulatory Visit: Payer: Self-pay | Admitting: Emergency Medicine

## 2018-05-07 DIAGNOSIS — I10 Essential (primary) hypertension: Secondary | ICD-10-CM

## 2018-05-07 MED ORDER — AMLODIPINE BESYLATE 10 MG PO TABS: 10.0000 mg | ORAL_TABLET | Freq: Every day | ORAL | 0 refills | Status: DC

## 2018-05-11 ENCOUNTER — Ambulatory Visit: Payer: BLUE CROSS/BLUE SHIELD | Admitting: Gastroenterology

## 2018-05-24 ENCOUNTER — Encounter (HOSPITAL_BASED_OUTPATIENT_CLINIC_OR_DEPARTMENT_OTHER): Payer: Self-pay

## 2018-05-24 ENCOUNTER — Emergency Department (HOSPITAL_BASED_OUTPATIENT_CLINIC_OR_DEPARTMENT_OTHER)
Admission: EM | Admit: 2018-05-24 | Discharge: 2018-05-24 | Disposition: A | Discharge status: Home / Self Care | Payer: BLUE CROSS/BLUE SHIELD | Attending: Emergency Medicine | Admitting: Emergency Medicine

## 2018-05-24 ENCOUNTER — Other Ambulatory Visit: Payer: Self-pay

## 2018-05-24 DIAGNOSIS — I10 Essential (primary) hypertension: Secondary | ICD-10-CM | POA: Insufficient documentation

## 2018-05-24 DIAGNOSIS — Z760 Encounter for issue of repeat prescription: Secondary | ICD-10-CM | POA: Insufficient documentation

## 2018-05-24 DIAGNOSIS — G44209 Tension-type headache, unspecified, not intractable: Secondary | ICD-10-CM | POA: Insufficient documentation

## 2018-05-24 DIAGNOSIS — Z79899 Other long term (current) drug therapy: Secondary | ICD-10-CM | POA: Insufficient documentation

## 2018-05-24 LAB — CBC WITH DIFFERENTIAL/PLATELET
Abs Immature Granulocytes: 0.01 10*3/uL (ref 0.00–0.07)
Basophils Absolute: 0 10*3/uL (ref 0.0–0.1)
Basophils Relative: 0 %
Eosinophils Absolute: 0.1 10*3/uL (ref 0.0–0.5)
Eosinophils Relative: 1 %
HCT: 42.9 % (ref 39.0–52.0)
HEMOGLOBIN: 14.2 g/dL (ref 13.0–17.0)
Immature Granulocytes: 0 %
Lymphocytes Relative: 29 %
Lymphs Abs: 1.4 10*3/uL (ref 0.7–4.0)
MCH: 31.1 pg (ref 26.0–34.0)
MCHC: 33.1 g/dL (ref 30.0–36.0)
MCV: 94.1 fL (ref 80.0–100.0)
Monocytes Absolute: 0.6 10*3/uL (ref 0.1–1.0)
Monocytes Relative: 12 %
Neutro Abs: 2.7 10*3/uL (ref 1.7–7.7)
Neutrophils Relative %: 58 %
Platelets: 260 10*3/uL (ref 150–400)
RBC: 4.56 MIL/uL (ref 4.22–5.81)
RDW: 12.4 % (ref 11.5–15.5)
WBC: 4.6 10*3/uL (ref 4.0–10.5)
nRBC: 0 % (ref 0.0–0.2)

## 2018-05-24 LAB — COMPREHENSIVE METABOLIC PANEL
ALT: 16 U/L (ref 0–44)
AST: 16 U/L (ref 15–41)
Albumin: 4.2 g/dL (ref 3.5–5.0)
Alkaline Phosphatase: 54 U/L (ref 38–126)
Anion gap: 8 (ref 5–15)
BUN: 12 mg/dL (ref 6–20)
CO2: 24 mmol/L (ref 22–32)
Calcium: 8.8 mg/dL — ABNORMAL LOW (ref 8.9–10.3)
Chloride: 102 mmol/L (ref 98–111)
Creatinine, Ser: 1.19 mg/dL (ref 0.61–1.24)
GFR calc Af Amer: >60 mL/min (ref >60)
GFR calc non Af Amer: >60 mL/min (ref >60)
Glucose, Bld: 88 mg/dL (ref 70–99)
Potassium: 3.6 mmol/L (ref 3.5–5.1)
SODIUM: 134 mmol/L — AB (ref 135–145)
Total Bilirubin: 0.6 mg/dL (ref 0.3–1.2)
Total Protein: 7.5 g/dL (ref 6.5–8.1)

## 2018-05-24 MED ORDER — SODIUM CHLORIDE 0.9 % IV BOLUS
1000.0000 mL | Freq: Once | INTRAVENOUS | Status: AC
  Administered 2018-05-24: 1000 mL via INTRAVENOUS

## 2018-05-24 MED ORDER — HYDRALAZINE HCL 25 MG PO TABS
ORAL_TABLET | ORAL | Status: AC
  Filled 2018-05-24: qty 1

## 2018-05-24 MED ORDER — LISINOPRIL 40 MG PO TABS: 40.0000 mg | ORAL_TABLET | Freq: Every day | ORAL | 0 refills | Status: DC

## 2018-05-24 MED ORDER — ACETAMINOPHEN 325 MG PO TABS
650.0000 mg | ORAL_TABLET | Freq: Once | ORAL | Status: AC
  Administered 2018-05-24: 650 mg via ORAL
  Filled 2018-05-24: qty 2

## 2018-05-24 MED ORDER — HYDRALAZINE HCL 10 MG PO TABS
10.0000 mg | ORAL_TABLET | Freq: Once | ORAL | Status: AC
  Administered 2018-05-24: 10 mg via ORAL
  Filled 2018-05-24: qty 1

## 2018-05-24 NOTE — ED Provider Notes (Signed)
[facility] EMERGENCY DEPARTMENT Provider Note   CSN: [id number] Arrival date & time: 05/24/18  1827     History   Chief Complaint Chief Complaint  Patient presents with  . Hypertension    HPI John Simmons is a 43 y.o. male with a past medical history of hypertension currently on lisinopril presents to ED for hypertension and headache.  States that for the past 3 days he has had elevated blood pressure readings at home.  He developed a frontal headache today which felt like sinus pressure.  He took his last dose of lisinopril today and he ran out of refills.  He has not taken anything for the headache.  Denies vision changes, chest pain, shortness of breath, injuries or falls, numbness in arms or legs.  States that the medication he was given on his prior visit did help with his symptoms.  Per triage note, patient's family member passed away last weekend and is unsure if this is the cause of his hypertension.  HPI  Past Medical History:  Diagnosis Date  . Blood transfusion without reported diagnosis    for anemia late 20's or early 30's  . Chronic neck pain   . Colon polyp   . GERD (gastroesophageal reflux disease)   . Gout   . Hyperlipidemia   . Hypertension     Patient Active Problem List   Diagnosis Date Noted  . Chronic gastritis without bleeding 03/08/2018  . Non-intractable vomiting 03/08/2018  . Chronic gout involving toe of left foot without tophus 02/15/2018  . Hyperlipidemia 05/05/2016  . Prostate cancer screening 12/21/2015  . Visit for preventive health examination 12/21/2015  . Anxiety and depression 02/27/2015  . Essential hypertension, benign 03/08/2014  . GERD (gastroesophageal reflux disease) 03/08/2014    Past Surgical History:  Procedure Laterality Date  . ANKLE FRACTURE SURGERY Right   . COLONOSCOPY     around 2014 or 2015  . ESOPHAGOGASTRODUODENOSCOPY     more than 10 years ago  . HEMORRHOID BANDING  2016  . negative           Home Medications    Prior to Admission medications   Medication Sig Start Date End Date Taking? Authorizing Provider  allopurinol (ZYLOPRIM) 300 MG tablet Take 1 tablet (300 mg total) by mouth daily. Patient not taking: Reported on 03/05/2018 02/15/18   Waldon MerlMartin, William C, PA-C  amLODipine (NORVASC) 10 MG tablet Take 1 tablet (10 mg total) by mouth daily. 05/07/18   Waldon MerlMartin, William C, PA-C  famotidine (PEPCID) 40 MG tablet Take 1 tablet (40 mg total) by mouth 2 (two) times daily. 04/12/18   Unk LightningLemmon, Jennifer Lynne, PA  lisinopril (PRINIVIL,ZESTRIL) 40 MG tablet Take 1 tablet (40 mg total) by mouth daily. 05/24/18   Willia Genrich, PA-C  omeprazole (PRILOSEC) 40 MG capsule Take 1 capsule (40 mg total) by mouth 2 (two) times daily. Take 30-60 minutes before breakfast and dinner. 04/12/18   Unk LightningLemmon, Jennifer Lynne, PA  pantoprazole (PROTONIX) 40 MG tablet Take 1 tablet (40 mg total) by mouth daily. 02/25/18   Waldon MerlMartin, William C, PA-C  promethazine (PHENERGAN) 25 MG tablet Take 1 tablet (25 mg total) by mouth every 8 (eight) hours as needed for nausea or vomiting. 03/15/18   Waldon MerlMartin, William C, PA-C  sucralfate (CARAFATE) 1 g tablet Take 1 tablet (1 g total) by mouth 4 (four) times daily -  with meals and at bedtime. 02/25/18   Waldon MerlMartin, William C, PA-C  tadalafil (ADCIRCA/CIALIS) 20 MG  tablet TAKE 1 TABLET BY MOUTH ONCE DAILY AS NEEDED FOR ERECTILE DISFUNCTION 03/05/18   Waldon Merl, PA-C    Family History Family History  Problem Relation Age of Onset  . Hypertension Mother   . Diabetes Mother   . Hypertension Father   . Colonic polyp Paternal Grandfather   . Esophageal cancer Neg Hx   . Colon cancer Neg Hx     Social History Social History   Tobacco Use  . Smoking status: Never Smoker  . Smokeless tobacco: Never Used  Substance Use Topics  . Alcohol use: Yes    Comment: occasionally  . Drug use: No     Allergies   Patient has no known allergies.   Review of  Systems Review of Systems  Constitutional: Negative for appetite change, chills and fever.  HENT: Negative for ear pain, rhinorrhea, sneezing and sore throat.   Eyes: Negative for photophobia and visual disturbance.  Respiratory: Negative for cough, chest tightness, shortness of breath and wheezing.   Cardiovascular: Negative for chest pain and palpitations.  Gastrointestinal: Negative for abdominal pain, blood in stool, constipation, diarrhea, nausea and vomiting.  Genitourinary: Negative for dysuria, hematuria and urgency.  Musculoskeletal: Negative for myalgias.  Skin: Negative for rash.  Neurological: Positive for headaches. Negative for dizziness, weakness and light-headedness.     Physical Exam Updated Vital Signs BP (!) 148/104   Pulse 65   Temp 98.1 F (36.7 C) (Oral)   Resp 14   Ht 5\' 10"  (1.778 m)   Wt 95.3 kg   SpO2 100%   BMI 30.13 kg/m   Physical Exam Vitals signs and nursing note reviewed.  Constitutional:      General: He is not in acute distress.    Appearance: He is well-developed.  HENT:     Head: Normocephalic and atraumatic.     Nose: Nose normal.  Eyes:     General: No scleral icterus.       Left eye: No discharge.     Conjunctiva/sclera: Conjunctivae normal.  Neck:     Musculoskeletal: Normal range of motion and neck supple.  Cardiovascular:     Rate and Rhythm: Normal rate and regular rhythm.     Heart sounds: Normal heart sounds. No murmur. No friction rub. No gallop.   Pulmonary:     Effort: Pulmonary effort is normal. No respiratory distress.     Breath sounds: Normal breath sounds.  Abdominal:     General: Bowel sounds are normal. There is no distension.     Palpations: Abdomen is soft.     Tenderness: There is no abdominal tenderness. There is no guarding.  Musculoskeletal: Normal range of motion.  Skin:    General: Skin is warm and dry.     Findings: No rash.  Neurological:     General: No focal deficit present.     Mental Status:  He is alert and oriented to person, place, and time.     Cranial Nerves: No cranial nerve deficit.     Sensory: No sensory deficit.     Motor: No weakness or abnormal muscle tone.     Coordination: Coordination normal.  Psychiatric:        Mood and Affect: Mood is anxious.      ED Treatments / Results  Labs (all labs ordered are listed, but only abnormal results are displayed) Labs Reviewed  COMPREHENSIVE METABOLIC PANEL - Abnormal; Notable for the following components:      Result Value  Sodium 134 (*)    Calcium 8.8 (*)    All other components within normal limits  CBC WITH DIFFERENTIAL/PLATELET    EKG None  Radiology No results found.  Procedures Procedures (including critical care time)  Medications Ordered in ED Medications  hydrALAZINE (APRESOLINE) tablet 10 mg (10 mg Oral Given 05/24/18 2156)  acetaminophen (TYLENOL) tablet 650 mg (650 mg Oral Given 05/24/18 2148)  sodium chloride 0.9 % bolus 1,000 mL ( Intravenous Stopped 05/24/18 2250)     Initial Impression / Assessment and Plan / ED Course  I have reviewed the triage vital signs and the nursing notes.  Pertinent labs & imaging results that were available during my care of the patient were reviewed by me and considered in my medical decision making (see chart for details).     43 year old male presents to ED for high blood pressure.  Reports developing a frontal headache/sinus pressure today.  He is concerned that his blood pressure has been high.  He was seen and evaluated in December 2019 and was given p.o. hydralazine with improvement in his symptoms.  Does admit that his family member passed away last weekend and has high blood pressure since.  He is also out of his lisinopril with his last dose taken today.  On my exam he is overall well-appearing.  He does appear anxious, concerned, constantly looking at the monitor for his latest blood pressure reading.  No deficits on neurological exam noted.  He denies any  chest pain, shortness of breath, vision changes or vomiting.  Patient given p.o. hydralazine, Tylenol and fluids with significant improvement in his symptoms.  His hypertension has improved although still 148/104.  His headache has been resolved.  CBC, CMP unremarkable.  Will discharge home with medication refill and PCP follow-up. I see no indication for imaging or further work-up at this time as his symptoms have been present similarly in the past and have resolved completely here. There are no headache characteristics that are lateralizing or concerning for increased ICP, infectious or vascular cause of his symptoms.   Will advise him to return to ED for any severe worsening symptoms.  Patient is hemodynamically stable, in NAD, and able to ambulate in the ED. Evaluation does not show pathology that would require ongoing emergent intervention or inpatient treatment. I explained the diagnosis to the patient. Pain has been managed and has no complaints prior to discharge. Patient is comfortable with above plan and is stable for discharge at this time. All questions were answered prior to disposition. Strict return precautions for returning to the ED were discussed. Encouraged follow up with PCP.    Portions of this note were generated with Scientist, clinical (histocompatibility and immunogenetics)Dragon dictation software. Dictation errors may occur despite best attempts at proofreading.   Final Clinical Impressions(s) / ED Diagnoses   Final diagnoses:  Hypertension, unspecified type  Tension headache  Encounter for medication refill    ED Discharge Orders         Ordered    lisinopril (PRINIVIL,ZESTRIL) 40 MG tablet  Daily     05/24/18 2319           Dietrich PatesKhatri, Jeanenne Licea, PA-C 05/24/18 2325    Mesner, Barbara CowerJason, MD 05/25/18 [phone]

## 2018-05-24 NOTE — ED Notes (Signed)
Pt reports family member passed away last weekend. Noticed HTN approx 4 days later. Denies chest pain. Pt has been taking his lisinopril as prescribed.

## 2018-05-24 NOTE — ED Triage Notes (Signed)
Pt c/o HTN despite saying he is compliant with his medications, he is c/o headache and dizziness, denies SOB, denies chest pain, c/o fatigue as well

## 2018-05-24 NOTE — Discharge Instructions (Signed)
I have given you a refill of your lisinopril.  However the emergency department is not an appropriate place for refills of your medications that you take chronically. You will need to follow-up with your primary care provider for any further refills or for any changes to your medications. Take Tylenol as needed for headache. Return to ED for worsening symptoms, chest pain, shortness of breath, vomiting or coughing up blood, blurry vision or numbness in arms or legs.

## 2018-06-08 ENCOUNTER — Other Ambulatory Visit: Payer: Self-pay | Admitting: Emergency Medicine

## 2018-06-08 MED ORDER — TADALAFIL 20 MG PO TABS: ORAL_TABLET | ORAL | 0 refills | Status: DC

## 2018-06-13 ENCOUNTER — Other Ambulatory Visit: Payer: Self-pay | Admitting: Physician Assistant

## 2018-06-14 NOTE — Telephone Encounter (Signed)
Patient is due for BP follow up

## 2018-06-25 ENCOUNTER — Telehealth: Payer: Self-pay | Admitting: Physician Assistant

## 2018-06-29 MED ORDER — TADALAFIL 20 MG PO TABS: ORAL_TABLET | ORAL | 2 refills | Status: DC

## 2018-06-29 NOTE — Telephone Encounter (Signed)
Patient called to ask why his refill request was refused.  Agent informed patient that an appointment was needed before he could get refills.  Patient stated that he has started a new job and cannot take time off from work.  Patient wanted to know if he could get some refills to last him until he could get some time off from work.  Please advise and call patient back at [phone]

## 2018-06-29 NOTE — Telephone Encounter (Signed)
Ok to refill Cialis 31-month supply with 2 refills. Will need to be seen after that

## 2018-06-29 NOTE — Telephone Encounter (Signed)
Please advise 

## 2018-06-29 NOTE — Telephone Encounter (Signed)
Med filled called pt to advise to call office and schedule appt. VM full.   Ok for [facility] to Discuss results / PCP recommendations / Schedule patient.

## 2018-06-29 NOTE — Addendum Note (Signed)
Addended by: Geannie Risen on: 06/29/2018 04:26 PM   Modules accepted: Orders

## 2018-07-11 ENCOUNTER — Other Ambulatory Visit: Payer: Self-pay | Admitting: Physician Assistant

## 2018-07-26 ENCOUNTER — Other Ambulatory Visit: Payer: Self-pay | Admitting: Physician Assistant

## 2018-07-27 ENCOUNTER — Encounter: Payer: Self-pay | Admitting: Physician Assistant

## 2018-07-27 ENCOUNTER — Ambulatory Visit (INDEPENDENT_AMBULATORY_CARE_PROVIDER_SITE_OTHER): Payer: Managed Care, Other (non HMO) | Admitting: Physician Assistant

## 2018-07-27 ENCOUNTER — Other Ambulatory Visit: Payer: Self-pay

## 2018-07-27 VITALS — BP 121/78 | Wt 215.0 lb

## 2018-07-27 DIAGNOSIS — I10 Essential (primary) hypertension: Secondary | ICD-10-CM

## 2018-07-27 MED ORDER — LISINOPRIL 40 MG PO TABS: ORAL_TABLET | ORAL | 1 refills | Status: DC

## 2018-07-27 NOTE — Progress Notes (Signed)
I have discussed the procedure for the virtual visit with the patient who has given consent to proceed with assessment and treatment.   John Simmons S Breella Vanostrand, CMA     

## 2018-07-27 NOTE — Telephone Encounter (Signed)
Spoke with patient scheduled appointment today for refills of BP. Agreeable with Doxy me video

## 2018-07-27 NOTE — Assessment & Plan Note (Signed)
BP stable. Asymptomatic. Medications refilled.   Recent BMP reviewed in EMR from ER visit. Stable renal function and electrolytes. Follow-up 3 months in office.

## 2018-07-27 NOTE — Progress Notes (Signed)
Virtual Visit via Video Note I connected with John Simmons on 07/27/18 at  1:00 PM EDT by a video enabled telemedicine application and verified that I am speaking with the correct person using two identifiers.   I discussed the limitations of evaluation and management by telemedicine and the availability of in person appointments. The patient expressed understanding and agreed to proceed.  History of Present Illness: Patient presents today via WebEx for follow-up of hypertension. Patient is currently on a regimen of amlodipine 10 mg daily and Lisinopril 40 mg daily. Endorses taking daily as directed but has misplaced his lisinopril in the past 2 days and unable to find. Needs refill. Patient denies chest pain, palpitations, lightheadedness, dizziness, vision changes or frequent headaches. Diet is well-balanced overall. Is walking at night, allowing him to exercise while social distancing.   BP Readings from Last 3 Encounters:  07/27/18 121/78  05/24/18 (!) 148/104  04/26/18 117/83   Observations/Objective: Patient is well-developed, well-nourished in no acute distress.  Resting comfortably on sofa at home.  Head is normocephalic, atraumatic.  No labored breathing.  Speech is clear and coherent with logical contest.  Patient is alert and oriented at baseline.   Assessment and Plan: Essential hypertension, benign BP stable. Asymptomatic. Medications refilled.   Recent BMP reviewed in EMR from ER visit. Stable renal function and electrolytes. Follow-up 3 months in office.   Follow Up Instructions: Follow-up in office in 3 months.    I discussed the assessment and treatment plan with the patient. The patient was provided an opportunity to ask questions and all were answered. The patient agreed with the plan and demonstrated an understanding of the instructions.   The patient was advised to call back or seek an in-person evaluation if the symptoms worsen or if the condition fails to  improve as anticipated.   Piedad Climes, PA-C

## 2018-08-02 ENCOUNTER — Telehealth: Payer: Self-pay | Admitting: Emergency Medicine

## 2018-08-02 NOTE — Telephone Encounter (Signed)
Left message on patient VM to inquire on reason for call and what patient wanted from provider.    Copied from CRM [id number]. Topic: General - Inquiry >> Aug 02, 2018  2:50 PM Baldo Daub L wrote: Reason for CRM:   Pt called and left message on [facility] mailbox.  States that he would like a call back from provider.

## 2018-08-09 ENCOUNTER — Other Ambulatory Visit: Payer: Self-pay

## 2018-08-09 ENCOUNTER — Ambulatory Visit (INDEPENDENT_AMBULATORY_CARE_PROVIDER_SITE_OTHER): Payer: Managed Care, Other (non HMO) | Admitting: Physician Assistant

## 2018-08-09 DIAGNOSIS — N3000 Acute cystitis without hematuria: Secondary | ICD-10-CM

## 2018-08-09 MED ORDER — CIPROFLOXACIN HCL 500 MG PO TABS: 500.0000 mg | ORAL_TABLET | Freq: Two times a day (BID) | ORAL | 0 refills | Status: DC

## 2018-08-09 NOTE — Progress Notes (Signed)
Virtual Visit via Video   I connected with patient on 08/09/18 at 10:00 AM EDT by a video enabled telemedicine application and verified that I am speaking with the correct person using two identifiers.  Location patient: Home Location provider: [place] [facility], Office Persons participating in the virtual visit: Patient, Provider, CMA (Patina Moore)  I discussed the limitations of evaluation and management by telemedicine and the availability of in person appointments. The patient expressed understanding and agreed to proceed.  Subjective:   HPI:   Patient presents via [email or web address] to discuss urinary symptoms. Patient endorses having dysuria and urinary frequency starting about 4 weeks ago. Let this persist for about 2 weeks before going to an Urgent Care (trying to obtain records). Notes he was seen and urine testing there was negative. Was called a few days later letting him know his urine showed signs of bladder infection on culture. Was started on a 10-day course of Cipro. States he finished about 1/2 of the medication before having to go out of town. Notes packing the rest of the pills in a zip loc bag but then lost them somewhere between there and being in his hotel. Notes he was feeling fine so thought he would be ok but notes recurrence of symptoms over the past week. Denies fever, chills, nausea, vomiting or back pain. Does note the dysuria with urgency. Denies hesitancy or incomplete bladder emptying.   ROS:   See pertinent positives and negatives per HPI.  Patient Active Problem List   Diagnosis Date Noted  . Chronic gastritis without bleeding 03/08/2018  . Non-intractable vomiting 03/08/2018  . Chronic gout involving toe of left foot without tophus 02/15/2018  . Hyperlipidemia 05/05/2016  . Prostate cancer screening 12/21/2015  . Visit for preventive health examination 12/21/2015  . Anxiety and depression 02/27/2015  . Essential hypertension, benign 03/08/2014  . GERD  (gastroesophageal reflux disease) 03/08/2014    Social History   Tobacco Use  . Smoking status: Never Smoker  . Smokeless tobacco: Never Used  Substance Use Topics  . Alcohol use: Yes    Comment: occasionally    Current Outpatient Medications:  .  allopurinol (ZYLOPRIM) 300 MG tablet, Take 1 tablet (300 mg total) by mouth daily. (Patient not taking: Reported on 03/05/2018), Disp: 30 tablet, Rfl: 3 .  amLODipine (NORVASC) 10 MG tablet, Take 1 tablet (10 mg total) by mouth daily., Disp: 90 tablet, Rfl: 0 .  famotidine (PEPCID) 40 MG tablet, Take 1 tablet (40 mg total) by mouth 2 (two) times daily., Disp: 60 tablet, Rfl: 5 .  lisinopril (PRINIVIL,ZESTRIL) 40 MG tablet, Take 1 tablet PO daily, Disp: 90 tablet, Rfl: 1 .  pantoprazole (PROTONIX) 40 MG tablet, Take 1 tablet (40 mg total) by mouth daily., Disp: 30 tablet, Rfl: 3 .  tadalafil (ADCIRCA/CIALIS) 20 MG tablet, TAKE 1 TABLET BY MOUTH ONCE DAILY AS NEEDED FOR ERECTILE DYSFUNCTION., Disp: 4 tablet, Rfl: 2  No Known Allergies  Objective:   There were no vitals taken for this visit.  Patient is well-developed, well-nourished in no acute distress.  Resting comfortably at home.  Head is normocephalic, atraumatic.  No labored breathing.  Speech is clear and coherent with logical contest.  Patient is alert and oriented at baseline.   Assessment and Plan:    1. Acute cystitis without hematuria Prior diagnosis. Unable to complete treatment due to losing medication. Recurrence of symptoms over past week. Trying to get records from the Urgent Care for review. Will send in 10-day  course of Cipro 500 for him to complete. He is to come in for repeat Urine culture once treatment is finished to ensure resolution. Will alter regimen based on records for Urgent Care once I have received them.    Piedad ClimesWilliam Cody Zeinab Rodwell, PA-C 08/09/2018

## 2018-08-09 NOTE — Progress Notes (Signed)
I have discussed the procedure for the virtual visit with the patient who has given consent to proceed with assessment and treatment.   Makendra Vigeant S Kerston Landeck, CMA     

## 2018-08-16 ENCOUNTER — Other Ambulatory Visit: Payer: Self-pay | Admitting: Physician Assistant

## 2018-08-26 ENCOUNTER — Other Ambulatory Visit: Payer: Self-pay | Admitting: Physician Assistant

## 2018-08-26 DIAGNOSIS — I10 Essential (primary) hypertension: Secondary | ICD-10-CM

## 2018-09-03 ENCOUNTER — Other Ambulatory Visit: Payer: Self-pay | Admitting: Physician Assistant

## 2018-09-03 DIAGNOSIS — N3000 Acute cystitis without hematuria: Secondary | ICD-10-CM

## 2018-09-04 ENCOUNTER — Other Ambulatory Visit: Payer: Self-pay | Admitting: Physician Assistant

## 2018-09-04 DIAGNOSIS — N3000 Acute cystitis without hematuria: Secondary | ICD-10-CM

## 2018-10-17 ENCOUNTER — Other Ambulatory Visit: Payer: Self-pay | Admitting: Physician Assistant

## 2018-10-27 ENCOUNTER — Other Ambulatory Visit: Payer: Self-pay | Admitting: Physician Assistant

## 2018-11-08 ENCOUNTER — Other Ambulatory Visit: Payer: Self-pay | Admitting: Physician Assistant

## 2018-11-08 DIAGNOSIS — I10 Essential (primary) hypertension: Secondary | ICD-10-CM

## 2018-11-08 NOTE — Telephone Encounter (Signed)
Patient needs a follow up 3 month for blood pressure

## 2018-12-06 ENCOUNTER — Other Ambulatory Visit: Payer: Self-pay | Admitting: Physician Assistant

## 2018-12-08 ENCOUNTER — Other Ambulatory Visit: Payer: Self-pay | Admitting: Physician Assistant

## 2018-12-08 DIAGNOSIS — I10 Essential (primary) hypertension: Secondary | ICD-10-CM

## 2018-12-10 ENCOUNTER — Other Ambulatory Visit: Payer: Self-pay

## 2018-12-10 ENCOUNTER — Encounter: Payer: Self-pay | Admitting: Physician Assistant

## 2018-12-10 ENCOUNTER — Ambulatory Visit (INDEPENDENT_AMBULATORY_CARE_PROVIDER_SITE_OTHER): Payer: Managed Care, Other (non HMO) | Admitting: Physician Assistant

## 2018-12-10 VITALS — BP 140/96 | HR 70

## 2018-12-10 DIAGNOSIS — I10 Essential (primary) hypertension: Secondary | ICD-10-CM

## 2018-12-10 NOTE — Progress Notes (Deleted)
   Virtual Visit via Video   I connected with patient on 12/10/18 at 11:00 AM EDT by a video enabled telemedicine application and verified that I am speaking with the correct person using two identifiers.  Location patient: Home Location provider: [facility], Office Persons participating in the virtual visit: Patient, Provider, [place] Ru[place] (Patina Moore)  I discussed the limitations of evaluation and management by telemedicine and the availability of in person appointments. The patient expressed understanding and agreed to proceed.  Subjective:   HPI:   Patient presents via Doxy.Me today for follow-up of hypertension. Patient is currently prescribed a regimen of lisinopril 40 mg daily and Amlodipine 10 mg daily. *** taking as directed. ***. ***.  ROS:   See pertinent positives and negatives per HPI.  Patient Active Problem List   Diagnosis Date Noted  . Chronic gastritis without bleeding 03/08/2018  . Non-intractable vomiting 03/08/2018  . Chronic gout involving toe of left foot without tophus 02/15/2018  . Hyperlipidemia 05/05/2016  . Prostate cancer screening 12/21/2015  . Visit for preventive health examination 12/21/2015  . Anxiety and depression 02/27/2015  . Essential hypertension, benign 03/08/2014  . GERD (gastroesophageal reflux disease) 03/08/2014    Social History   Tobacco Use  . Smoking status: Never Smoker  . Smokeless tobacco: Never Used  Substance Use Topics  . Alcohol use: Yes    Comment: occasionally    Current Outpatient Medications:  .  allopurinol (ZYLOPRIM) 300 MG tablet, Take 1 tablet (300 mg total) by mouth daily., Disp: 30 tablet, Rfl: 3 .  amLODipine (NORVASC) 10 MG tablet, TAKE 1 TABLET BY MOUTH ONCE DAILY . APPOINTMENT REQUIRED FOR FUTURE REFILLS, Disp: 15 tablet, Rfl: 0 .  famotidine (PEPCID) 40 MG tablet, Take 1 tablet (40 mg total) by mouth 2 (two) times daily., Disp: 60 tablet, Rfl: 5 .  lisinopril (ZESTRIL) 40 MG tablet, Take 1 tablet  by mouth once daily, Disp: 90 tablet, Rfl: 0 .  pantoprazole (PROTONIX) 40 MG tablet, Take 1 tablet (40 mg total) by mouth daily., Disp: 30 tablet, Rfl: 3 .  tadalafil (CIALIS) 20 MG tablet, TAKE 1 TABLET BY MOUTH ONCE DAILY AS NEEDED FOR ERECTILE DYSFUNCTION, Disp: 4 tablet, Rfl: 0  No Known Allergies  Objective:   BP (!) 153/116   Pulse 70   Patient is well-developed, well-nourished in no acute distress.  Resting comfortably *** at home.  Head is normocephalic, atraumatic.  No labored breathing.  Speech is clear and coherent with logical contest.  Patient is alert and oriented at baseline.  ***  Assessment and Plan:   ***.   Leeanne Rio, PA-C 12/10/2018

## 2018-12-10 NOTE — Progress Notes (Signed)
Virtual Visit via Video   I connected with patient on 12/13/18 at 11:00 AM EDT by a video enabled telemedicine application and verified that I am speaking with the correct person using two identifiers.  Location patient: Home Location provider: [facility], Office Persons participating in the virtual visit: Patient, Provider, [place] (Patina Moore)  I discussed the limitations of evaluation and management by telemedicine and the availability of in person appointments. The patient expressed understanding and agreed to proceed.  Subjective:   HPI:   Patient presents via Doxy.Me today for follow-up of hypertension. Patient is currently prescribed a regimen of lisinopril 40 mg daily and Amlodipine 10 mg daily. Endorses taking as directed. Takes medication in the morning. Took medication this morning about 8AM.  Has previously been well-controlled on this regimen. Patient denies chest pain, palpitations, lightheadedness, dizziness, vision changes or frequent headaches. BP check at home today shows BP quite high at 153/116. Notes machine is newer but when he checks at home, often shows "error message". Usually BP averages around 128/88 last check. Patient does note starting an over-the-counter energy supplement/testosterone booster a couple of weeks ago. Patient denies chest pain, palpitations, lightheadedness, dizziness, vision changes or frequent headaches. Endorses keeping a very good diet. Notes keeping well-hydrated. Is walking 1 hour every other day.   ROS:   See pertinent positives and negatives per HPI.  Patient Active Problem List   Diagnosis Date Noted  . Chronic gastritis without bleeding 03/08/2018  . Non-intractable vomiting 03/08/2018  . Chronic gout involving toe of left foot without tophus 02/15/2018  . Hyperlipidemia 05/05/2016  . Prostate cancer screening 12/21/2015  . Visit for preventive health examination 12/21/2015  . Anxiety and depression 02/27/2015  .  Essential hypertension, benign 03/08/2014  . GERD (gastroesophageal reflux disease) 03/08/2014    Social History   Tobacco Use  . Smoking status: Never Smoker  . Smokeless tobacco: Never Used  Substance Use Topics  . Alcohol use: Yes    Comment: occasionally    Current Outpatient Medications:  .  allopurinol (ZYLOPRIM) 300 MG tablet, Take 1 tablet (300 mg total) by mouth daily., Disp: 30 tablet, Rfl: 3 .  amLODipine (NORVASC) 10 MG tablet, TAKE 1 TABLET BY MOUTH ONCE DAILY . APPOINTMENT REQUIRED FOR FUTURE REFILLS, Disp: 15 tablet, Rfl: 0 .  famotidine (PEPCID) 40 MG tablet, Take 1 tablet (40 mg total) by mouth 2 (two) times daily., Disp: 60 tablet, Rfl: 5 .  pantoprazole (PROTONIX) 40 MG tablet, Take 1 tablet (40 mg total) by mouth daily., Disp: 30 tablet, Rfl: 3 .  tadalafil (CIALIS) 20 MG tablet, TAKE 1 TABLET BY MOUTH ONCE DAILY AS NEEDED FOR ERECTILE DYSFUNCTION, Disp: 4 tablet, Rfl: 0 .  lisinopril (ZESTRIL) 40 MG tablet, Take 1 tablet by mouth once daily, Disp: 90 tablet, Rfl: 0  No Known Allergies  Objective:   BP (!) 140/96   Pulse 70   Patient is well-developed, well-nourished in no acute distress.  Resting comfortably at home.  Head is normocephalic, atraumatic.  No labored breathing.  Speech is clear and coherent with logical content.  Patient is alert and oriented at baseline.   Assessment and Plan:   1. Essential hypertension, benign Above goal on check with home BP cuff. Will continue current regimen for now, having patient work harder on DASH diet. Stop supplement. Will have him come in to office early next week for labs, manual BP check. Will have him bring in his cuff so we can compare. Suspect  he needs new cuff.     Piedad ClimesWilliam Cody Caelynn Marshman, [place] JerseyPA-C 12/13/2018

## 2018-12-10 NOTE — Progress Notes (Signed)
I have discussed the procedure for the virtual visit with the patient who has given consent to proceed with assessment and treatment.   Glenn Gullickson S Asta Corbridge, CMA     

## 2018-12-11 ENCOUNTER — Other Ambulatory Visit: Payer: Self-pay | Admitting: Physician Assistant

## 2018-12-13 ENCOUNTER — Other Ambulatory Visit: Payer: Self-pay

## 2018-12-13 ENCOUNTER — Other Ambulatory Visit: Payer: Self-pay | Admitting: Physician Assistant

## 2018-12-13 ENCOUNTER — Ambulatory Visit (INDEPENDENT_AMBULATORY_CARE_PROVIDER_SITE_OTHER): Payer: Managed Care, Other (non HMO)

## 2018-12-13 VITALS — BP 140/92 | HR 92

## 2018-12-13 DIAGNOSIS — I10 Essential (primary) hypertension: Secondary | ICD-10-CM

## 2018-12-13 LAB — COMPREHENSIVE METABOLIC PANEL
ALT: 18 U/L (ref 0–53)
AST: 20 U/L (ref 0–37)
Albumin: 4.5 g/dL (ref 3.5–5.2)
Alkaline Phosphatase: 59 U/L (ref 39–117)
BUN: 11 mg/dL (ref 6–23)
CO2: 23 mEq/L (ref 19–32)
Calcium: 9.2 mg/dL (ref 8.4–10.5)
Chloride: 106 mEq/L (ref 96–112)
Creatinine, Ser: 1.2 mg/dL (ref 0.40–1.50)
GFR: 79.15 mL/min (ref >60.00)
Glucose, Bld: 104 mg/dL — ABNORMAL HIGH (ref 70–99)
Potassium: 4.2 mEq/L (ref 3.5–5.1)
Sodium: 139 mEq/L (ref 135–145)
Total Bilirubin: 0.2 mg/dL (ref 0.2–1.2)
Total Protein: 7.1 g/dL (ref 6.0–8.3)

## 2018-12-15 ENCOUNTER — Other Ambulatory Visit: Payer: Self-pay | Admitting: Emergency Medicine

## 2018-12-15 DIAGNOSIS — I10 Essential (primary) hypertension: Secondary | ICD-10-CM

## 2018-12-17 ENCOUNTER — Other Ambulatory Visit: Payer: Self-pay | Admitting: Physician Assistant

## 2018-12-22 ENCOUNTER — Other Ambulatory Visit: Payer: Self-pay | Admitting: Physician Assistant

## 2018-12-22 DIAGNOSIS — I10 Essential (primary) hypertension: Secondary | ICD-10-CM

## 2018-12-24 ENCOUNTER — Ambulatory Visit (INDEPENDENT_AMBULATORY_CARE_PROVIDER_SITE_OTHER): Payer: Managed Care, Other (non HMO) | Admitting: Physician Assistant

## 2018-12-24 ENCOUNTER — Encounter: Payer: Self-pay | Admitting: Physician Assistant

## 2018-12-24 ENCOUNTER — Other Ambulatory Visit: Payer: Self-pay

## 2018-12-24 VITALS — BP 124/87 | HR 77 | Ht 69.0 in | Wt 215.0 lb

## 2018-12-24 DIAGNOSIS — M109 Gout, unspecified: Secondary | ICD-10-CM | POA: Diagnosis not present

## 2018-12-24 MED ORDER — TADALAFIL 20 MG PO TABS: ORAL_TABLET | ORAL | 2 refills | Status: DC

## 2018-12-24 MED ORDER — COLCHICINE 0.6 MG PO TABS: 0.6000 mg | ORAL_TABLET | Freq: Two times a day (BID) | ORAL | 0 refills | Status: DC

## 2018-12-24 NOTE — Progress Notes (Signed)
   Virtual Visit via Video   I connected with patient on 12/24/18 at 11:00 AM EDT by a video enabled telemedicine application and verified that I am speaking with the correct person using two identifiers.  Location patient: Home Location provider: [facility], Office Persons participating in the virtual visit: Patient, Provider, [place] (Patina Moore)  I discussed the limitations of evaluation and management by telemedicine and the availability of in person appointments. The patient expressed understanding and agreed to proceed.  Subjective:   HPI:   Patient presents via Doxy.me today with concerns of a potential gout flare up. Notes waking up yesterday with redness, paina and swelling in R great toe, classic for previous gout flares. Notes he tried to stay off of it as much as possible. Denies fever, chills, decreased ROM. Notes pain with ROM but denies decreased ROM.    ROS:   See pertinent positives and negatives per HPI.  Patient Active Problem List   Diagnosis Date Noted  . Chronic gastritis without bleeding 03/08/2018  . Non-intractable vomiting 03/08/2018  . Chronic gout involving toe of left foot without tophus 02/15/2018  . Hyperlipidemia 05/05/2016  . Prostate cancer screening 12/21/2015  . Visit for preventive health examination 12/21/2015  . Anxiety and depression 02/27/2015  . Essential hypertension, benign 03/08/2014  . GERD (gastroesophageal reflux disease) 03/08/2014    Social History   Tobacco Use  . Smoking status: Never Smoker  . Smokeless tobacco: Never Used  Substance Use Topics  . Alcohol use: Yes    Comment: occasionally    Current Outpatient Medications:  .  amLODipine (NORVASC) 10 MG tablet, Take 1 tablet (10 mg total) by mouth daily., Disp: 90 tablet, Rfl: 0 .  famotidine (PEPCID) 40 MG tablet, Take 1 tablet (40 mg total) by mouth 2 (two) times daily., Disp: 60 tablet, Rfl: 5 .  lisinopril (ZESTRIL) 40 MG tablet, TAKE 1 TABLET BY MOUTH ONCE  DAILY, Disp: 90 tablet, Rfl: 0 .  pantoprazole (PROTONIX) 40 MG tablet, Take 1 tablet (40 mg total) by mouth daily., Disp: 30 tablet, Rfl: 3 .  tadalafil (CIALIS) 20 MG tablet, TAKE 1 TABLET BY MOUTH ONCE DAILY AS NEEDED FOR ERECTILE DYSFUNCTION, Disp: 4 tablet, Rfl: 2 .  allopurinol (ZYLOPRIM) 300 MG tablet, Take 1 tablet (300 mg total) by mouth daily. (Patient not taking: Reported on 12/24/2018), Disp: 30 tablet, Rfl: 3  No Known Allergies  Objective:   BP 124/87   Pulse 77   Ht 5\' 9"  (1.753 m)   Wt 215 lb (97.5 kg)   BMI 31.75 kg/m   Patient is well-developed, well-nourished in no acute distress.  Resting comfortably at home.  Head is normocephalic, atraumatic.  No labored breathing.  Speech is clear and coherent with logical contest.  Patient is alert and oriented at baseline.   Assessment and Plan:    1. Acute gout of right foot, unspecified cause Off of allopurinol for some time without issue. Denies change in diet. Keeping well-hydrated. Will start Colchicine for acute gout tx. Supportive measures and OTC medications reviewed. Recommend consider we recheck uric acid once all is resolved so we can determine restarting the allopurinol.    Leeanne Rio, PA-C 12/24/2018

## 2018-12-24 NOTE — Progress Notes (Signed)
I have discussed the procedure for the virtual visit with the patient who has given consent to proceed with assessment and treatment.   Malikiah Debarr S Lam Mccubbins, CMA     

## 2019-01-26 ENCOUNTER — Other Ambulatory Visit: Payer: Self-pay | Admitting: Physician Assistant

## 2019-03-03 ENCOUNTER — Other Ambulatory Visit: Payer: Self-pay | Admitting: Physician Assistant

## 2019-03-03 DIAGNOSIS — I10 Essential (primary) hypertension: Secondary | ICD-10-CM

## 2019-03-04 ENCOUNTER — Telehealth: Payer: Self-pay | Admitting: Physician Assistant

## 2019-03-04 ENCOUNTER — Other Ambulatory Visit: Payer: Self-pay | Admitting: Emergency Medicine

## 2019-03-04 NOTE — Telephone Encounter (Signed)
Pt called in asking for the Amlodipine to be sent to the [facility] on [address]. He would also like to start taking Viagra 50mg  again instead of the Cialis. Pt can be reached at the home #

## 2019-03-04 NOTE — Telephone Encounter (Signed)
Refilled patient Amlodipine to the pharmacy. Currently patient is taking Cialis.

## 2019-03-06 ENCOUNTER — Other Ambulatory Visit: Payer: Self-pay | Admitting: Physician Assistant

## 2019-03-06 MED ORDER — SILDENAFIL CITRATE 50 MG PO TABS: 25.0000 mg | ORAL_TABLET | Freq: Every day | ORAL | 0 refills | Status: DC | PRN

## 2019-03-06 NOTE — Telephone Encounter (Signed)
I have discontinued the cialis and sent in refill of the generic viagra (sildenafil) for patient.

## 2019-03-12 ENCOUNTER — Other Ambulatory Visit: Payer: Self-pay | Admitting: Physician Assistant

## 2019-03-18 ENCOUNTER — Other Ambulatory Visit: Payer: Self-pay | Admitting: Physician Assistant

## 2019-03-21 ENCOUNTER — Telehealth: Payer: Self-pay

## 2019-03-21 ENCOUNTER — Other Ambulatory Visit: Payer: Self-pay

## 2019-03-21 MED ORDER — COLCHICINE 0.6 MG PO TABS: 0.6000 mg | ORAL_TABLET | Freq: Two times a day (BID) | ORAL | 0 refills | Status: DC

## 2019-03-21 NOTE — Telephone Encounter (Signed)
Refilled colchicine. He is due once flare calm down to come in to recheck uric acid level and so we can restart allopurinol as he never did this after last flare in September.

## 2019-03-21 NOTE — Telephone Encounter (Signed)
LMOVM advising patient that med has been sent in and that nurse visit to recheck uric acid is due.

## 2019-03-21 NOTE — Addendum Note (Signed)
Addended by: Brunetta Jeans on: 03/21/2019 09:07 AM   Modules accepted: Orders

## 2019-03-21 NOTE — Telephone Encounter (Signed)
Please advise is meds can be sent in or if visit is needed   Patient Name: John Simmons Gender: Male DOB: [birth year] Age: 43 Y 53 M 11 D Return Phone Number: [phone] (Secondary) Address: City/State/Zip: [place] [address] Client  Primary [facility] Night - C Client Site [place] Physician AA - PHYSICIAN, NOT LISTED- MD Contact Type Call Who Is Calling Patient / Member / Family / Caregiver Call Type Triage / Clinical Relationship To Patient Self Return Phone Number [phone] (Secondary) Chief Complaint Foot Pain Reason for Call Symptomatic / Request for [place] states he is needing a medication refill on: gout, unknown name. He is wondering if this can be filled today if possible, flare up since last night. MD: Elyn Aquas. Holiday representative at [employer]. Translation No Nurse Assessment Nurse: Laurena Bering, RN, Helene Kelp Date/Time Eilene Ghazi Time): 03/18/2019 9:42:05 AM Confirm and document reason for call. If symptomatic, describe symptoms. ---Caller states that he is having a gout flare up on left foot. Has the patient had close contact with a person known or suspected to have the novel coronavirus illness OR traveled / lives in area with major community spread (including international travel) in the last 14 days from the onset of symptoms? * If Asymptomatic, screen for exposure and travel within the last 14 days. ---No Does the patient have any new or worsening symptoms? ---Yes Will a triage be completed? ---Yes Related visit to physician within the last 2 weeks? ---No Does the PT have any chronic conditions? (i.e. diabetes, asthma, this includes High risk factors for pregnancy, etc.) ---Yes List chronic conditions. ---HTN Is this a behavioral health or substance abuse call? ---No Guidelines Guideline Title Affirmed Question Affirmed Notes Nurse Date/Time  Eilene Ghazi Time) Foot Pain [1] SEVERE pain (e.g., excruciating, unable to do any normal activities)

## 2019-03-29 ENCOUNTER — Telehealth: Payer: Self-pay

## 2019-03-29 NOTE — Telephone Encounter (Signed)
Spoke with pharmacist from [facility] again. Stated that patient has to call the number on the back of insurance card to get approval for early fill, cannot be verbal OK from PCP. Advised patient to call insurance. Gave verbal understanding

## 2019-03-29 NOTE — Telephone Encounter (Signed)
Pt called back to follow up on refill of medication. Please advise and Thank you!

## 2019-03-29 NOTE — Telephone Encounter (Signed)
Ok to approve an early fill. Will be up to insurance whether or not they will pay for it early though.

## 2019-03-29 NOTE — Telephone Encounter (Signed)
Patient called in stating that he needed a new prescription for his Lisinopril 40 MG. I called and spoke with the pharmacist at [facility] to verify because we had just sent in a 90 day fill on 11.23.20. Insurance will not pay for another 30 day supply until 12.15.20. When I called John Simmons back, states that he paid out of pocket for the 30 day fill on 11.23.20 and lost it on a trip out of town. Wants PCP to call pharmacy to approve early fill. Please advise

## 2019-04-06 ENCOUNTER — Other Ambulatory Visit: Payer: Self-pay | Admitting: Physician Assistant

## 2019-04-29 ENCOUNTER — Telehealth: Payer: Self-pay | Admitting: Physician Assistant

## 2019-04-29 MED ORDER — TADALAFIL 20 MG PO TABS: ORAL_TABLET | ORAL | 2 refills | Status: DC

## 2019-04-29 NOTE — Telephone Encounter (Signed)
Refill sent to pharmacy.   

## 2019-04-29 NOTE — Telephone Encounter (Signed)
Pt called in asking for a refill on the cialis pt uses [facility] pharmacy

## 2019-05-09 IMAGING — DX DG CHEST 2V
2 series · 2 of 2 positions shown · non-contrast
Comparison: Chest x-ray 12/15/2014.

CLINICAL DATA: 41-year-old male with history of left-sided neck
cramping and numbness radiating into the face for the past several
days.

EXAM:
CHEST  2 VIEW

[chest pa]
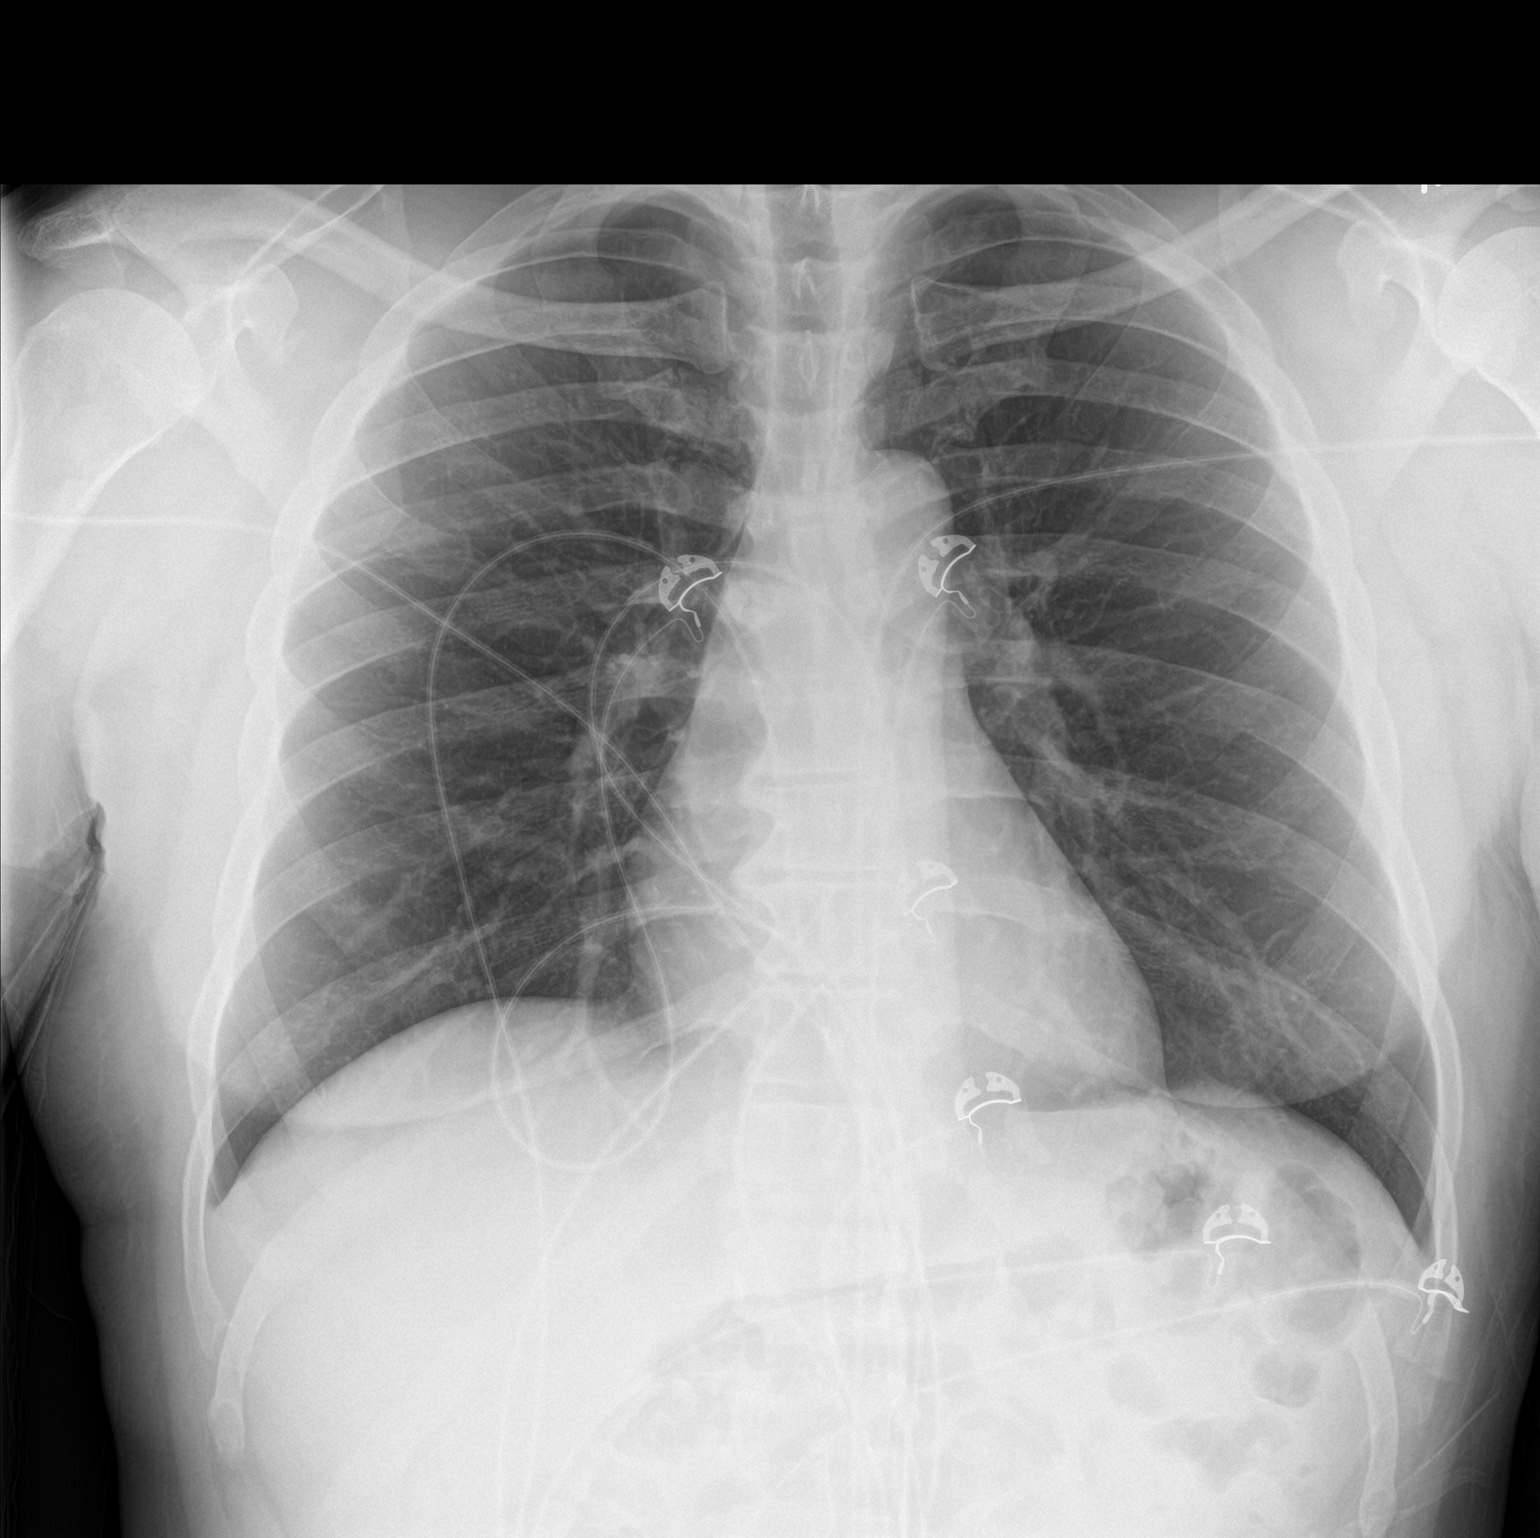

[chest lat]
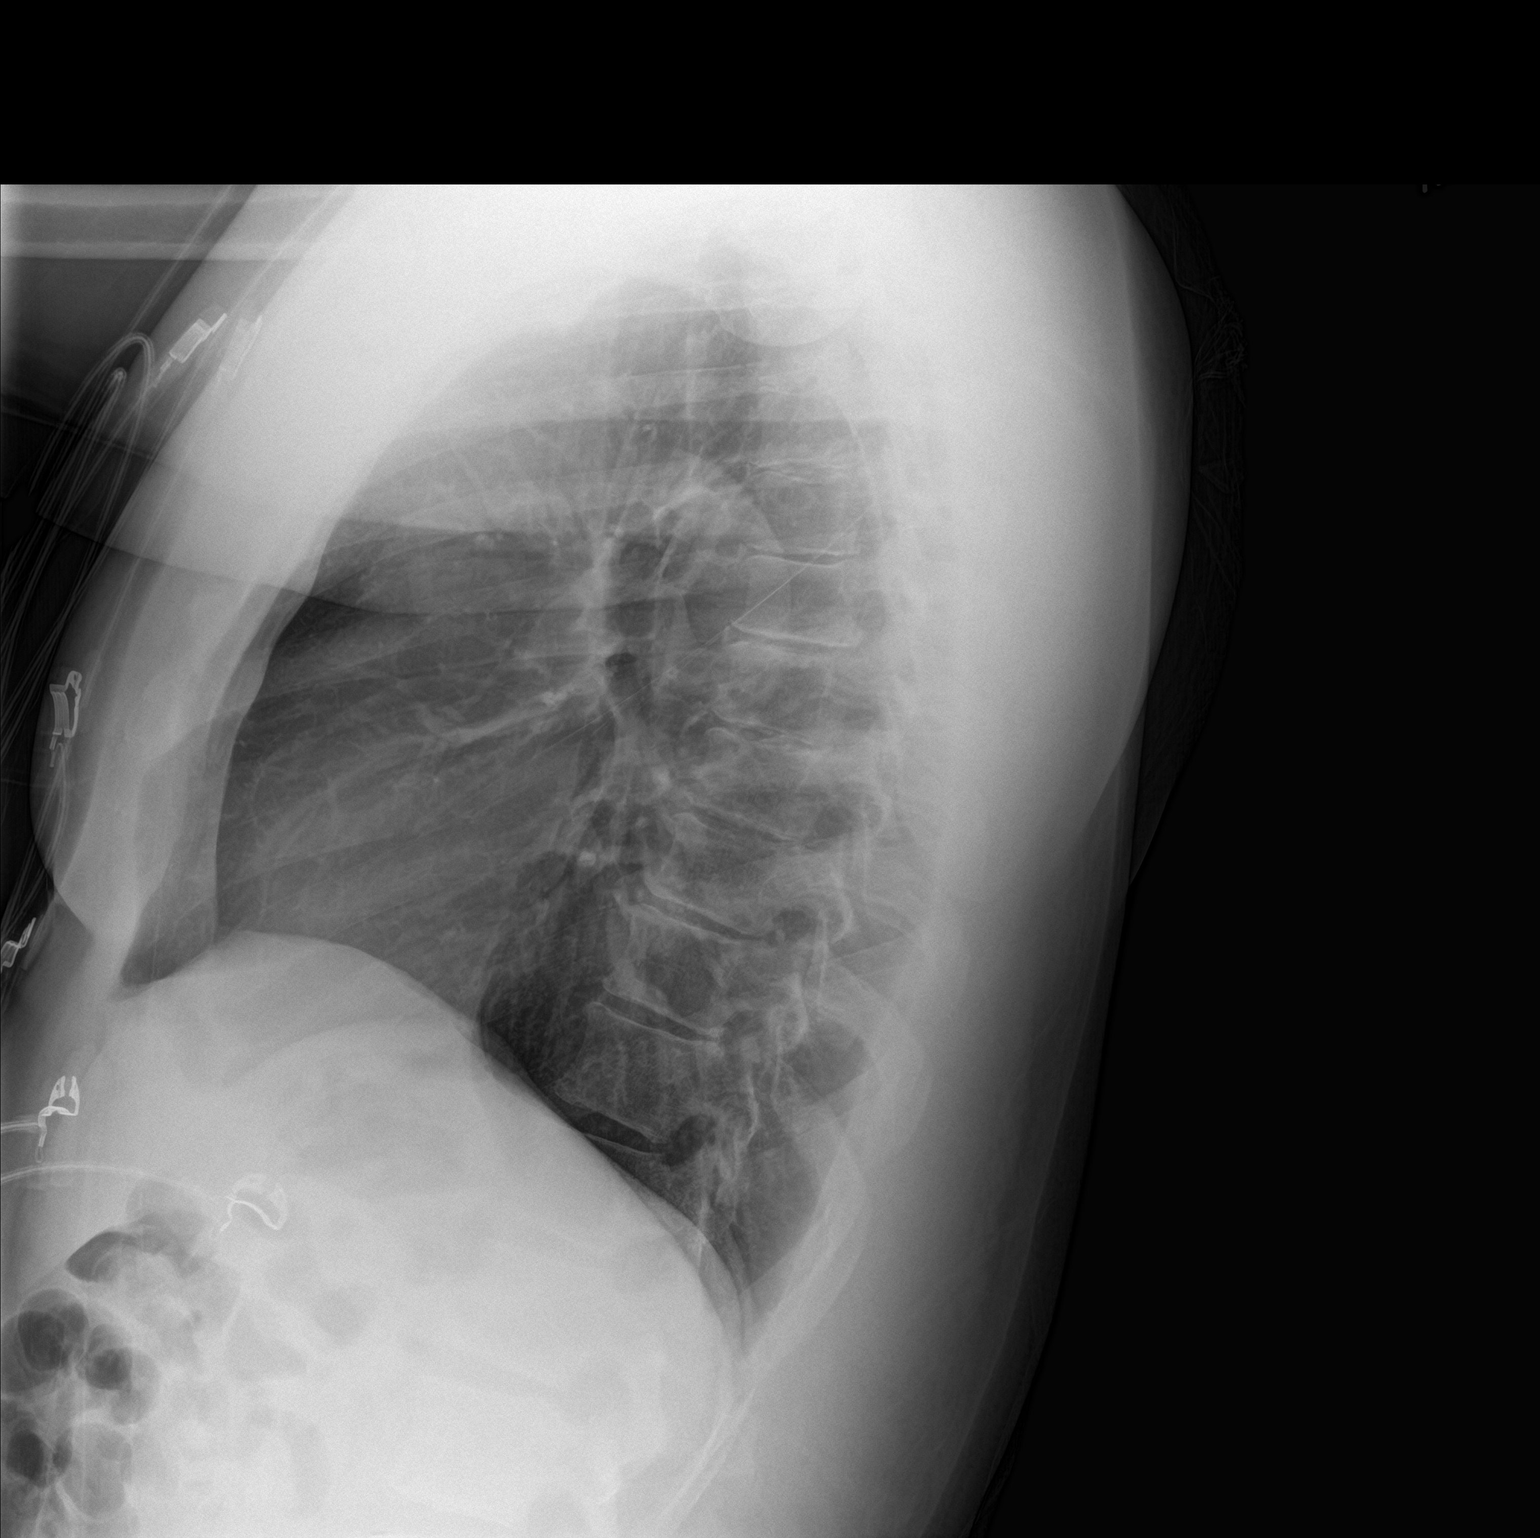

[2 of 2 positions shown; findings below may reference images not displayed]

FINDINGS: Lung volumes are normal. No consolidative airspace disease. No
pleural effusions. No pneumothorax. No pulmonary nodule or mass
noted. Pulmonary vasculature and the cardiomediastinal silhouette
are within normal limits.
IMPRESSION: No radiographic evidence of acute cardiopulmonary disease.

## 2019-05-23 ENCOUNTER — Telehealth: Payer: Self-pay | Admitting: *Deleted

## 2019-05-23 ENCOUNTER — Other Ambulatory Visit: Payer: Self-pay | Admitting: *Deleted

## 2019-05-23 ENCOUNTER — Other Ambulatory Visit: Payer: Self-pay | Admitting: Emergency Medicine

## 2019-05-23 DIAGNOSIS — I10 Essential (primary) hypertension: Secondary | ICD-10-CM

## 2019-05-23 MED ORDER — LISINOPRIL 40 MG PO TABS: 40.0000 mg | ORAL_TABLET | Freq: Every day | ORAL | 0 refills | Status: DC

## 2019-05-23 NOTE — Telephone Encounter (Signed)
Pt want to know if is ok to take Powder Nitric Oxcide Organic Beets with his HTN  medication  Please Advised

## 2019-05-23 NOTE — Telephone Encounter (Signed)
Rx lisinopril #30  refilled send to [facility]  Pt schedule F/U apt

## 2019-05-23 NOTE — Telephone Encounter (Signed)
In terms of interaction with blood pressure medication, would be okay to take both of them together, keeping close eye on blood pressure.  Nitrous oxide as a vasodilator can potentially have impact on lowering blood pressure so we may need to adjust dose of BP medication in the future. Of more importance, the NO can actually be an issue with his Cialis.  Typically you try to avoid excess nitrates (prescription) when using the medication like Cialis as it has the potential to really drop blood pressure.  This is impacting still" with natural nitrates, but unknown to what degree. Use with caution.

## 2019-05-26 ENCOUNTER — Other Ambulatory Visit: Payer: Self-pay | Admitting: Physician Assistant

## 2019-06-17 ENCOUNTER — Other Ambulatory Visit: Payer: Self-pay

## 2019-06-17 ENCOUNTER — Ambulatory Visit (INDEPENDENT_AMBULATORY_CARE_PROVIDER_SITE_OTHER): Payer: Managed Care, Other (non HMO) | Admitting: Physician Assistant

## 2019-06-17 ENCOUNTER — Encounter: Payer: Self-pay | Admitting: Physician Assistant

## 2019-06-17 DIAGNOSIS — E785 Hyperlipidemia, unspecified: Secondary | ICD-10-CM

## 2019-06-17 DIAGNOSIS — I10 Essential (primary) hypertension: Secondary | ICD-10-CM

## 2019-06-17 DIAGNOSIS — Z125 Encounter for screening for malignant neoplasm of prostate: Secondary | ICD-10-CM

## 2019-06-17 DIAGNOSIS — M1A9XX0 Chronic gout, unspecified, without tophus (tophi): Secondary | ICD-10-CM | POA: Diagnosis not present

## 2019-06-17 DIAGNOSIS — K219 Gastro-esophageal reflux disease without esophagitis: Secondary | ICD-10-CM

## 2019-06-17 NOTE — Progress Notes (Signed)
Virtual Visit via Video   I connected with patient on 06/17/19 at  3:30 PM EST by a video enabled telemedicine application and verified that I am speaking with the correct person using two identifiers.  Location patient: Home Location provider: [facility], Office Persons participating in the virtual visit: Patient, Provider, [place] (Patina Moore)  I discussed the limitations of evaluation and management by telemedicine and the availability of in person appointments. The patient expressed understanding and agreed to proceed.  Subjective:   HPI:   Patient presents via Doxy.Me today for follow-up of hypertension, GERD and Gout.   Hypertension -- Patient is currently on a regimen of amlodipine 10 mg QD and lisinopril 40 mg daily. Endorses taking as directed and tolerating well. Notes he had been checking BP at home with good numbers but his cuff broke in the past few weeks. Patient denies chest pain, palpitations, lightheadedness, dizziness, vision changes or frequent headaches. Is in need of refill of his lisinopril.  GERD -- Taking PPI daily as directed. Notes control of symptoms with this regimen. Avoiding trigger foods. Notes a big difference if he forgets to take his medication.  Gout -- No flare in quite some time. States he avoid trigger foods and alcohol. Has been out of medication for almost 1 month without issue. Overdue for recheck of uric acid.   ROS:   See pertinent positives and negatives per HPI.  Patient Active Problem List   Diagnosis Date Noted  . Chronic gastritis without bleeding 03/08/2018  . Non-intractable vomiting 03/08/2018  . Chronic gout involving toe of left foot without tophus 02/15/2018  . Hyperlipidemia 05/05/2016  . Prostate cancer screening 12/21/2015  . Visit for preventive health examination 12/21/2015  . Anxiety and depression 02/27/2015  . Essential hypertension, benign 03/08/2014  . GERD (gastroesophageal reflux disease) 03/08/2014      Social History   Tobacco Use  . Smoking status: Never Smoker  . Smokeless tobacco: Never Used  Substance Use Topics  . Alcohol use: Yes    Comment: occasionally    Current Outpatient Medications:  .  amLODipine (NORVASC) 10 MG tablet, Take 1 tablet by mouth once daily, Disp: 90 tablet, Rfl: 1 .  lisinopril (ZESTRIL) 40 MG tablet, Take 1 tablet (40 mg total) by mouth daily., Disp: 90 tablet, Rfl: 0 .  tadalafil (CIALIS) 20 MG tablet, 1 Tablet(s) By Mouth As Needed, Disp: 10 tablet, Rfl: 2 .  allopurinol (ZYLOPRIM) 300 MG tablet, Take 1 tablet (300 mg total) by mouth daily. (Patient not taking: Reported on 12/24/2018), Disp: 30 tablet, Rfl: 3 .  colchicine 0.6 MG tablet, Take 1 tablet (0.6 mg total) by mouth 2 (two) times daily. (Patient not taking: Reported on 06/17/2019), Disp: 14 tablet, Rfl: 0  No Known Allergies  Objective:   There were no vitals taken for this visit.  Patient is well-developed, well-nourished in no acute distress.  Resting comfortably at home.  Head is normocephalic, atraumatic.  No labored breathing.  Speech is clear and coherent with logical content.  Patient is alert and oriented at baseline.   Assessment and Plan:   1. Essential hypertension, benign Patient to get a new BP cuff at home to check levels periodically. Will continue current regimen for now. Future lab orders placed and lab appt scheduled next week. Will have CMA check manual BP at that time.  - Comprehensive metabolic panel; Future - Lipid panel; Future  2. Hyperlipidemia, unspecified hyperlipidemia type Appointment for fasting lipids placed to reassess.  -  Comprehensive metabolic panel; Future - Lipid panel; Future  3. Gastroesophageal reflux disease without esophagitis Stable. Continue current regimen.   4. Chronic gout involving toe of left foot without tophus, unspecified cause No flares. Will check uric acid level to assess how this is with him off of medication and determine if  we need to restart medication at same dose or consider lower.  - Uric acid; Future  5. Prostate cancer screening Overdue. Agreeable to PSA. Order placed.  - PSA; Future    Piedad Climes, PA-C 06/17/2019

## 2019-06-17 NOTE — Progress Notes (Signed)
I have discussed the procedure for the virtual visit with the patient who has given consent to proceed with assessment and treatment.   Jorgina Binning S Sundeep Destin, CMA     

## 2019-06-20 ENCOUNTER — Telehealth: Payer: Self-pay | Admitting: Physician Assistant

## 2019-06-20 NOTE — Telephone Encounter (Signed)
Pt called in asking for the lisinopril to be sent into the [facility] on [address]

## 2019-06-20 NOTE — Telephone Encounter (Signed)
Medication was sent in last month as a 90-day supply so he should not be out of medication. Please check with pharmacy to ensure they are dispensing a 90-day supply.

## 2019-06-22 ENCOUNTER — Other Ambulatory Visit: Payer: Self-pay

## 2019-06-22 ENCOUNTER — Ambulatory Visit (INDEPENDENT_AMBULATORY_CARE_PROVIDER_SITE_OTHER): Payer: Managed Care, Other (non HMO)

## 2019-06-22 ENCOUNTER — Other Ambulatory Visit: Payer: Self-pay | Admitting: Emergency Medicine

## 2019-06-22 DIAGNOSIS — M1A9XX0 Chronic gout, unspecified, without tophus (tophi): Secondary | ICD-10-CM | POA: Diagnosis not present

## 2019-06-22 DIAGNOSIS — I10 Essential (primary) hypertension: Secondary | ICD-10-CM | POA: Diagnosis not present

## 2019-06-22 DIAGNOSIS — E785 Hyperlipidemia, unspecified: Secondary | ICD-10-CM | POA: Diagnosis not present

## 2019-06-22 DIAGNOSIS — Z125 Encounter for screening for malignant neoplasm of prostate: Secondary | ICD-10-CM

## 2019-06-22 LAB — COMPREHENSIVE METABOLIC PANEL
ALT: 19 U/L (ref 0–53)
AST: 19 U/L (ref 0–37)
Albumin: 4 g/dL (ref 3.5–5.2)
Alkaline Phosphatase: 62 U/L (ref 39–117)
BUN: 14 mg/dL (ref 6–23)
CO2: 26 mEq/L (ref 19–32)
Calcium: 9.7 mg/dL (ref 8.4–10.5)
Chloride: 102 mEq/L (ref 96–112)
Creatinine, Ser: 1.17 mg/dL (ref 0.40–1.50)
GFR: 81.31 mL/min (ref >60.00)
Glucose, Bld: 101 mg/dL — ABNORMAL HIGH (ref 70–99)
Potassium: 4.2 mEq/L (ref 3.5–5.1)
Sodium: 136 mEq/L (ref 135–145)
Total Bilirubin: 0.5 mg/dL (ref 0.2–1.2)
Total Protein: 6.9 g/dL (ref 6.0–8.3)

## 2019-06-22 LAB — LIPID PANEL
Cholesterol: 234 mg/dL — ABNORMAL HIGH (ref 0–200)
HDL: 49.7 mg/dL (ref >39.00)
NonHDL: 183.95
Total CHOL/HDL Ratio: 5
Triglycerides: 230 mg/dL — ABNORMAL HIGH (ref 0.0–149.0)
VLDL: 46 mg/dL — ABNORMAL HIGH (ref 0.0–40.0)

## 2019-06-22 LAB — LDL CHOLESTEROL, DIRECT: Direct LDL: 134 mg/dL

## 2019-06-22 LAB — URIC ACID: Uric Acid, Serum: 8 mg/dL — ABNORMAL HIGH (ref 4.0–7.8)

## 2019-06-22 LAB — PSA: PSA: 0.68 ng/mL (ref 0.10–4.00)

## 2019-06-22 MED ORDER — LISINOPRIL 40 MG PO TABS: 40.0000 mg | ORAL_TABLET | Freq: Every day | ORAL | 1 refills | Status: DC

## 2019-06-22 NOTE — Telephone Encounter (Signed)
Another rx for Lisinopril sent to the pharmacy for 90 day supply. If patient is only getting 30 day supply of medication from the pharmacy then patient needs to check with his insurance if he needs to use a mail order pharmacy instead of  local pharmacy

## 2019-06-23 ENCOUNTER — Other Ambulatory Visit: Payer: Self-pay | Admitting: Emergency Medicine

## 2019-06-23 ENCOUNTER — Other Ambulatory Visit (INDEPENDENT_AMBULATORY_CARE_PROVIDER_SITE_OTHER): Payer: Managed Care, Other (non HMO)

## 2019-06-23 DIAGNOSIS — E785 Hyperlipidemia, unspecified: Secondary | ICD-10-CM

## 2019-06-23 DIAGNOSIS — R7309 Other abnormal glucose: Secondary | ICD-10-CM

## 2019-06-23 DIAGNOSIS — M1A9XX0 Chronic gout, unspecified, without tophus (tophi): Secondary | ICD-10-CM

## 2019-06-23 LAB — HEMOGLOBIN A1C: Hgb A1c MFr Bld: 5.7 % (ref 4.6–6.5)

## 2019-06-23 MED ORDER — ALLOPURINOL 300 MG PO TABS: 300.0000 mg | ORAL_TABLET | Freq: Every day | ORAL | 1 refills | Status: DC

## 2019-07-21 ENCOUNTER — Other Ambulatory Visit: Payer: Self-pay | Admitting: Physician Assistant

## 2019-07-21 ENCOUNTER — Telehealth: Payer: Self-pay

## 2019-07-21 MED ORDER — SILDENAFIL CITRATE 50 MG PO TABS: 25.0000 mg | ORAL_TABLET | ORAL | 2 refills | Status: DC | PRN

## 2019-07-21 NOTE — Telephone Encounter (Signed)
  LAST APPOINTMENT DATE: Visit date not found   NEXT APPOINTMENT DATE:@Visit  date not found  MEDICATION: Sildenafil   50MG   PHARMACY: [id number] - [place],  - [address] AT [facility] OF [address] Phone:  [phone]  Fax:  [phone]       **Let patient know to contact pharmacy at the end of the day to make sure medication is ready. **  ** Please notify patient to allow 48-72 hours to process**  **Encourage patient to contact the pharmacy for refills or they can request refills through [facility]**  CLINICAL FILLS OUT ALL BELOW:   LAST REFILL:  QTY:  REFILL DATE:    OTHER COMMENTS:    Okay for refill?  Please advise

## 2019-07-21 NOTE — Telephone Encounter (Signed)
It the Generic Viagra ok to refill for patient? LOV: 06/17/19 HTN

## 2019-07-21 NOTE — Telephone Encounter (Signed)
Medication was sent to the patient's pharmacy by PCP.

## 2019-07-21 NOTE — Telephone Encounter (Signed)
Pt called in asking for a refill on the sildenafil pt states that he has been on this medication for awhile and hasn't heard anything back from the office or the pharmacy. Please advise

## 2019-07-21 NOTE — Telephone Encounter (Signed)
Duplicate. See other note.

## 2019-07-27 ENCOUNTER — Telehealth: Payer: Self-pay | Admitting: Physician Assistant

## 2019-07-27 MED ORDER — COLCHICINE 0.6 MG PO TABS: 0.6000 mg | ORAL_TABLET | Freq: Two times a day (BID) | ORAL | 0 refills | Status: DC

## 2019-07-27 NOTE — Telephone Encounter (Signed)
Pt called in called in asking for a refill on the gout medication. He uses [facility] in [place]. Pt can be reached at the home#

## 2019-07-27 NOTE — Telephone Encounter (Signed)
I am ok sending in a refill to take as directed until flare resolves. If not calming down by end of the week he needs appointment for assessment. Continue the allopurinol, keep hydrated and avoid trigger foods.

## 2019-07-27 NOTE — Telephone Encounter (Signed)
He is still taking his Allopurinol daily. He ate shrimp 2 days ago and immediately started having pain of the right great toe. He has not had a flare up in a long time. He wanted the Colchicine for the gout pain

## 2019-08-01 ENCOUNTER — Telehealth: Payer: Self-pay | Admitting: Physician Assistant

## 2019-08-01 NOTE — Telephone Encounter (Signed)
Pt is calling because he is missing work because of the gout. His office ([employer]) faxed over a form for [place] to fill out and send back to them (this will help him keep his job).  He is going to try to go back on Thursday. He is taking the meds but they don't seem to be helping.  He has plenty of allopurinal left but is on the last day of the colchicine.  He can hardly walk because he is hurting so bad.  There is a lot of redness and swelling on the R foot, esp the big toe.   Please call pt and advise what he can do.  He wants to know if there are other meds that he can try - he said he can also do a virtual visit  (his car is in the shop).  Call back number - [phone]

## 2019-08-02 ENCOUNTER — Telehealth: Payer: Self-pay | Admitting: Physician Assistant

## 2019-08-02 NOTE — Telephone Encounter (Signed)
Patient scheduled for a virtual visit for 08/03/2019 with PCP.

## 2019-08-02 NOTE — Telephone Encounter (Signed)
He has not been seen so would have to have visit to further assess symptoms/change treatment and also for me to consider completing forms.

## 2019-08-02 NOTE — Telephone Encounter (Signed)
I have placed an attending providers statement in the bin upfront with a charge sheet.

## 2019-08-02 NOTE — Telephone Encounter (Signed)
Please advise 

## 2019-08-03 ENCOUNTER — Other Ambulatory Visit: Payer: Self-pay

## 2019-08-03 ENCOUNTER — Telehealth (INDEPENDENT_AMBULATORY_CARE_PROVIDER_SITE_OTHER): Payer: Managed Care, Other (non HMO) | Admitting: Physician Assistant

## 2019-08-03 ENCOUNTER — Encounter: Payer: Self-pay | Admitting: Physician Assistant

## 2019-08-03 DIAGNOSIS — M109 Gout, unspecified: Secondary | ICD-10-CM | POA: Diagnosis not present

## 2019-08-03 MED ORDER — PREDNISONE 10 MG PO TABS: ORAL_TABLET | ORAL | 0 refills | Status: AC

## 2019-08-03 NOTE — Progress Notes (Signed)
I have discussed the procedure for the virtual visit with the patient who has given consent to proceed with assessment and treatment.   Chastin Riesgo S Shiryl Ruddy, CMA     

## 2019-08-03 NOTE — Telephone Encounter (Signed)
Paperwork in your folder for review and completion 

## 2019-08-03 NOTE — Progress Notes (Signed)
   Virtual Visit via Video   I connected with patient on 08/03/19 at 11:00 AM EDT by a video enabled telemedicine application and verified that I am speaking with the correct person using two identifiers.  Location patient: Home Location provider: [facility], [facility] Persons participating in the virtual visit: Patient, Provider, CMA (Patina Moore)  I discussed the limitations of evaluation and management by telemedicine and the availability of in person appointments. The patient expressed understanding and agreed to proceed.  Subjective:   HPI:   Patient presents via [facility] today c/o flare up of gout since 07/27/19. Notes symptoms of R great toe starting that evening after having hibachi shrimp and a mixed drink. Noted very slight symptoms that progressed over the next 48 hours. Has been taking his allopurinol daily without fail. Restarted his cholchicine with some improvement but ran out of medication. As such symptoms worsened. Denies fever, chills. Is able to bend the toes but with pain. Has been keeping off of his foot as much as possible. Has been out of work for the past 2 days.   ROS:   See pertinent positives and negatives per HPI.  Patient Active Problem List   Diagnosis Date Noted  . Chronic gout involving toe of left foot without tophus 02/15/2018  . Hyperlipidemia 05/05/2016  . Prostate cancer screening 12/21/2015  . Visit for preventive health examination 12/21/2015  . Essential hypertension, benign 03/08/2014  . GERD (gastroesophageal reflux disease) 03/08/2014    Social History   Tobacco Use  . Smoking status: Never Smoker  . Smokeless tobacco: Never Used  Substance Use Topics  . Alcohol use: Yes    Comment: occasionally    Current Outpatient Medications:  .  allopurinol (ZYLOPRIM) 300 MG tablet, Take 1 tablet (300 mg total) by mouth daily., Disp: 90 tablet, Rfl: 1 .  amLODipine (NORVASC) 10 MG tablet, Take 1 tablet by mouth once daily, Disp: 90  tablet, Rfl: 1 .  lisinopril (ZESTRIL) 40 MG tablet, Take 1 tablet (40 mg total) by mouth daily., Disp: 90 tablet, Rfl: 1 .  sildenafil (VIAGRA) 50 MG tablet, Take 0.5 tablets (25 mg total) by mouth as needed for erectile dysfunction., Disp: 10 tablet, Rfl: 2 .  tadalafil (CIALIS) 20 MG tablet, 1 Tablet(s) By Mouth As Needed, Disp: 10 tablet, Rfl: 2 .  colchicine 0.6 MG tablet, Take 1 tablet (0.6 mg total) by mouth 2 (two) times daily. (Patient not taking: Reported on 08/03/2019), Disp: 14 tablet, Rfl: 0  No Known Allergies  Objective:   There were no vitals taken for this visit.  Patient is well-developed, well-nourished in no acute distress.  Resting comfortably at home.  Head is normocephalic, atraumatic.  No labored breathing.  Speech is clear and coherent with logical content.  Patient is alert and oriented at baseline.  R MTP joint visualized on video. Erythematous and swollen. Patient able to move toes freely, albeit with some pain.   Assessment and Plan:   1. Acute gout of right foot, unspecified cause Colchicine with some improvement. Will start Prednisone taper starting at 50 mg. Supportive measures and OTC medications reviewed. Will keep out of work today, tomorrow and Friday to let things resolve. Strict return and ER precautions reviewed with patient.     Piedad Climes, PA-C 08/03/2019

## 2019-08-03 NOTE — Telephone Encounter (Signed)
Paperwork reviewed, completed and signed. Need OV notes from 4/14 and med list printed and attached -- then ready for fax. No charge as patient had OV.

## 2019-08-04 NOTE — Telephone Encounter (Signed)
Printed last OV to fax with additional forms to the [employer]

## 2019-08-08 ENCOUNTER — Other Ambulatory Visit: Payer: Self-pay | Admitting: Emergency Medicine

## 2019-08-08 DIAGNOSIS — I10 Essential (primary) hypertension: Secondary | ICD-10-CM

## 2019-08-08 MED ORDER — AMLODIPINE BESYLATE 10 MG PO TABS: 10.0000 mg | ORAL_TABLET | Freq: Every day | ORAL | 1 refills | Status: DC

## 2019-09-30 ENCOUNTER — Other Ambulatory Visit: Payer: Self-pay | Admitting: Physician Assistant

## 2019-09-30 MED ORDER — TADALAFIL 20 MG PO TABS: ORAL_TABLET | ORAL | 2 refills | Status: DC

## 2019-10-19 ENCOUNTER — Telehealth: Payer: Self-pay | Admitting: Physician Assistant

## 2019-10-19 DIAGNOSIS — I10 Essential (primary) hypertension: Secondary | ICD-10-CM

## 2019-10-19 MED ORDER — LISINOPRIL 40 MG PO TABS: 40.0000 mg | ORAL_TABLET | Freq: Every day | ORAL | 0 refills | Status: DC

## 2019-10-19 NOTE — Telephone Encounter (Signed)
Pt called in asking for a refill on the Lisinopril. He states that he never received a 90 day supply for the pharmacy. Pt uses the [facility] on [address] in [place]. Pt can be reached at the home #

## 2019-10-19 NOTE — Telephone Encounter (Signed)
Medication has been sent to the patient's pharmacy.  

## 2019-12-02 ENCOUNTER — Other Ambulatory Visit: Payer: Self-pay | Admitting: Physician Assistant

## 2019-12-02 DIAGNOSIS — I10 Essential (primary) hypertension: Secondary | ICD-10-CM

## 2019-12-02 NOTE — Telephone Encounter (Signed)
Pt called in asking for a refill on the Lisinopril, pt states that while traveling he lost some of his medication and he is now out of the Lisinopril. Please advise   Pt uses [facility] on [address]

## 2019-12-02 NOTE — Telephone Encounter (Signed)
Please advise if ok to fill. It appears as his last BP follow up was march of this year.

## 2019-12-23 ENCOUNTER — Other Ambulatory Visit: Payer: Self-pay | Admitting: Physician Assistant

## 2019-12-23 DIAGNOSIS — I10 Essential (primary) hypertension: Secondary | ICD-10-CM

## 2019-12-23 MED ORDER — LISINOPRIL 40 MG PO TABS: ORAL_TABLET | ORAL | 0 refills | Status: DC

## 2019-12-23 NOTE — Telephone Encounter (Signed)
Patient had his lisinopril (blood pressure medicine) filled and he has lost them.  He thinks they may have been thrown away - Could they be called in again to [facility] - [address] - [place]?  Please advise -

## 2019-12-23 NOTE — Telephone Encounter (Signed)
Please advise?  Lisinopril last filled 8/13/*21 #90 with 0

## 2019-12-23 NOTE — Telephone Encounter (Signed)
Can give 30-day only. He is overdue for follow-up for HTN and will need to schedule this.

## 2019-12-30 ENCOUNTER — Ambulatory Visit: Payer: Managed Care, Other (non HMO) | Admitting: Physician Assistant

## 2020-01-14 ENCOUNTER — Encounter (HOSPITAL_BASED_OUTPATIENT_CLINIC_OR_DEPARTMENT_OTHER): Payer: Self-pay | Admitting: *Deleted

## 2020-01-14 ENCOUNTER — Emergency Department (HOSPITAL_BASED_OUTPATIENT_CLINIC_OR_DEPARTMENT_OTHER)
Admission: EM | Admit: 2020-01-14 | Discharge: 2020-01-15 | Disposition: A | Discharge status: Home / Self Care | Payer: Managed Care, Other (non HMO) | Attending: Emergency Medicine | Admitting: Emergency Medicine

## 2020-01-14 ENCOUNTER — Other Ambulatory Visit: Payer: Self-pay

## 2020-01-14 DIAGNOSIS — Z79899 Other long term (current) drug therapy: Secondary | ICD-10-CM | POA: Diagnosis not present

## 2020-01-14 DIAGNOSIS — Z8546 Personal history of malignant neoplasm of prostate: Secondary | ICD-10-CM | POA: Insufficient documentation

## 2020-01-14 DIAGNOSIS — I10 Essential (primary) hypertension: Secondary | ICD-10-CM | POA: Diagnosis not present

## 2020-01-14 DIAGNOSIS — Z0130 Encounter for examination of blood pressure without abnormal findings: Secondary | ICD-10-CM

## 2020-01-14 NOTE — ED Triage Notes (Signed)
Pt reports feeling dizzy at work today around 1 pm. States he is out of his BP meds x 2 days and his doctor is out of town. Requesting refill for medications

## 2020-01-14 NOTE — ED Provider Notes (Signed)
[facility] EMERGENCY DEPARTMENT Provider Note   CSN: [id number] Arrival date & time: 01/14/20  1902     History Chief Complaint  Patient presents with  . Hypertension    John Simmons is a 44 y.o. male.  The history is provided by the patient.  Hypertension This is a chronic problem. The problem occurs constantly. The problem has not changed since onset.Pertinent negatives include no chest pain, no abdominal pain, no headaches and no shortness of breath. Nothing aggravates the symptoms. Nothing relieves the symptoms. He has tried nothing for the symptoms. The treatment provided no relief.  Patient is out of his medication for 2 days.  He is here for a refill.  BP is normal here.       Past Medical History:  Diagnosis Date  . Blood transfusion without reported diagnosis    for anemia late 20's or early 30's  . Chronic neck pain   . Colon polyp   . GERD (gastroesophageal reflux disease)   . Gout   . Hyperlipidemia   . Hypertension     Patient Active Problem List   Diagnosis Date Noted  . Chronic gout involving toe of left foot without tophus 02/15/2018  . Hyperlipidemia 05/05/2016  . Prostate cancer screening 12/21/2015  . Visit for preventive health examination 12/21/2015  . Essential hypertension, benign 03/08/2014  . GERD (gastroesophageal reflux disease) 03/08/2014    Past Surgical History:  Procedure Laterality Date  . ANKLE FRACTURE SURGERY Right   . COLONOSCOPY     around 2014 or 2015  . ESOPHAGOGASTRODUODENOSCOPY     more than 10 years ago  . HEMORRHOID BANDING  2016  . negative         Family History  Problem Relation Age of Onset  . Hypertension Mother   . Diabetes Mother   . Hypertension Father   . Colonic polyp Paternal Grandfather   . Esophageal cancer Neg Hx   . Colon cancer Neg Hx     Social History   Tobacco Use  . Smoking status: Never Smoker  . Smokeless tobacco: Never Used  Vaping Use  . Vaping Use: Never used    Substance Use Topics  . Alcohol use: Yes    Comment: occasionally  . Drug use: No    Home Medications Prior to Admission medications   Medication Sig Start Date End Date Taking? Authorizing Provider  amLODipine (NORVASC) 10 MG tablet Take 1 tablet (10 mg total) by mouth daily. 08/08/19  Yes Waldon Merl, PA-C  lisinopril (ZESTRIL) 40 MG tablet TAKE 1 TABLET(40 MG) BY MOUTH DAILY. Due for appointment for more refills. Call to schedule 12/23/19  Yes Waldon Merl, PA-C  allopurinol (ZYLOPRIM) 300 MG tablet Take 1 tablet (300 mg total) by mouth daily. 06/23/19   Waldon Merl, PA-C  tadalafil (CIALIS) 20 MG tablet 1 Tablet(s) By Mouth As Needed 09/30/19   Waldon Merl, PA-C    Allergies    Patient has no known allergies.  Review of Systems   Review of Systems  Constitutional: Negative for fever.  HENT: Negative for congestion.   Eyes: Negative for visual disturbance.  Respiratory: Negative for shortness of breath.   Cardiovascular: Negative for chest pain.  Gastrointestinal: Negative for abdominal pain.  Genitourinary: Negative for difficulty urinating.  Musculoskeletal: Negative for arthralgias.  Neurological: Negative for facial asymmetry, weakness and headaches.  Psychiatric/Behavioral: Negative for agitation.  All other systems reviewed and are negative.   Physical Exam Updated  Vital Signs BP (!) 138/103 (BP Location: Left Arm)   Pulse 69   Temp 98 F (36.7 C) (Oral)   Resp 18   Ht 5\' 9"  (1.753 m)   Wt 103 kg   SpO2 99%   BMI 33.52 kg/m   Physical Exam Vitals and nursing note reviewed.  Constitutional:      General: He is not in acute distress.    Appearance: Normal appearance.  HENT:     Head: Normocephalic and atraumatic.     Nose: Nose normal.  Eyes:     Conjunctiva/sclera: Conjunctivae normal.     Pupils: Pupils are equal, round, and reactive to light.  Cardiovascular:     Rate and Rhythm: Normal rate and regular rhythm.     Pulses: Normal  pulses.     Heart sounds: Normal heart sounds.  Pulmonary:     Effort: Pulmonary effort is normal.     Breath sounds: Normal breath sounds.  Abdominal:     General: Abdomen is flat. Bowel sounds are normal.     Palpations: Abdomen is soft.  Musculoskeletal:        General: Normal range of motion.     Cervical back: Normal range of motion and neck supple.     Right lower leg: No edema.     Left lower leg: No edema.  Skin:    General: Skin is warm and dry.     Capillary Refill: Capillary refill takes less than 2 seconds.  Neurological:     General: No focal deficit present.     Mental Status: He is alert and oriented to person, place, and time.     Deep Tendon Reflexes: Reflexes normal.  Psychiatric:        Mood and Affect: Mood normal.        Behavior: Behavior normal.     ED Results / Procedures / Treatments   Labs (all labs ordered are listed, but only abnormal results are displayed) Labs Reviewed - No data to display  EKG None  Radiology No results found.  Procedures Procedures (including critical care time)  Medications Ordered in ED Medications - No data to display  ED Course  I have reviewed the triage vital signs and the nursing notes.  Pertinent labs & imaging results that were available during my care of the patient were reviewed by me and considered in my medical decision making (see chart for details).    BP is consistently in 120-130s here.  I do not believe this patient needs BP medication and I believe I would be doing harm by putting him on BP medication at this level and I will not do this.  I have explained this to the patient at length, I explained that given the current level he would very likely drop too plow with his pressure and this could cause many serious problems like passing out.  I also apologized for his wait in the department.  I am not sure if something in his life has changed since he was started on medication by his PMD but I recommend  a re-evaluation by PMD prior to restarting medication.  Based on the JNC 7 there is no indication to treat a BP that is in normal range.    John Simmons was evaluated in Emergency Department on 01/14/2020 for the symptoms described in the history of present illness. He was evaluated in the context of the global COVID-19 pandemic, which necessitated consideration that the patient might  be at risk for infection with the SARS-CoV-2 virus that causes COVID-19. Institutional protocols and algorithms that pertain to the evaluation of patients at risk for COVID-19 are in a state of rapid change based on information released by regulatory bodies including the CDC and federal and state organizations. These policies and algorithms were followed during the patient's care in the ED.   Final Clinical Impression(s) / ED Diagnoses Return for intractable cough, coughing up blood,fevers >100.4 unrelieved by medication, shortness of breath, intractable vomiting, chest pain, shortness of breath, weakness,numbness, changes in speech, facial asymmetry,abdominal pain, passing out,Inability to tolerate liquids or food, cough, altered mental status or any concerns. No signs of systemic illness or infection. The patient is nontoxic-appearing on exam and vital signs are within normal limits.   I have reviewed the triage vital signs and the nursing notes. Pertinent labs &imaging results that were available during my care of the patient were reviewed by me and considered in my medical decision making (see chart for details).After history, exam, and medical workup I feel the patient has beenappropriately medically screened and is safe for discharge home. Pertinent diagnoses were discussed with the patient. Patient was given return precautions.    Audrionna Lampton, MD 01/14/20 2317

## 2020-01-16 ENCOUNTER — Telehealth: Payer: Self-pay | Admitting: Physician Assistant

## 2020-01-16 NOTE — Telephone Encounter (Signed)
Patient would like his blood pressure medication refilled - [facility] - [address], [place] - Needs his lisonopril refilled.

## 2020-01-17 ENCOUNTER — Other Ambulatory Visit: Payer: Self-pay

## 2020-01-17 ENCOUNTER — Emergency Department (HOSPITAL_BASED_OUTPATIENT_CLINIC_OR_DEPARTMENT_OTHER)
Admission: EM | Admit: 2020-01-17 | Discharge: 2020-01-17 | Disposition: A | Discharge status: Home / Self Care | Payer: Managed Care, Other (non HMO) | Attending: Emergency Medicine | Admitting: Emergency Medicine

## 2020-01-17 ENCOUNTER — Encounter (HOSPITAL_BASED_OUTPATIENT_CLINIC_OR_DEPARTMENT_OTHER): Payer: Self-pay | Admitting: *Deleted

## 2020-01-17 DIAGNOSIS — Z79899 Other long term (current) drug therapy: Secondary | ICD-10-CM | POA: Insufficient documentation

## 2020-01-17 DIAGNOSIS — M549 Dorsalgia, unspecified: Secondary | ICD-10-CM | POA: Diagnosis present

## 2020-01-17 DIAGNOSIS — M545 Low back pain, unspecified: Secondary | ICD-10-CM

## 2020-01-17 DIAGNOSIS — I10 Essential (primary) hypertension: Secondary | ICD-10-CM | POA: Diagnosis not present

## 2020-01-17 MED ORDER — IBUPROFEN 800 MG PO TABS: 800.0000 mg | ORAL_TABLET | Freq: Three times a day (TID) | ORAL | 0 refills | Status: DC | PRN

## 2020-01-17 MED ORDER — CYCLOBENZAPRINE HCL 10 MG PO TABS: 10.0000 mg | ORAL_TABLET | Freq: Two times a day (BID) | ORAL | 0 refills | Status: DC | PRN

## 2020-01-17 MED FILL — CYCLOBENZAPRINE HCL 10 MG T: 10 | 10 days supply | Qty: 20 | Fill #0

## 2020-01-17 MED FILL — IBUPROFEN 800 MG TAB: 800 | 10 days supply | Qty: 30 | Fill #0

## 2020-01-17 NOTE — Discharge Instructions (Addendum)
Recommend 800 mg of ibuprofen every 8 hours for the next 5 days and then as needed, recommend 650 mg of Tylenol every 4-6 hours for the next 5 days and then as needed, use Flexeril as needed for muscle spasm but do not mix with alcohol or drugs.

## 2020-01-17 NOTE — Telephone Encounter (Signed)
In reviewing patient chart. Patient went to ER on 01/14/20 for blood pressure. Blood pressure was in normal range and has been. Per ER note no medication refilled due to blood pressure in normal range. Patient needs a follow up appointment with PCP to evaluate restarting medication.  Please advise  Patient is currently at ED now
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
## 2020-01-17 NOTE — Telephone Encounter (Signed)
Max dose of lisinopril is 40 mg -- received note from pharmacy where he noted he was taking 2 per day. Should not have been taking more than 1 per day. I had previously refilled his medication as he called in stating he had lost his bottle. I would call patient to see if he has been taking his medication but just ran out again and that is why BP was normal at ER visit. He needs follow-up with me so we can get this clarified and taken care of.

## 2020-01-17 NOTE — ED Triage Notes (Signed)
Lower back pain 

## 2020-01-17 NOTE — Telephone Encounter (Signed)
Patient called again - he said that he is completely out of his blood pressure medication - he wanted to make an appointment for today, but we didn't have any openings.

## 2020-01-17 NOTE — ED Notes (Signed)
ED Provider at bedside. 

## 2020-01-19 ENCOUNTER — Other Ambulatory Visit: Payer: Self-pay

## 2020-01-19 DIAGNOSIS — I10 Essential (primary) hypertension: Secondary | ICD-10-CM

## 2020-01-19 MED ORDER — LISINOPRIL 40 MG PO TABS: ORAL_TABLET | ORAL | 0 refills | Status: DC

## 2020-01-19 NOTE — Telephone Encounter (Signed)
Okay to give 30-day of the lisinopril 40 mg --1 tablet p.o. daily.  No further refills without follow-up

## 2020-01-19 NOTE — Telephone Encounter (Signed)
Called patient in reference to medication. Patient states he had only taken 2 of his lisinopril after he was advised to do so by hospital staff. He states that was only for a couple of days until his bp was stable. Other than that, patient states he was taking 1 a day. Since loosing a bottle of medication, that has thrown him off and he is currently out of medication. He states he is going to make an appointment to come in October, but due to starting a new job, he has to try and get the time off. He states he will definitely be calling the office back once he finds out when he can be off. Okay to call in a 30 day supply of Lisinopril to patient's [facility] pharmacy on [address]? Please advise.

## 2020-01-19 NOTE — Telephone Encounter (Signed)
Medication sent to patient's pharmacy.

## 2020-01-20 ENCOUNTER — Other Ambulatory Visit: Payer: Self-pay | Admitting: Physician Assistant

## 2020-01-20 DIAGNOSIS — I10 Essential (primary) hypertension: Secondary | ICD-10-CM

## 2020-01-20 NOTE — Telephone Encounter (Signed)
Ok for 30 day. No further refills until he comes in for an appointment.

## 2020-01-20 NOTE — Telephone Encounter (Signed)
Amlodipine LFD 08/08/19 #90 with 1 refill LOV 08/03/19 Okay to fill for 30 day?

## 2020-01-21 ENCOUNTER — Other Ambulatory Visit: Payer: Self-pay | Admitting: Physician Assistant

## 2020-01-22 ENCOUNTER — Emergency Department (HOSPITAL_BASED_OUTPATIENT_CLINIC_OR_DEPARTMENT_OTHER)
Admission: EM | Admit: 2020-01-22 | Discharge: 2020-01-22 | Disposition: A | Discharge status: Home / Self Care | Payer: Managed Care, Other (non HMO) | Attending: Emergency Medicine | Admitting: Emergency Medicine

## 2020-01-22 ENCOUNTER — Encounter (HOSPITAL_BASED_OUTPATIENT_CLINIC_OR_DEPARTMENT_OTHER): Payer: Self-pay | Admitting: Emergency Medicine

## 2020-01-22 ENCOUNTER — Other Ambulatory Visit: Payer: Self-pay

## 2020-01-22 DIAGNOSIS — M10071 Idiopathic gout, right ankle and foot: Secondary | ICD-10-CM | POA: Insufficient documentation

## 2020-01-22 DIAGNOSIS — I10 Essential (primary) hypertension: Secondary | ICD-10-CM | POA: Diagnosis not present

## 2020-01-22 DIAGNOSIS — R030 Elevated blood-pressure reading, without diagnosis of hypertension: Secondary | ICD-10-CM

## 2020-01-22 DIAGNOSIS — Z79899 Other long term (current) drug therapy: Secondary | ICD-10-CM | POA: Insufficient documentation

## 2020-01-22 MED ORDER — PREDNISONE 50 MG PO TABS
60.0000 mg | ORAL_TABLET | Freq: Once | ORAL | Status: AC
  Administered 2020-01-22: 60 mg via ORAL
  Filled 2020-01-22: qty 1

## 2020-01-22 MED ORDER — ACETAMINOPHEN 500 MG PO TABS
1000.0000 mg | ORAL_TABLET | Freq: Once | ORAL | Status: AC
  Administered 2020-01-22: 1000 mg via ORAL
  Filled 2020-01-22: qty 2

## 2020-01-22 MED ORDER — PREDNISONE 20 MG PO TABS: ORAL_TABLET | ORAL | 0 refills | Status: DC

## 2020-01-22 NOTE — ED Triage Notes (Signed)
Pt c/o right great toe pain onset Thursday. Pt has history of gout.

## 2020-01-22 NOTE — ED Provider Notes (Signed)
[facility] EMERGENCY DEPARTMENT Provider Note   CSN: [id number] Arrival date & time: 01/22/20  3154     History Chief Complaint  Patient presents with  . Gout    John Simmons is a 44 y.o. male.  Patient with hx gout, c/o 'gout flare' in right great toe in the past day. States yesterday AM, acute onset pain at right 1st MTP region. Same as prior gout pain. No injury to area. No skin changes or redness. Symptoms mod-severe, constant, dull, worse w palpation/pressure/touch to area. No fever or chills. No other joint/body pain. States otherwise does not feel sick or ill. No recent change in meds or foods.   The history is provided by the patient.       Past Medical History:  Diagnosis Date  . Blood transfusion without reported diagnosis    for anemia late 20's or early 30's  . Chronic neck pain   . Colon polyp   . GERD (gastroesophageal reflux disease)   . Gout   . Hyperlipidemia   . Hypertension     Patient Active Problem List   Diagnosis Date Noted  . Chronic gout involving toe of left foot without tophus 02/15/2018  . Hyperlipidemia 05/05/2016  . Prostate cancer screening 12/21/2015  . Visit for preventive health examination 12/21/2015  . Essential hypertension, benign 03/08/2014  . GERD (gastroesophageal reflux disease) 03/08/2014    Past Surgical History:  Procedure Laterality Date  . ANKLE FRACTURE SURGERY Right   . COLONOSCOPY     around 2014 or 2015  . ESOPHAGOGASTRODUODENOSCOPY     more than 10 years ago  . HEMORRHOID BANDING  2016  . negative         Family History  Problem Relation Age of Onset  . Hypertension Mother   . Diabetes Mother   . Hypertension Father   . Colonic polyp Paternal Grandfather   . Esophageal cancer Neg Hx   . Colon cancer Neg Hx     Social History   Tobacco Use  . Smoking status: Never Smoker  . Smokeless tobacco: Never Used  Vaping Use  . Vaping Use: Never used  Substance Use Topics  . Alcohol  use: Yes    Comment: occasionally  . Drug use: No    Home Medications Prior to Admission medications   Medication Sig Start Date End Date Taking? Authorizing Provider  allopurinol (ZYLOPRIM) 300 MG tablet Take 1 tablet (300 mg total) by mouth daily. 06/23/19   Waldon Merl, PA-C  amLODipine (NORVASC) 10 MG tablet Take 1 tablet (10 mg total) by mouth daily. 08/08/19   Waldon Merl, PA-C  cyclobenzaprine (FLEXERIL) 10 MG tablet Take 1 tablet (10 mg total) by mouth 2 (two) times daily as needed for muscle spasms. 01/17/20   Curatolo, Adam, DO  ibuprofen (ADVIL) 800 MG tablet Take 1 tablet (800 mg total) by mouth every 8 (eight) hours as needed for up to 30 doses. 01/17/20   Curatolo, Adam, DO  lisinopril (ZESTRIL) 40 MG tablet TAKE 1 TABLET(40 MG) BY MOUTH DAILY. Due for appointment for more refills. Call to schedule 01/19/20   Waldon Merl, PA-C  tadalafil (CIALIS) 20 MG tablet 1 Tablet(s) By Mouth As Needed 09/30/19   Waldon Merl, PA-C    Allergies    Patient has no known allergies.  Review of Systems   Review of Systems  Constitutional: Negative for chills and fever.  Gastrointestinal: Negative for nausea and vomiting.  Musculoskeletal:  Right 1st MTP/great toe pain  Skin: Negative for rash.  Neurological: Negative for numbness.    Physical Exam Updated Vital Signs BP (!) 140/104   Pulse 76   Temp 98.1 F (36.7 C) (Oral)   Resp 18   Ht 1.753 m (5\' 9" )   Wt 103 kg   SpO2 100%   BMI 33.52 kg/m   Physical Exam Vitals and nursing note reviewed.  Constitutional:      Appearance: Normal appearance. He is well-developed.  HENT:     Head: Atraumatic.     Nose: Nose normal.     Mouth/Throat:     Mouth: Mucous membranes are moist.     Pharynx: Oropharynx is clear.  Eyes:     General: No scleral icterus.    Conjunctiva/sclera: Conjunctivae normal.  Neck:     Trachea: No tracheal deviation.  Cardiovascular:     Rate and Rhythm: Normal rate.      Pulses: Normal pulses.  Pulmonary:     Effort: Pulmonary effort is normal. No accessory muscle usage or respiratory distress.  Genitourinary:    Comments: No cva tenderness. Musculoskeletal:     Cervical back: Neck supple.     Comments: Tenderness right 1st mtp region, ?mild swelling. No redness. Dp/pt 2+. Normal cap refill distally in toe. No cellulitis.   Skin:    General: Skin is warm and dry.     Findings: No rash.  Neurological:     Mental Status: He is alert.     Comments: Alert, speech clear.   Psychiatric:        Mood and Affect: Mood normal.     ED Results / Procedures / Treatments   Labs (all labs ordered are listed, but only abnormal results are displayed) Labs Reviewed - No data to display  EKG None  Radiology No results found.  Procedures Procedures (including critical care time)  Medications Ordered in ED Medications  predniSONE (DELTASONE) tablet 60 mg (has no administration in time range)  acetaminophen (TYLENOL) tablet 1,000 mg (has no administration in time range)    ED Course  I have reviewed the triage vital signs and the nursing notes.  Pertinent labs & imaging results that were available during my care of the patient were reviewed by me and considered in my medical decision making (see chart for details).    MDM Rules/Calculators/A&P                         Pt notes symptoms same as w prior gout. States prednisone worked well in past.  Reviewed nursing notes and prior charts for additional history.   Prednisone po. Acetaminophen po.  Rec pcp f/u for gout as well as for htn.   Return precautions provided.   Final Clinical Impression(s) / ED Diagnoses Final diagnoses:  None    Rx / DC Orders ED Discharge Orders    None       , MD 01/22/20 0900

## 2020-01-22 NOTE — Discharge Instructions (Addendum)
It was our pleasure to provide your ER care today - we hope that you feel better.  Take prednisone as prescribed.   Take acetaminophen as need.   Follow up with primary care doctor in 1-2 weeks - also have your blood pressure rechecked then, as it is high today.   Return to ER if worse, new symptoms, fevers, spreading redness, or other concern.

## 2020-02-07 ENCOUNTER — Other Ambulatory Visit: Payer: Self-pay | Admitting: Physician Assistant

## 2020-02-07 DIAGNOSIS — I10 Essential (primary) hypertension: Secondary | ICD-10-CM

## 2020-02-17 ENCOUNTER — Encounter: Payer: Self-pay | Admitting: Physician Assistant

## 2020-02-17 ENCOUNTER — Telehealth (INDEPENDENT_AMBULATORY_CARE_PROVIDER_SITE_OTHER): Payer: Managed Care, Other (non HMO) | Admitting: Physician Assistant

## 2020-02-17 ENCOUNTER — Other Ambulatory Visit: Payer: Self-pay

## 2020-02-17 DIAGNOSIS — M1A9XX0 Chronic gout, unspecified, without tophus (tophi): Secondary | ICD-10-CM

## 2020-02-17 DIAGNOSIS — I10 Essential (primary) hypertension: Secondary | ICD-10-CM

## 2020-02-17 MED ORDER — AMLODIPINE BESYLATE 10 MG PO TABS: ORAL_TABLET | ORAL | 1 refills | Status: DC

## 2020-02-17 MED ORDER — ALLOPURINOL 300 MG PO TABS: 300.0000 mg | ORAL_TABLET | Freq: Every day | ORAL | 0 refills | Status: DC

## 2020-02-17 NOTE — Progress Notes (Signed)
I have discussed the procedure for the virtual visit with the patient who has given consent to proceed with assessment and treatment.   Letoya Stallone S Piotr Christopher, CMA     

## 2020-02-17 NOTE — Progress Notes (Signed)
Virtual Visit via Video   I connected with patient on 02/17/20 at  4:00 PM EDT by a video enabled telemedicine application and verified that I am speaking with the correct person using two identifiers.  Location patient: Home Location provider: [facility], Office Persons participating in the virtual visit: Patient, Provider, [name]  I discussed the limitations of evaluation and management by telemedicine and the availability of in person appointments. The patient expressed understanding and agreed to proceed.  Subjective:   HPI:   Patient presents via [name] today for overdue follow-up of hypertension and gout.   Hypertension -- Patient is currently on a regimen of amlodipine 10 mg daily and Lisinopril 40 mg daily. Endorses taking medications as directed and tolerating well. Patient denies chest pain, palpitations, lightheadedness, dizziness, vision changes or frequent headaches. Is overdue for in-office BP check, especially since last checks were at the ER and elevated due to pain from gout. Notes home BP cuff broke and he has not purchased a new one. Is trying to keep a low-salt diet.   Gout --previously on a regimen of allopurinol 300 mg daily.  Was taking as directed until he ran out a few months ago.  Has not requested a refill since.  Did have a gout flareup about a month ago that was treated with prednisone.  Denies any residual symptoms.  Is willing to restart the allopurinol.  ROS:   See pertinent positives and negatives per HPI.  Patient Active Problem List   Diagnosis Date Noted  . Chronic gout involving toe of left foot without tophus 02/15/2018  . Hyperlipidemia 05/05/2016  . Prostate cancer screening 12/21/2015  . Visit for preventive health examination 12/21/2015  . Essential hypertension, benign 03/08/2014  . GERD (gastroesophageal reflux disease) 03/08/2014    Social History   Tobacco Use  . Smoking status: Never Smoker  . Smokeless  tobacco: Never Used  Substance Use Topics  . Alcohol use: Yes    Comment: occasionally    Current Outpatient Medications:  .  amLODipine (NORVASC) 10 MG tablet, TAKE 1 TABLET(10 MG) BY MOUTH DAILY, Disp: 30 tablet, Rfl: 0 .  lisinopril (ZESTRIL) 40 MG tablet, TAKE 1 TABLET(40 MG) BY MOUTH DAILY, Disp: 90 tablet, Rfl: 0 .  tadalafil (CIALIS) 20 MG tablet, 1 Tablet(s) By Mouth As Needed, Disp: 10 tablet, Rfl: 2 .  allopurinol (ZYLOPRIM) 300 MG tablet, Take 1 tablet (300 mg total) by mouth daily. (Patient not taking: Reported on 02/17/2020), Disp: 90 tablet, Rfl: 1  No Known Allergies  Objective:   There were no vitals taken for this visit.  Patient is well-developed, well-nourished in no acute distress.  Resting comfortably at home.  Head is normocephalic, atraumatic.  No labored breathing.  Speech is clear and coherent with logical content.  Patient is alert and oriented at baseline.   Assessment and Plan:   1. Essential hypertension Patient taking medications as directed.  DASH diet reviewed.  Patient to check schedule and call back so he can be scheduled for nurse visit to repeat metabolic panel.  Will check blood pressure manually at that time and make further adjustments.  Discussed it is very important to be compliant with medication and follow-up so we can make sure blood pressure remains under control. - amLODipine (NORVASC) 10 MG tablet; TAKE 1 TABLET(10 MG) BY MOUTH DAILY  Dispense: 90 tablet; Refill: 1 - Comp Met (CMET); Future  2. Chronic gout involving toe of left foot without tophus, unspecified cause  Flare about a month ago.  No residual symptoms.  Restart allopurinol.  Plan to repeat uric acid in 4 weeks. - allopurinol (ZYLOPRIM) 300 MG tablet; Take 1 tablet (300 mg total) by mouth daily.  Dispense: 90 tablet; Refill: 0 - Uric acid; Future     John Rio, PA-C 02/17/2020

## 2020-02-19 ENCOUNTER — Other Ambulatory Visit: Payer: Self-pay | Admitting: Physician Assistant

## 2020-02-19 DIAGNOSIS — I10 Essential (primary) hypertension: Secondary | ICD-10-CM

## 2020-03-27 ENCOUNTER — Other Ambulatory Visit: Payer: Self-pay | Admitting: Physician Assistant

## 2020-03-27 DIAGNOSIS — I10 Essential (primary) hypertension: Secondary | ICD-10-CM

## 2020-03-29 ENCOUNTER — Other Ambulatory Visit: Payer: Self-pay | Admitting: Physician Assistant

## 2020-03-29 DIAGNOSIS — I10 Essential (primary) hypertension: Secondary | ICD-10-CM

## 2020-05-04 ENCOUNTER — Other Ambulatory Visit: Payer: Self-pay | Admitting: Physician Assistant

## 2020-05-09 ENCOUNTER — Other Ambulatory Visit: Payer: Self-pay | Admitting: Physician Assistant

## 2020-05-14 ENCOUNTER — Other Ambulatory Visit: Payer: Self-pay | Admitting: Emergency Medicine

## 2020-05-14 ENCOUNTER — Telehealth: Payer: Self-pay | Admitting: Physician Assistant

## 2020-05-14 DIAGNOSIS — I10 Essential (primary) hypertension: Secondary | ICD-10-CM

## 2020-05-14 MED ORDER — AMLODIPINE BESYLATE 10 MG PO TABS: ORAL_TABLET | ORAL | 0 refills | Status: DC

## 2020-05-14 MED ORDER — LISINOPRIL 40 MG PO TABS: ORAL_TABLET | ORAL | 0 refills | Status: DC

## 2020-05-14 NOTE — Telephone Encounter (Signed)
Rx of Amlodipine and Lisinopril sent to the preferred patient pharmacy

## 2020-05-14 NOTE — Telephone Encounter (Signed)
Pt called in asking for a new script of the lisinopril to be sent into [facility] on [address].   He states that he never got a 90 day supply back in Dec. Just a 30 day supply and now he is out of the medication and the pharmacy told pt to call our office.  Please advise

## 2020-05-18 ENCOUNTER — Other Ambulatory Visit: Payer: Self-pay | Admitting: Physician Assistant

## 2020-05-18 DIAGNOSIS — M1A9XX0 Chronic gout, unspecified, without tophus (tophi): Secondary | ICD-10-CM

## 2020-08-10 ENCOUNTER — Telehealth: Payer: Self-pay | Admitting: Physician Assistant

## 2020-08-10 ENCOUNTER — Other Ambulatory Visit: Payer: Self-pay

## 2020-08-10 DIAGNOSIS — I10 Essential (primary) hypertension: Secondary | ICD-10-CM

## 2020-08-10 MED ORDER — LISINOPRIL 40 MG PO TABS: ORAL_TABLET | ORAL | 0 refills | Status: DC

## 2020-08-10 NOTE — Telephone Encounter (Signed)
..  Medication Refills  Last OV:  Medication:  Lisonopril  Pharmacy:  [facility] on [address], [place] Let patient know to contact pharmacy at the end of the day to make sure medication is ready.   Please notify patient to allow 48-72 hours to process.  Encourage patient to contact the pharmacy for refills or they can request refills through [facility]  Clinical Fills out below:   Last refill:  QTY:  Refill Date:    Other Comments:  Has appt. Scheduled for 10/02/2020 with Bethena Roys for refill?  Please advise.

## 2020-08-10 NOTE — Telephone Encounter (Signed)
Prescription sent to patient's pharmacy.

## 2020-08-17 ENCOUNTER — Other Ambulatory Visit: Payer: Self-pay

## 2020-08-17 DIAGNOSIS — M1A9XX0 Chronic gout, unspecified, without tophus (tophi): Secondary | ICD-10-CM

## 2020-08-17 MED ORDER — ALLOPURINOL 300 MG PO TABS: ORAL_TABLET | ORAL | 0 refills | Status: DC

## 2020-08-20 ENCOUNTER — Other Ambulatory Visit: Payer: Self-pay

## 2020-08-20 DIAGNOSIS — I10 Essential (primary) hypertension: Secondary | ICD-10-CM

## 2020-08-20 MED ORDER — AMLODIPINE BESYLATE 10 MG PO TABS: ORAL_TABLET | ORAL | 0 refills | Status: DC

## 2020-10-02 ENCOUNTER — Encounter: Payer: Managed Care, Other (non HMO) | Admitting: Registered Nurse

## 2020-10-17 ENCOUNTER — Other Ambulatory Visit: Payer: Self-pay

## 2020-10-17 DIAGNOSIS — N529 Male erectile dysfunction, unspecified: Secondary | ICD-10-CM

## 2020-10-17 MED ORDER — TADALAFIL 20 MG PO TABS: ORAL_TABLET | ORAL | 2 refills | Status: DC

## 2020-10-30 ENCOUNTER — Other Ambulatory Visit: Payer: Self-pay | Admitting: Family Medicine

## 2020-10-30 DIAGNOSIS — I10 Essential (primary) hypertension: Secondary | ICD-10-CM

## 2020-11-10 ENCOUNTER — Other Ambulatory Visit: Payer: Self-pay

## 2020-11-10 ENCOUNTER — Emergency Department (HOSPITAL_BASED_OUTPATIENT_CLINIC_OR_DEPARTMENT_OTHER)
Admission: EM | Admit: 2020-11-10 | Discharge: 2020-11-10 | Disposition: A | Discharge status: Home / Self Care | Payer: Managed Care, Other (non HMO) | Attending: Emergency Medicine | Admitting: Emergency Medicine

## 2020-11-10 DIAGNOSIS — I10 Essential (primary) hypertension: Secondary | ICD-10-CM | POA: Diagnosis not present

## 2020-11-10 DIAGNOSIS — Z79899 Other long term (current) drug therapy: Secondary | ICD-10-CM | POA: Insufficient documentation

## 2020-11-10 DIAGNOSIS — R369 Urethral discharge, unspecified: Secondary | ICD-10-CM | POA: Diagnosis present

## 2020-11-10 LAB — URINALYSIS, ROUTINE W REFLEX MICROSCOPIC
Bilirubin Urine: NEGATIVE
Glucose, UA: NEGATIVE mg/dL
Hgb urine dipstick: NEGATIVE
Ketones, ur: NEGATIVE mg/dL
Leukocytes,Ua: NEGATIVE
Nitrite: NEGATIVE
Protein, ur: NEGATIVE mg/dL
Specific Gravity, Urine: 1.02 (ref 1.005–1.030)
pH: 6 (ref 5.0–8.0)

## 2020-11-10 NOTE — ED Notes (Signed)
Pt unable to urinate at this time.  

## 2020-11-10 NOTE — ED Provider Notes (Signed)
[facility] EMERGENCY DEPARTMENT Provider Note   CSN: [id number] Arrival date & time: 11/10/20  1324     History Chief Complaint  Patient presents with   Penile Discharge    John Simmons is a 45 y.o. male.  Patient to ED after seeing a white discharge from his penis this morning. No dysuria, abdominal pain, fever, testicular pain.   The history is provided by the patient. No language interpreter was used.  Penile Discharge Pertinent negatives include no abdominal pain.      Past Medical History:  Diagnosis Date   Blood transfusion without reported diagnosis    for anemia late 20's or early 30's   Chronic neck pain    Colon polyp    GERD (gastroesophageal reflux disease)    Gout    Hyperlipidemia    Hypertension     Patient Active Problem List   Diagnosis Date Noted   Chronic gout involving toe of left foot without tophus 02/15/2018   Hyperlipidemia 05/05/2016   Prostate cancer screening 12/21/2015   Visit for preventive health examination 12/21/2015   Essential hypertension, benign 03/08/2014   GERD (gastroesophageal reflux disease) 03/08/2014    Past Surgical History:  Procedure Laterality Date   ANKLE FRACTURE SURGERY Right    COLONOSCOPY     around 2014 or 2015   ESOPHAGOGASTRODUODENOSCOPY     more than 10 years ago   HEMORRHOID BANDING  2016   negative         Family History  Problem Relation Age of Onset   Hypertension Mother    Diabetes Mother    Hypertension Father    Colonic polyp Paternal Grandfather    Esophageal cancer Neg Hx    Colon cancer Neg Hx     Social History   Tobacco Use   Smoking status: Never   Smokeless tobacco: Never  Vaping Use   Vaping Use: Never used  Substance Use Topics   Alcohol use: Yes    Comment: occasionally   Drug use: No    Home Medications Prior to Admission medications   Medication Sig Start Date End Date Taking? Authorizing Provider  allopurinol (ZYLOPRIM) 300 MG tablet TAKE 1  TABLET(300 MG) BY MOUTH DAILY 08/17/20   Sheliah Hatch, MD  amLODipine (NORVASC) 10 MG tablet TAKE 1 TABLET(10 MG) BY MOUTH DAILY 08/20/20   Worthy Rancher B, FNP  lisinopril (ZESTRIL) 40 MG tablet TAKE 1 TABLET(40 MG) BY MOUTH DAILY 10/30/20   Janeece Agee, NP  tadalafil (CIALIS) 20 MG tablet TAKE 1 TABLET BY MOUTH AS NEEDED 10/17/20   Eulis Foster, FNP    Allergies    Patient has no known allergies.  Review of Systems   Review of Systems  Constitutional:  Negative for chills and fever.  Gastrointestinal: Negative.  Negative for abdominal pain.  Genitourinary:  Positive for penile discharge. Negative for dysuria, scrotal swelling and testicular pain.  Musculoskeletal: Negative.   Skin: Negative.  Negative for color change and wound.   Physical Exam Updated Vital Signs BP (!) 164/113 (BP Location: Left Arm)   Pulse 85   Temp 98.1 F (36.7 C) (Oral)   Resp 18   Ht 5\' 9"  (1.753 m)   Wt 99.3 kg   SpO2 98%   BMI 32.34 kg/m   Physical Exam Vitals and nursing note reviewed.  Constitutional:      Comments: Patient refused a physical exam.    ED Results / Procedures / Treatments   Labs (  all labs ordered are listed, but only abnormal results are displayed) Labs Reviewed  URINALYSIS, ROUTINE W REFLEX MICROSCOPIC  GC/CHLAMYDIA PROBE AMP ([place]) NOT AT [facility]    EKG None  Radiology No results found.  Procedures Procedures   Medications Ordered in ED Medications - No data to display  ED Course  I have reviewed the triage vital signs and the nursing notes.  Pertinent labs & imaging results that were available during my care of the patient were reviewed by me and considered in my medical decision making (see chart for details).    MDM Rules/Calculators/A&P                           Patient to ED for testing of penile discharge seen earlier this morning. No other symptoms. The patient did not want to be examined.  Urine negative for abnormality. The  patient is informed GC/chlamydia results will be available on MyChart.   Final Clinical Impression(s) / ED Diagnoses Final diagnoses:  None   Penile discharge  Rx / DC Orders ED Discharge Orders     None        Danne Harbor 11/10/20 1445    Koleen Distance, MD 11/10/20 [phone]

## 2020-11-10 NOTE — Discharge Instructions (Addendum)
Your urine does not show any infection. The culture of the urine to test for gonorrhea or chlamydia will result in about 48 hours and will be available on MyChart.   Follow up with your doctor as needed.

## 2020-11-10 NOTE — ED Triage Notes (Signed)
Pt noted white discharge on the head of his penis this morning. Denies noticing any other discharge or urinary symptoms.

## 2020-11-12 LAB — GC/CHLAMYDIA PROBE AMP (~~LOC~~) NOT AT ARMC
Chlamydia: NEGATIVE
Comment: NEGATIVE
Comment: NORMAL
Neisseria Gonorrhea: NEGATIVE

## 2020-11-15 ENCOUNTER — Other Ambulatory Visit: Payer: Self-pay

## 2020-11-15 DIAGNOSIS — I10 Essential (primary) hypertension: Secondary | ICD-10-CM

## 2020-11-15 DIAGNOSIS — M1A9XX0 Chronic gout, unspecified, without tophus (tophi): Secondary | ICD-10-CM

## 2020-11-15 MED ORDER — ALLOPURINOL 300 MG PO TABS: ORAL_TABLET | ORAL | 0 refills | Status: DC

## 2020-11-15 MED ORDER — AMLODIPINE BESYLATE 10 MG PO TABS: ORAL_TABLET | ORAL | 0 refills | Status: DC

## 2020-11-28 ENCOUNTER — Other Ambulatory Visit: Payer: Self-pay | Admitting: Family

## 2020-11-28 ENCOUNTER — Other Ambulatory Visit: Payer: Self-pay

## 2020-11-28 DIAGNOSIS — N529 Male erectile dysfunction, unspecified: Secondary | ICD-10-CM

## 2020-11-28 MED ORDER — TADALAFIL 20 MG PO TABS: ORAL_TABLET | ORAL | 0 refills | Status: DC

## 2020-12-03 ENCOUNTER — Other Ambulatory Visit: Payer: Self-pay

## 2020-12-03 ENCOUNTER — Telehealth: Payer: Self-pay

## 2020-12-03 MED ORDER — SILDENAFIL CITRATE 50 MG PO TABS: 50.0000 mg | ORAL_TABLET | ORAL | 0 refills | Status: DC | PRN

## 2020-12-03 NOTE — Telephone Encounter (Signed)
PT called and is unable to get the tadalafil (CIALIS) 20 MG tablet till September due to insurance by wants to have sildenafil sent to   I did not see sildenafil on file to be filled   [id number] - [place], [place] - [address] AT [facility] [address]    Pt call back [phone] TOC is 12/25/2020 with Richard Last seen 10/21 with Marcelline Mates

## 2020-12-03 NOTE — Telephone Encounter (Signed)
Can send sildenafil 50 mg  Sig: take 25-50mg  po qd prn for ED Disp 30 no refills  Thanks  Rich

## 2020-12-03 NOTE — Telephone Encounter (Signed)
Medication sent to pharmacy  

## 2020-12-03 NOTE — Telephone Encounter (Signed)
Please advise if Sildenafil can be sent in. If not, I will let patient know he will have to pay OOP for Cialis until insurance will cover

## 2020-12-25 ENCOUNTER — Encounter: Payer: Managed Care, Other (non HMO) | Admitting: Registered Nurse

## 2021-01-02 ENCOUNTER — Other Ambulatory Visit: Payer: Self-pay | Admitting: Registered Nurse

## 2021-01-02 DIAGNOSIS — I10 Essential (primary) hypertension: Secondary | ICD-10-CM

## 2021-01-02 NOTE — Telephone Encounter (Signed)
Pt needs refill on lisinopril (ZESTRIL) 40 MG tablet   [id number] - [place], [place] - [address]   Pt has TOC in 11/22   Pt call back [phone]

## 2021-01-15 ENCOUNTER — Telehealth: Payer: Self-pay | Admitting: Family

## 2021-01-15 DIAGNOSIS — N529 Male erectile dysfunction, unspecified: Secondary | ICD-10-CM

## 2021-01-16 NOTE — Telephone Encounter (Signed)
Patient called back asking about this refill - please advise

## 2021-01-24 NOTE — Telephone Encounter (Signed)
Patient called back an is still needing his generic Cialis filled - please send to [facility] on [address] in [place] - Patient has upcoming appointment with Gerlene Burdock in November for transfer of care.  Please advise.

## 2021-01-25 ENCOUNTER — Other Ambulatory Visit: Payer: Self-pay | Admitting: Family

## 2021-01-25 DIAGNOSIS — N529 Male erectile dysfunction, unspecified: Secondary | ICD-10-CM

## 2021-01-28 NOTE — Telephone Encounter (Signed)
Can someone follow up with this pt needing his refill on his medication?

## 2021-01-29 NOTE — Telephone Encounter (Signed)
Cannot refill. Has never had visit with me, no in person visit since Feb 2021.  Will reconsider at upcoming appt   Thank you  Rich

## 2021-01-29 NOTE — Telephone Encounter (Signed)
Called and left message with patient that we can not fill medication

## 2021-02-13 ENCOUNTER — Other Ambulatory Visit: Payer: Self-pay | Admitting: Family

## 2021-02-13 ENCOUNTER — Other Ambulatory Visit: Payer: Self-pay

## 2021-02-13 DIAGNOSIS — M1A9XX0 Chronic gout, unspecified, without tophus (tophi): Secondary | ICD-10-CM

## 2021-02-20 ENCOUNTER — Other Ambulatory Visit: Payer: Self-pay | Admitting: Family

## 2021-02-20 DIAGNOSIS — M1A9XX0 Chronic gout, unspecified, without tophus (tophi): Secondary | ICD-10-CM

## 2021-02-28 ENCOUNTER — Other Ambulatory Visit: Payer: Self-pay | Admitting: Family

## 2021-02-28 DIAGNOSIS — M1A9XX0 Chronic gout, unspecified, without tophus (tophi): Secondary | ICD-10-CM

## 2021-03-08 ENCOUNTER — Encounter: Payer: Managed Care, Other (non HMO) | Admitting: Registered Nurse

## 2021-03-16 ENCOUNTER — Other Ambulatory Visit: Payer: Self-pay | Admitting: Registered Nurse

## 2021-03-16 DIAGNOSIS — I10 Essential (primary) hypertension: Secondary | ICD-10-CM

## 2021-05-02 ENCOUNTER — Encounter: Payer: Managed Care, Other (non HMO) | Admitting: Registered Nurse

## 2021-05-14 ENCOUNTER — Other Ambulatory Visit: Payer: Self-pay | Admitting: Family

## 2021-05-14 DIAGNOSIS — I10 Essential (primary) hypertension: Secondary | ICD-10-CM

## 2021-05-18 ENCOUNTER — Other Ambulatory Visit: Payer: Self-pay | Admitting: Family

## 2021-05-18 DIAGNOSIS — I10 Essential (primary) hypertension: Secondary | ICD-10-CM

## 2021-06-24 ENCOUNTER — Telehealth: Payer: Self-pay

## 2021-06-24 NOTE — Telephone Encounter (Signed)
Pt states that he will call back to schedule, he states that he can't get off work to come in for an appt. Right now.  ?

## 2021-06-24 NOTE — Telephone Encounter (Signed)
Pt is a John Simmons pt will need to establish with a provider and be seen as last visit was 01/2020 with Pepin.  ?

## 2021-06-24 NOTE — Telephone Encounter (Signed)
Pt last appt was with Selena Batten on 02/17/20, pt no showed for his TOC with Rich on 05/02/21 ? ? ?Chief Complaint Prescription Refill or Medication Request (non ?symptomatic) ?Reason for Call Medication Question / Request ?Initial Comment caller states he missed the call from the triage ?nurse kimberly and is calling back. He is calling ?back about prescription refill. ?Translation No ?Nurse Assessment ?Nurse: Breeding, RN, [place] Date/Time (Eastern Time): 06/21/2021 6:25:04 PM ?Confirm and document reason for call. If ?symptomatic, describe symptoms. ?---Caller is looking for a Rx refill for lisinopril 40mg  ?daily. Pt states he has been on this medication for ?years. Caller notes feeling lightheaded when he eats. ?Caller notes BP 185/130 (yesterday). Caller worries ?about high BP right now but cannot take his BP. Caller ?denies any other s/s at this time. ?Does the patient have any new or worsening ?symptoms? ---Yes ?Will a triage be completed? ---Yes ?Related visit to physician within the last 2 weeks? ---No ?Does the PT have any chronic conditions? (i.e. ?diabetes, asthma, this includes High risk factors for ?pregnancy, etc.) ?---Yes ?List chronic conditions. ---htn ?Is this a behavioral health or substance abuse call? ---No ?Nurse: Breeding, RN, Date/Time (Eastern Time): 06/21/2021 6:33:34 PM ?Please select the assessment type ---Refill ?Additional Documentation ?---Caller looking for refill on lisinopril. He notes ?he was able to pick up an emergency dose for the ?weekend but will need a refill called in on monday. ?PLEASE NOTE: All timestamps contained within this report are represented as Sunday Standard Time. ?CONFIDENTIALTY NOTICE: This fax transmission is intended only for the addressee. It contains information that is legally privileged, confidential or ?otherwise protected from use or disclosure. If you are not the intended recipient, you are strictly prohibited from reviewing, disclosing, copying  using ?or disseminating any of this information or taking any action in reliance on or regarding this information. If you have received this fax in error, please ?notify [place] immediately by telephone so that we can arrange for its return to [place]. Phone: [phone], Toll-Free: [phone], Fax: [phone] ?Page: 2 of 2 ?Call Id: [phone] ?Nurse Assessment ?Does the patient have enough medication to last until ?the office opens? ---Yes ?Guidelines ?Guideline Title Affirmed Question Affirmed Notes Nurse Date/Time (Eastern ?Time) ?Dizziness - ?Lightheadedness ?Taking a medicine ?that could cause ?dizziness (e.g., blood ?pressure medications, ?diuretics) ?Breeding, [id number], ?[place] ?06/21/2021 6:29:13 PM ?Disp. Time (Eastern ?Time) Disposition Final User ?06/21/2021 6:33:23 PM See PCP within 24 Hours Yes Breeding, RN, 08/21/2021 ?Caller Disagree/Comply Disagree ?Caller Understands Yes ?PreDisposition Did not know what to do ?Care Advice Given Per Guideline ?SEE PCP WITHIN 24 HOURS: * IF OFFICE WILL BE CLOSED: You need to be seen within the next 24 hours. A clinic or an ?urgent care center is often a good source of care if your doctor's office is closed or you can't get an appointment. LIE DOWN AND ?REST: DRINK FLUIDS: CALL BACK IF: * Passes out (faints) * You become worse CARE ADVICE given per Dizziness (Adult) ?guideline. ?Comments ?User: [place] RN Date/Time (Eastern Time): 06/21/2021 6:36:47 PM ?Caller notes a the end of call that he has just picked up his emergency weekend pills for his BP meds and is going ?to wait and see how he feels once he has taken his medication. Advised pt that I would send this message to the ?office for a refill need to be sent in to [facility] on Monday. Advised caller to call office then to make sure that it ?was received. Caller verbalized understanding. ?Referrals ?GO TO FACILITY  REFUSED ? ? ?Chief Complaint Prescription Refill or Medication Request (non ?symptomatic) ?Reason for  Call Medication Question / Request ?Initial Comment Caller stated that he has not had an appetite and ?has been feeling dizzy when he tries to eat. Caller ?states he has been out of med for a few days ?Translation No ?Disp. Time (Eastern ?Time) Disposition Final User ?06/21/2021 5:38:12 PM Attempt made - message left Breeding, RN, [place] ?06/21/2021 6:08:12 PM FINAL ATTEMPT MADE - ?message left ?Yes Breeding, RN, [place] ? ? ?

## 2021-06-28 ENCOUNTER — Encounter (HOSPITAL_BASED_OUTPATIENT_CLINIC_OR_DEPARTMENT_OTHER): Payer: Self-pay

## 2021-06-28 ENCOUNTER — Other Ambulatory Visit: Payer: Self-pay

## 2021-06-28 ENCOUNTER — Emergency Department (HOSPITAL_BASED_OUTPATIENT_CLINIC_OR_DEPARTMENT_OTHER)
Admission: EM | Admit: 2021-06-28 | Discharge: 2021-06-28 | Disposition: A | Discharge status: Home / Self Care | Payer: 59 | Attending: Emergency Medicine | Admitting: Emergency Medicine

## 2021-06-28 DIAGNOSIS — Z760 Encounter for issue of repeat prescription: Secondary | ICD-10-CM | POA: Insufficient documentation

## 2021-06-28 DIAGNOSIS — Z79899 Other long term (current) drug therapy: Secondary | ICD-10-CM | POA: Insufficient documentation

## 2021-06-28 DIAGNOSIS — I10 Essential (primary) hypertension: Secondary | ICD-10-CM | POA: Insufficient documentation

## 2021-06-28 MED ORDER — AMLODIPINE BESYLATE 10 MG PO TABS: 10.0000 mg | ORAL_TABLET | Freq: Every day | ORAL | 1 refills | Status: DC

## 2021-06-28 MED ORDER — LISINOPRIL 40 MG PO TABS: 40.0000 mg | ORAL_TABLET | Freq: Every day | ORAL | 1 refills | Status: DC

## 2021-06-28 NOTE — Discharge Instructions (Signed)
Take medicine as prescribed.

## 2021-06-28 NOTE — ED Provider Notes (Signed)
?[facility] EMERGENCY DEPARTMENT ?Provider Note ? ? ?CSN: [id number] ?Arrival date & time: 06/28/21  1725 ? ?  ? ?History ? ?Chief Complaint  ?Patient presents with  ? Medication Refill  ? ? ?John Simmons is a 46 y.o. male. ? ? ?Medication Refill ? ?Patient with history of hypertension presents due to medication refill request.  Has been out of blood pressure medicine for 2 days, usually takes lisinopril and amlodipine.  He does have an appointment with a new PCP in 3 months because his previous PCP left the practice.  He is unable to have his medicine refilled prior to then.  He denies any symptoms at this time. ? ?Home Medications ?Prior to Admission medications   ?Medication Sig Start Date End Date Taking? Authorizing Provider  ?amLODipine (NORVASC) 10 MG tablet Take 1 tablet (10 mg total) by mouth daily. 06/28/21  Yes Theron Arista, PA-C  ?lisinopril (ZESTRIL) 40 MG tablet Take 1 tablet (40 mg total) by mouth daily. 06/28/21  Yes Theron Arista, PA-C  ?allopurinol (ZYLOPRIM) 300 MG tablet TAKE 1 TABLET(300 MG) BY MOUTH DAILY 03/01/21   Eulis Foster, FNP  ?sildenafil (VIAGRA) 50 MG tablet Take 1 tablet (50 mg total) by mouth as needed for erectile dysfunction. Take 1-2 tablets by mouth as needed for erectile dysfunction 12/03/20   Janeece Agee, NP  ?tadalafil (CIALIS) 20 MG tablet TAKE 1 TABLET BY MOUTH AS NEEDED 11/28/20   Eulis Foster, FNP  ?   ? ?Allergies    ?Patient has no known allergies.   ? ?Review of Systems   ?Review of Systems ? ?Physical Exam ?Updated Vital Signs ?BP (!) 143/105 (BP Location: Left Arm)   Pulse 64   Temp 97.9 ?F (36.6 ?C) (Oral)   Resp 16   Ht 5\' 9"  (1.753 m)   Wt 96.6 kg   SpO2 100%   BMI 31.45 kg/m?  ?Physical Exam ?Vitals and nursing note reviewed. Exam conducted with a chaperone present.  ?Constitutional:   ?   General: He is not in acute distress. ?   Appearance: Normal appearance.  ?HENT:  ?   Head: Normocephalic and atraumatic.  ?Eyes:  ?   General: No  scleral icterus. ?   Extraocular Movements: Extraocular movements intact.  ?   Pupils: Pupils are equal, round, and reactive to light.  ?Skin: ?   Coloration: Skin is not jaundiced.  ?Neurological:  ?   Mental Status: He is alert. Mental status is at baseline.  ?   Coordination: Coordination normal.  ? ? ?ED Results / Procedures / Treatments   ?Labs ?(all labs ordered are listed, but only abnormal results are displayed) ?Labs Reviewed - No data to display ? ?EKG ?None ? ?Radiology ?No results found. ? ?Procedures ?Procedures  ? ? ?Medications Ordered in ED ?Medications - No data to display ? ?ED Course/ Medical Decision Making/ A&P ?  ?                        ?Medical Decision Making ?Risk ?Prescription drug management. ? ? ?Patient is a well-appearing 46 year old with history hypertension presenting for medication refill.  Blood pressure slightly elevated at 143/105, he is asymptomatic.  Will provide refill of medicine until he is able to be seen by his primary and get refills from there. ? ? ? ? ? ? ? ?Final Clinical Impression(s) / ED Diagnoses ?Final diagnoses:  ?Medication refill  ? ? ?Rx / DC Orders ?  ED Discharge Orders   ? ?      Ordered  ?  amLODipine (NORVASC) 10 MG tablet  Daily       ? 06/28/21 1743  ?  lisinopril (ZESTRIL) 40 MG tablet  Daily       ? 06/28/21 1743  ? ?  ?  ? ?  ? ? ?  ?Theron Arista, PA-C ?06/28/21 1746 ? ?  ?Arby Barrette, MD ?06/28/21 2150 ? ?

## 2021-06-28 NOTE — ED Triage Notes (Addendum)
Pt requesting BP med refill-states he has been out x 4 days-PCP does not have appt until June-states he felt dizzy earlier and feels is r/t to not eating/working out-NAD-steady gait ?

## 2021-07-14 ENCOUNTER — Other Ambulatory Visit: Payer: Self-pay | Admitting: Family

## 2021-07-14 DIAGNOSIS — M1A9XX0 Chronic gout, unspecified, without tophus (tophi): Secondary | ICD-10-CM

## 2021-07-16 ENCOUNTER — Other Ambulatory Visit: Payer: Self-pay | Admitting: Family

## 2021-07-16 DIAGNOSIS — M1A9XX0 Chronic gout, unspecified, without tophus (tophi): Secondary | ICD-10-CM

## 2021-11-05 ENCOUNTER — Emergency Department (HOSPITAL_BASED_OUTPATIENT_CLINIC_OR_DEPARTMENT_OTHER)
Admission: EM | Admit: 2021-11-05 | Discharge: 2021-11-05 | Disposition: A | Discharge status: Home / Self Care | Payer: 59 | Attending: Emergency Medicine | Admitting: Emergency Medicine

## 2021-11-05 ENCOUNTER — Other Ambulatory Visit: Payer: Self-pay

## 2021-11-05 ENCOUNTER — Encounter (HOSPITAL_BASED_OUTPATIENT_CLINIC_OR_DEPARTMENT_OTHER): Payer: Self-pay | Admitting: Pediatrics

## 2021-11-05 DIAGNOSIS — Z79899 Other long term (current) drug therapy: Secondary | ICD-10-CM | POA: Insufficient documentation

## 2021-11-05 DIAGNOSIS — I10 Essential (primary) hypertension: Secondary | ICD-10-CM | POA: Diagnosis not present

## 2021-11-05 DIAGNOSIS — Z760 Encounter for issue of repeat prescription: Secondary | ICD-10-CM | POA: Insufficient documentation

## 2021-11-05 MED ORDER — AMLODIPINE BESYLATE 10 MG PO TABS: 10.0000 mg | ORAL_TABLET | Freq: Every day | ORAL | 0 refills | Status: DC

## 2021-11-05 MED ORDER — LISINOPRIL 40 MG PO TABS: 40.0000 mg | ORAL_TABLET | Freq: Every day | ORAL | 0 refills | Status: DC

## 2021-11-05 NOTE — Discharge Instructions (Signed)
Please follow-up with your primary care doctor for further management of your high blood pressure.  Get help right away if you: Develop a severe headache or confusion. Have unusual weakness or numbness. Feel faint. Have severe pain in your chest or abdomen. Vomit repeatedly. Have trouble breathing.

## 2021-11-05 NOTE — ED Provider Notes (Signed)
[facility] EMERGENCY DEPARTMENT Provider Note   CSN: [id number] Arrival date & time: 11/05/21  1300     History  Chief Complaint  Patient presents with   Hypertension   Gastroesophageal Reflux   Medication Refill    John Simmons is a 46 y.o. male with a history of hypertension, GERD, hyperlipidemia, gout.  Presents emergency department with a chief complaint of medication refill.  Patient states that he ran out of his lisinopril and amlodipine 2 days prior.  Patient states that he is been unable to see his primary care doctor.  Patient is requesting a refill on his medication.  Patient denies any other complaints.    Patient denies any headache, chest pain, shortness of breath, visual disturbance, numbness, weakness, facial asymmetry, dysarthria.   Hypertension Pertinent negatives include no chest pain, no abdominal pain, no headaches and no shortness of breath.  Gastroesophageal Reflux Pertinent negatives include no chest pain, no abdominal pain, no headaches and no shortness of breath.  Medication Refill      Home Medications Prior to Admission medications   Medication Sig Start Date End Date Taking? Authorizing Provider  allopurinol (ZYLOPRIM) 300 MG tablet TAKE 1 TABLET(300 MG) BY MOUTH DAILY 03/01/21   Worthy Rancher B, FNP  amLODipine (NORVASC) 10 MG tablet Take 1 tablet (10 mg total) by mouth daily. 06/28/21   Theron Arista, PA-C  lisinopril (ZESTRIL) 40 MG tablet Take 1 tablet (40 mg total) by mouth daily. 06/28/21   Theron Arista, PA-C  sildenafil (VIAGRA) 50 MG tablet Take 1 tablet (50 mg total) by mouth as needed for erectile dysfunction. Take 1-2 tablets by mouth as needed for erectile dysfunction 12/03/20   Janeece Agee, NP  tadalafil (CIALIS) 20 MG tablet TAKE 1 TABLET BY MOUTH AS NEEDED 11/28/20   Eulis Foster, FNP      Allergies    Patient has no known allergies.    Review of Systems   Review of Systems  Constitutional:  Negative for chills  and fever.  Eyes:  Negative for visual disturbance.  Respiratory:  Negative for shortness of breath.   Cardiovascular:  Negative for chest pain.  Gastrointestinal:  Negative for abdominal pain, nausea and vomiting.  Genitourinary:  Negative for difficulty urinating and dysuria.  Musculoskeletal:  Negative for back pain and neck pain.  Skin:  Negative for color change and rash.  Neurological:  Negative for dizziness, syncope, light-headedness and headaches.  Psychiatric/Behavioral:  Negative for confusion.     Physical Exam Updated Vital Signs BP (!) 137/99 (BP Location: Left Arm)   Pulse 92   Temp 98.9 F (37.2 C) (Oral)   Resp 20   Ht 5\' 9"  (1.753 m)   Wt 93.4 kg   SpO2 98%   BMI 30.42 kg/m  Physical Exam Vitals and nursing note reviewed.  Constitutional:      General: He is not in acute distress.    Appearance: He is not ill-appearing, toxic-appearing or diaphoretic.  HENT:     Head: Normocephalic.  Eyes:     General: No scleral icterus.       Right eye: No discharge.        Left eye: No discharge.  Cardiovascular:     Rate and Rhythm: Normal rate.     Pulses:          Radial pulses are 2+ on the right side and 2+ on the left side.  Pulmonary:     Effort: Pulmonary effort is normal.  No tachypnea, bradypnea or respiratory distress.     Breath sounds: Normal breath sounds. No stridor.  Musculoskeletal:     Right lower leg: No edema.     Left lower leg: No edema.  Skin:    General: Skin is warm and dry.  Neurological:     General: No focal deficit present.     Mental Status: He is alert and oriented to person, place, and time.     GCS: GCS eye subscore is 4. GCS verbal subscore is 5. GCS motor subscore is 6.  Psychiatric:        Behavior: Behavior is cooperative.     ED Results / Procedures / Treatments   Labs (all labs ordered are listed, but only abnormal results are displayed) Labs Reviewed - No data to display  EKG None  Radiology No results  found.  Procedures Procedures    Medications Ordered in ED Medications - No data to display  ED Course/ Medical Decision Making/ A&P                           Medical Decision Making  Alert 46 year old male in no acute distress, nontoxic-appearing.  Presents emerged department chief complaint of medication refill.  Information obtained from patient.  I reviewed patient's past medical records including previous provider notes, labs, and imaging.  Patient has medical history as outlined in HPI which complicates his care.  Per chart review patient was seen in March 2023 for medication refill.  Patient reports that he was supposed to follow-up with his PCP in 3 months from that date however was unable to.    Patient denies any symptoms.  We will give patient 1 month refill of his medication, importance of PCP follow-up was stressed to the patient.  Based on patient's chief complaint, I considered admission might be necessary, however after reassuring ED workup feel patient is reasonable for discharge.  Discussed results, findings, treatment and follow up. Patient advised of return precautions. Patient verbalized understanding and agreed with plan.  Portions of this note were generated with Scientist, clinical (histocompatibility and immunogenetics). Dictation errors may occur despite best attempts at proofreading.         Final Clinical Impression(s) / ED Diagnoses Final diagnoses:  None    Rx / DC Orders ED Discharge Orders     None         Haskel Schroeder, PA-C 11/05/21 1408    Sloan Leiter, DO 11/06/21 [phone]

## 2021-11-05 NOTE — ED Triage Notes (Signed)
C/O acid reflux and high blood pressure and out of medications

## 2021-11-05 NOTE — ED Notes (Signed)
Dc instructions and scripts reviewed with pt no questions or concerns at this time. Will follow up with pcp.  

## 2021-12-04 ENCOUNTER — Emergency Department (HOSPITAL_BASED_OUTPATIENT_CLINIC_OR_DEPARTMENT_OTHER)
Admission: EM | Admit: 2021-12-04 | Discharge: 2021-12-04 | Disposition: A | Discharge status: Home / Self Care | Payer: 59 | Attending: Emergency Medicine | Admitting: Emergency Medicine

## 2021-12-04 ENCOUNTER — Other Ambulatory Visit: Payer: Self-pay

## 2021-12-04 ENCOUNTER — Encounter (HOSPITAL_BASED_OUTPATIENT_CLINIC_OR_DEPARTMENT_OTHER): Payer: Self-pay | Admitting: Emergency Medicine

## 2021-12-04 DIAGNOSIS — U071 COVID-19: Secondary | ICD-10-CM | POA: Insufficient documentation

## 2021-12-04 LAB — SARS CORONAVIRUS 2 BY RT PCR: SARS Coronavirus 2 by RT PCR: POSITIVE — AB

## 2021-12-04 LAB — GROUP A STREP BY PCR: Group A Strep by PCR: NOT DETECTED

## 2021-12-04 MED ORDER — LISINOPRIL 40 MG PO TABS: 40.0000 mg | ORAL_TABLET | Freq: Every day | ORAL | 0 refills | Status: DC

## 2021-12-04 MED ORDER — ONDANSETRON 4 MG PO TBDP: ORAL_TABLET | ORAL | 0 refills | Status: AC

## 2021-12-04 MED ORDER — BENZONATATE 100 MG PO CAPS: 100.0000 mg | ORAL_CAPSULE | Freq: Three times a day (TID) | ORAL | 0 refills | Status: AC

## 2021-12-04 NOTE — ED Provider Notes (Addendum)
[facility] EMERGENCY DEPARTMENT Provider Note   CSN: [id number] Arrival date & time: 12/04/21  1505     History  Chief Complaint  Patient presents with   Sore Throat    John Simmons is a 46 y.o. male.  46 yo M with a cc of cough congestion.  This been going on for about 4 days now.  No known sick contacts.  He has had COVID before and thought maybe had caught a cold.  Decided come here to be evaluated.   Sore Throat       Home Medications Prior to Admission medications   Medication Sig Start Date End Date Taking? Authorizing Provider  benzonatate (TESSALON) 100 MG capsule Take 1 capsule (100 mg total) by mouth every 8 (eight) hours. 12/04/21  Yes Melene Plan, DO  ondansetron (ZOFRAN-ODT) 4 MG disintegrating tablet 4mg  ODT q4 hours prn nausea/vomit 12/04/21  Yes 12/06/21, Cassundra Mckeever, DO  allopurinol (ZYLOPRIM) 300 MG tablet TAKE 1 TABLET(300 MG) BY MOUTH DAILY 03/01/21   13/11/22 B, FNP  amLODipine (NORVASC) 10 MG tablet Take 1 tablet (10 mg total) by mouth daily. 11/05/21 12/05/21  12/07/21, PA-C  lisinopril (ZESTRIL) 40 MG tablet Take 1 tablet (40 mg total) by mouth daily. 12/04/21 01/03/22  01/05/22, DO  sildenafil (VIAGRA) 50 MG tablet Take 1 tablet (50 mg total) by mouth as needed for erectile dysfunction. Take 1-2 tablets by mouth as needed for erectile dysfunction 12/03/20   12/05/20, NP  tadalafil (CIALIS) 20 MG tablet TAKE 1 TABLET BY MOUTH AS NEEDED 11/28/20   01/28/21, FNP      Allergies    Patient has no known allergies.    Review of Systems   Review of Systems  Physical Exam Updated Vital Signs BP (!) 126/95 (BP Location: Right Arm)   Pulse 78   Temp 99 F (37.2 C) (Oral)   Resp 18   Ht 5\' 9"  (1.753 m)   Wt 93 kg   SpO2 99%   BMI 30.27 kg/m  Physical Exam Vitals and nursing note reviewed.  Constitutional:      Appearance: He is well-developed.  HENT:     Head: Normocephalic and atraumatic.  Eyes:     Pupils:  Pupils are equal, round, and reactive to light.  Neck:     Vascular: No JVD.  Cardiovascular:     Rate and Rhythm: Normal rate and regular rhythm.     Heart sounds: No murmur heard.    No friction rub. No gallop.  Pulmonary:     Effort: No respiratory distress.     Breath sounds: No wheezing.  Abdominal:     General: There is no distension.     Tenderness: There is no abdominal tenderness. There is no guarding or rebound.  Musculoskeletal:        General: Normal range of motion.     Cervical back: Normal range of motion and neck supple.  Skin:    Coloration: Skin is not pale.     Findings: No rash.  Neurological:     Mental Status: He is alert and oriented to person, place, and time.  Psychiatric:        Behavior: Behavior normal.     ED Results / Procedures / Treatments   Labs (all labs ordered are listed, but only abnormal results are displayed) Labs Reviewed  SARS CORONAVIRUS 2 BY RT PCR - Abnormal; Notable for the following components:  Result Value   SARS Coronavirus 2 by RT PCR POSITIVE (*)    All other components within normal limits  GROUP A STREP BY PCR    EKG None  Radiology No results found.  Procedures Procedures    Medications Ordered in ED Medications - No data to display  ED Course/ Medical Decision Making/ A&P                           Medical Decision Making Risk Prescription drug management.   46 yo M with a chief complaints of cough congestion.  Patient had a COVID test here that was positive.  Symptoms are consistent with the same.  We will treat supportively.  PCP follow-up.  Patient also wanting his lisinopril refilled.  We will give him a 30-day supply.  4:45 PM:  I have discussed the diagnosis/risks/treatment options with the patient.  Evaluation and diagnostic testing in the emergency department does not suggest an emergent condition requiring admission or immediate intervention beyond what has been performed at this time.   They will follow up with PCP. We also discussed returning to the ED immediately if new or worsening sx occur. We discussed the sx which are most concerning (e.g., sudden worsening pain, fever, inability to tolerate by mouth) that necessitate immediate return. Medications administered to the patient during their visit and any new prescriptions provided to the patient are listed below.  Medications given during this visit Medications - No data to display   The patient appears reasonably screen and/or stabilized for discharge and I doubt any other medical condition or other [facility] requiring further screening, evaluation, or treatment in the ED at this time prior to discharge.          Final Clinical Impression(s) / ED Diagnoses Final diagnoses:  COVID-19 virus infection    Rx / DC Orders ED Discharge Orders          Ordered    benzonatate (TESSALON) 100 MG capsule  Every 8 hours        12/04/21 1643    ondansetron (ZOFRAN-ODT) 4 MG disintegrating tablet        12/04/21 1643    lisinopril (ZESTRIL) 40 MG tablet  Daily        12/04/21 1643              Melene Plan, DO 12/04/21 1645    Melene Plan, DO 12/04/21 1646

## 2021-12-04 NOTE — Discharge Instructions (Signed)
Take tylenol 2 pills 4 times a day and motrin 4 pills 3 times a day.  Drink plenty of fluids.  Return for worsening shortness of breath, headache, confusion. Follow up with your family doctor.   

## 2021-12-04 NOTE — ED Triage Notes (Signed)
Pt reports sore throat, chills, runny nose, HA for several days

## 2021-12-07 ENCOUNTER — Encounter (HOSPITAL_BASED_OUTPATIENT_CLINIC_OR_DEPARTMENT_OTHER): Payer: Self-pay | Admitting: Pediatrics

## 2021-12-07 ENCOUNTER — Emergency Department (HOSPITAL_BASED_OUTPATIENT_CLINIC_OR_DEPARTMENT_OTHER)
Admission: EM | Admit: 2021-12-07 | Discharge: 2021-12-07 | Disposition: A | Discharge status: Home / Self Care | Payer: 59 | Attending: Emergency Medicine | Admitting: Emergency Medicine

## 2021-12-07 ENCOUNTER — Other Ambulatory Visit: Payer: Self-pay

## 2021-12-07 DIAGNOSIS — U071 COVID-19: Secondary | ICD-10-CM | POA: Insufficient documentation

## 2021-12-07 LAB — SARS CORONAVIRUS 2 BY RT PCR: SARS Coronavirus 2 by RT PCR: POSITIVE — AB

## 2021-12-07 NOTE — ED Triage Notes (Signed)
C/O fever, chills, and some episode of diarrhea for few days; here concern for Covid.

## 2021-12-07 NOTE — ED Provider Notes (Signed)
[facility] EMERGENCY DEPARTMENT Provider Note   CSN: [id number] Arrival date & time: 12/07/21  1230     History  Chief Complaint  Patient presents with   Fever    John Simmons is a 46 y.o. male.  Patient is here as he needs work note to go back after having COVID.  He has been told that he needs a negative COVID test to go back.  His symptoms have now been over 5 days.  He has been 24 hours without a fever.  He has no symptoms.  No abdominal pain, chest pain, cough, shortness of breath.  No diarrhea.  No sore throat.  The history is provided by the patient.       Home Medications Prior to Admission medications   Medication Sig Start Date End Date Taking? Authorizing Provider  allopurinol (ZYLOPRIM) 300 MG tablet TAKE 1 TABLET(300 MG) BY MOUTH DAILY 03/01/21   Worthy Rancher B, FNP  amLODipine (NORVASC) 10 MG tablet Take 1 tablet (10 mg total) by mouth daily. 11/05/21 12/05/21  Haskel Schroeder, PA-C  benzonatate (TESSALON) 100 MG capsule Take 1 capsule (100 mg total) by mouth every 8 (eight) hours. 12/04/21   Melene Plan, DO  lisinopril (ZESTRIL) 40 MG tablet Take 1 tablet (40 mg total) by mouth daily. 12/04/21 01/03/22  Melene Plan, DO  ondansetron (ZOFRAN-ODT) 4 MG disintegrating tablet 4mg  ODT q4 hours prn nausea/vomit 12/04/21   12/06/21, DO  sildenafil (VIAGRA) 50 MG tablet Take 1 tablet (50 mg total) by mouth as needed for erectile dysfunction. Take 1-2 tablets by mouth as needed for erectile dysfunction 12/03/20   12/05/20, NP  tadalafil (CIALIS) 20 MG tablet TAKE 1 TABLET BY MOUTH AS NEEDED 11/28/20   01/28/21, FNP      Allergies    Patient has no known allergies.    Review of Systems   Review of Systems  Physical Exam Updated Vital Signs BP (!) 124/97   Pulse 73   Temp 98.7 F (37.1 C) (Oral)   Resp 17   Ht 5\' 9"  (1.753 m)   Wt 93 kg   SpO2 100%   BMI 30.27 kg/m  Physical Exam Vitals and nursing note reviewed.  Constitutional:       General: He is not in acute distress.    Appearance: He is well-developed.  HENT:     Head: Normocephalic and atraumatic.  Eyes:     Conjunctiva/sclera: Conjunctivae normal.  Cardiovascular:     Rate and Rhythm: Normal rate and regular rhythm.     Pulses: Normal pulses.     Heart sounds: No murmur heard. Pulmonary:     Effort: Pulmonary effort is normal. No respiratory distress.     Breath sounds: Normal breath sounds.  Abdominal:     Palpations: Abdomen is soft.     Tenderness: There is no abdominal tenderness.  Musculoskeletal:        General: No swelling.     Cervical back: Neck supple.  Skin:    General: Skin is warm and dry.     Capillary Refill: Capillary refill takes less than 2 seconds.  Neurological:     Mental Status: He is alert.  Psychiatric:        Mood and Affect: Mood normal.     ED Results / Procedures / Treatments   Labs (all labs ordered are listed, but only abnormal results are displayed) Labs Reviewed  SARS CORONAVIRUS 2 BY RT PCR -  Abnormal; Notable for the following components:      Result Value   SARS Coronavirus 2 by RT PCR POSITIVE (*)    All other components within normal limits    EKG None  Radiology No results found.  Procedures Procedures    Medications Ordered in ED Medications - No data to display  ED Course/ Medical Decision Making/ A&P                           Medical Decision Making  John Simmons is here for work clearance after having COVID.  Symptoms now have been more than 5 days.  He works on Monday.  This will put him about day 9/10 of symptoms.  He is 24 hours without a fever.  Despite continued positive COVID test he is cleared to go back to work per [employer] guidelines.  Overall he is asymptomatic.  Appears well.  Normal vitals.  Discharged in good condition.  This chart was dictated using voice recognition software.  Despite best efforts to proofread,  errors can occur which can change the documentation  meaning.         Final Clinical Impression(s) / ED Diagnoses Final diagnoses:  COVID-19    Rx / DC Orders ED Discharge Orders     None         Virgina Norfolk, DO 12/07/21 1520

## 2022-01-01 ENCOUNTER — Ambulatory Visit
Admission: EM | Admit: 2022-01-01 | Discharge: 2022-01-01 | Disposition: A | Discharge status: Home / Self Care | Payer: Self-pay | Attending: Internal Medicine | Admitting: Internal Medicine

## 2022-01-01 DIAGNOSIS — N529 Male erectile dysfunction, unspecified: Secondary | ICD-10-CM

## 2022-01-01 DIAGNOSIS — I10 Essential (primary) hypertension: Secondary | ICD-10-CM

## 2022-01-01 MED ORDER — AMLODIPINE BESYLATE 10 MG PO TABS: 10.0000 mg | ORAL_TABLET | Freq: Every day | ORAL | 0 refills | Status: DC

## 2022-01-01 MED ORDER — TADALAFIL 20 MG PO TABS: ORAL_TABLET | ORAL | 0 refills | Status: AC

## 2022-01-01 MED ORDER — LISINOPRIL 40 MG PO TABS: 40.0000 mg | ORAL_TABLET | Freq: Every day | ORAL | 0 refills | Status: DC

## 2022-01-01 NOTE — ED Triage Notes (Signed)
Pt c/o needing medications refill after pcp moved away from [facility].   Lisinopril  Cialis  Amlodipine

## 2022-01-01 NOTE — ED Provider Notes (Signed)
[facility]    CSN: [id number] Arrival date & time: 01/01/22  1815      History   Chief Complaint Chief Complaint  Patient presents with   Medication Refill    HPI John Simmons is a 46 y.o. male.   Patient presents for medication refill for amlodipine, lisinopril, Cialis.  Patient reports he has been taking these medications for multiple years and is tolerating well.  His PCP moved so he has been having medications refilled at different urgent cares.  Last blood work was completed about 5 months ago per patient report.  Patient has been out of the medication for a few days.  States that he monitors his blood pressure at home and it is typically around 130s systolic.   Medication Refill   Past Medical History:  Diagnosis Date   Blood transfusion without reported diagnosis    for anemia late 20's or early 30's   Chronic neck pain    Colon polyp    GERD (gastroesophageal reflux disease)    Gout    Hyperlipidemia    Hypertension     Patient Active Problem List   Diagnosis Date Noted   Chronic gout involving toe of left foot without tophus 02/15/2018   Hyperlipidemia 05/05/2016   Prostate cancer screening 12/21/2015   Visit for preventive health examination 12/21/2015   Essential hypertension, benign 03/08/2014   GERD (gastroesophageal reflux disease) 03/08/2014    Past Surgical History:  Procedure Laterality Date   ANKLE FRACTURE SURGERY Right    COLONOSCOPY     around 2014 or 2015   ESOPHAGOGASTRODUODENOSCOPY     more than 10 years ago   HEMORRHOID BANDING  2016   negative         Home Medications    Prior to Admission medications   Medication Sig Start Date End Date Taking? Authorizing Provider  allopurinol (ZYLOPRIM) 300 MG tablet TAKE 1 TABLET(300 MG) BY MOUTH DAILY 03/01/21   Worthy Rancher B, FNP  amLODipine (NORVASC) 10 MG tablet Take 1 tablet (10 mg total) by mouth daily. 01/01/22 01/31/22  Gustavus Bryant, FNP  benzonatate  (TESSALON) 100 MG capsule Take 1 capsule (100 mg total) by mouth every 8 (eight) hours. 12/04/21   Melene Plan, DO  lisinopril (ZESTRIL) 40 MG tablet Take 1 tablet (40 mg total) by mouth daily. 01/01/22 01/31/22  Gustavus Bryant, FNP  ondansetron (ZOFRAN-ODT) 4 MG disintegrating tablet 4mg  ODT q4 hours prn nausea/vomit 12/04/21   12/06/21, DO  tadalafil (CIALIS) 20 MG tablet TAKE 1 TABLET BY MOUTH AS NEEDED 01/01/22   01/03/22, FNP    Family History Family History  Problem Relation Age of Onset   Hypertension Mother    Diabetes Mother    Hypertension Father    Colonic polyp Paternal Grandfather    Esophageal cancer Neg Hx    Colon cancer Neg Hx     Social History Social History   Tobacco Use   Smoking status: Never   Smokeless tobacco: Never  Vaping Use   Vaping Use: Never used  Substance Use Topics   Alcohol use: Yes    Comment: occasionally   Drug use: No     Allergies   Patient has no known allergies.   Review of Systems Review of Systems Per HPI  Physical Exam Triage Vital Signs ED Triage Vitals [01/01/22 1906]  Enc Vitals Group     BP (!) 143/85     Pulse Rate 87  Resp 16     Temp 98.2 F (36.8 C)     Temp Source Oral     SpO2 97 %     Weight      Height      Head Circumference      Peak Flow      Pain Score 0     Pain Loc      Pain Edu?      Excl. in GC?    No data found.  Updated Vital Signs BP (!) 143/85 (BP Location: Left Arm)   Pulse 87   Temp 98.2 F (36.8 C) (Oral)   Resp 16   SpO2 97%   Visual Acuity Right Eye Distance:   Left Eye Distance:   Bilateral Distance:    Right Eye Near:   Left Eye Near:    Bilateral Near:     Physical Exam Constitutional:      General: He is not in acute distress.    Appearance: Normal appearance. He is not toxic-appearing or diaphoretic.  HENT:     Head: Normocephalic and atraumatic.  Eyes:     Extraocular Movements: Extraocular movements intact.     Conjunctiva/sclera: Conjunctivae  normal.  Cardiovascular:     Rate and Rhythm: Normal rate and regular rhythm.     Pulses: Normal pulses.     Heart sounds: Normal heart sounds.  Pulmonary:     Effort: Pulmonary effort is normal. No respiratory distress.     Breath sounds: Normal breath sounds.  Neurological:     General: No focal deficit present.     Mental Status: He is alert and oriented to person, place, and time. Mental status is at baseline.  Psychiatric:        Mood and Affect: Mood normal.        Behavior: Behavior normal.        Thought Content: Thought content normal.        Judgment: Judgment normal.      UC Treatments / Results  Labs (all labs ordered are listed, but only abnormal results are displayed) Labs Reviewed - No data to display  EKG   Radiology No results found.  Procedures Procedures (including critical care time)  Medications Ordered in UC Medications - No data to display  Initial Impression / Assessment and Plan / UC Course  I have reviewed the triage vital signs and the nursing notes.  Pertinent labs & imaging results that were available during my care of the patient were reviewed by me and considered in my medical decision making (see chart for details).     After further review of patient's chart, it appears the patient is taking amlodipine 10 mg daily and lisinopril 40 mg daily.  This was last known prescription.  Will refill at this dose.  Patient encouraged to monitor blood pressure at home.  Called patient's pharmacy to inquire about Cialis prescription as last known prescription in patient's chart was from about a year ago.  Pharmacy confirmed that he had tadalafil last refilled about a month ago.  Given this, will opt to refill for a few pills of this.  Patient was advised to follow-up with PCP for further evaluation and further refills of chronic health conditions.  PCP appointment was made prior to discharge by clinical staff.  Discussed return precautions.  Patient  verbalized understanding and was agreeable with plan. Final Clinical Impressions(s) / UC Diagnoses   Final diagnoses:  Essential hypertension  Erectile dysfunction, unspecified  erectile dysfunction type     Discharge Instructions      Medications have been refilled.  Please follow-up with primary care doctor for any further refills.    ED Prescriptions     Medication Sig Dispense Auth. Provider   amLODipine (NORVASC) 10 MG tablet Take 1 tablet (10 mg total) by mouth daily. 30 tablet [place], [place], [place]   lisinopril (ZESTRIL) 40 MG tablet Take 1 tablet (40 mg total) by mouth daily. 30 tablet [place], [place], [place]   tadalafil (CIALIS) 20 MG tablet TAKE 1 TABLET BY MOUTH AS NEEDED 10 tablet [place], [place]ie [place], [place]      PDMP not reviewed this encounter.   Gustavus Bryant, [place] 01/02/22 [phone]

## 2022-01-01 NOTE — Discharge Instructions (Signed)
Medications have been refilled.  Please follow-up with primary care doctor for any further refills.

## 2022-01-21 ENCOUNTER — Encounter: Payer: Self-pay | Admitting: Family

## 2022-01-26 NOTE — Progress Notes (Signed)
This encounter was created in error - please disregard.

## 2022-01-29 ENCOUNTER — Emergency Department (HOSPITAL_BASED_OUTPATIENT_CLINIC_OR_DEPARTMENT_OTHER)
Admission: EM | Admit: 2022-01-29 | Discharge: 2022-01-29 | Disposition: A | Discharge status: Home / Self Care | Payer: Self-pay | Attending: Emergency Medicine | Admitting: Emergency Medicine

## 2022-01-29 ENCOUNTER — Encounter (HOSPITAL_BASED_OUTPATIENT_CLINIC_OR_DEPARTMENT_OTHER): Payer: Self-pay

## 2022-01-29 ENCOUNTER — Telehealth: Payer: Self-pay

## 2022-01-29 ENCOUNTER — Other Ambulatory Visit: Payer: Self-pay

## 2022-01-29 DIAGNOSIS — Z79899 Other long term (current) drug therapy: Secondary | ICD-10-CM | POA: Insufficient documentation

## 2022-01-29 DIAGNOSIS — I10 Essential (primary) hypertension: Secondary | ICD-10-CM | POA: Insufficient documentation

## 2022-01-29 DIAGNOSIS — Z760 Encounter for issue of repeat prescription: Secondary | ICD-10-CM | POA: Insufficient documentation

## 2022-01-29 MED ORDER — LISINOPRIL 40 MG PO TABS: 40.0000 mg | ORAL_TABLET | Freq: Every day | ORAL | 1 refills | Status: DC

## 2022-01-29 MED ORDER — AMLODIPINE BESYLATE 10 MG PO TABS: 10.0000 mg | ORAL_TABLET | Freq: Every day | ORAL | 1 refills | Status: DC

## 2022-01-29 NOTE — ED Notes (Signed)
Patient is requesting his 40mg  Lisinopril to be refilled. States his PCP [place] of [place] and is waiting for November to see a new one for a new patient appointment.

## 2022-01-29 NOTE — Telephone Encounter (Signed)
Patient called requesting refill on BP medication. Patient rescheduled hi new patient appointment due to him not being able to miss work. Patient is not scheduled until November because of training. He would like refill on Lisinopril 40 mg tablet.  Message routed to Marlowe Sax, NP

## 2022-01-29 NOTE — Telephone Encounter (Signed)
Called patient back and let him know of Dinah's response. He verbalized his understansing. Patient states that he may just go to ED just so they can give write him a script for his medication.  Message routed to Marlowe Sax, NP [facility])

## 2022-01-29 NOTE — Discharge Instructions (Addendum)
Please make sure to follow up with your PCP at the end of November for additional refills. If you have any concerns, new or worsening symptoms, please return to the ER for evaluation.

## 2022-01-29 NOTE — Telephone Encounter (Signed)
Recommend refill from previous PCP or schedule an acute visit for medication to be refilled while awaiting new pt appointment.

## 2022-01-29 NOTE — ED Triage Notes (Signed)
Out of lisinopril 40mg  x 2 days. C/o slight headache. States unable to see PCP until November. Requesting refill.

## 2022-01-29 NOTE — ED Provider Notes (Signed)
[facility] EMERGENCY DEPARTMENT Provider Note   CSN: [id number] Arrival date & time: 01/29/22  1142     History Chief Complaint  Patient presents with   Medication Refill    John Simmons is a 46 y.o. male with h/o HTN presents to the ED for medication refill.  The patient has been out of his medication for the past 2 days.  His primary care doctor moved out of state which is why he had a find an appointment with a new primary care doctor.  His appointment is not till the end of November and he is out of his amlodipine and lisinopril.  He reported he has a small headache early in the day but is now resolved.  Denies any chest pain or shortness of breath.   Medication Refill      Home Medications Prior to Admission medications   Medication Sig Start Date End Date Taking? Authorizing Provider  allopurinol (ZYLOPRIM) 300 MG tablet TAKE 1 TABLET(300 MG) BY MOUTH DAILY 03/01/21   Worthy Rancher B, FNP  amLODipine (NORVASC) 10 MG tablet Take 1 tablet (10 mg total) by mouth daily. 01/29/22 03/30/22  Achille Rich, PA-C  benzonatate (TESSALON) 100 MG capsule Take 1 capsule (100 mg total) by mouth every 8 (eight) hours. 12/04/21   Melene Plan, DO  lisinopril (ZESTRIL) 40 MG tablet Take 1 tablet (40 mg total) by mouth daily. 01/29/22 03/30/22  Achille Rich, PA-C  ondansetron (ZOFRAN-ODT) 4 MG disintegrating tablet 4mg  ODT q4 hours prn nausea/vomit 12/04/21   12/06/21, DO  tadalafil (CIALIS) 20 MG tablet TAKE 1 TABLET BY MOUTH AS NEEDED 01/01/22   01/03/22, FNP      Allergies    Patient has no known allergies.    Review of Systems   Review of Systems  Respiratory:  Negative for shortness of breath.   Cardiovascular:  Negative for chest pain.  Neurological:  Positive for headaches.    Physical Exam Updated Vital Signs BP (!) 146/99 (BP Location: Left Arm)   Pulse 88   Temp 98.2 F (36.8 C) (Oral)   Resp 17   Ht 5\' 9"  (1.753 m)   Wt 93 kg   SpO2 99%   BMI  30.27 kg/m  Physical Exam Vitals and nursing note reviewed.  Constitutional:      Appearance: Normal appearance.  Eyes:     General: No scleral icterus. Pulmonary:     Effort: Pulmonary effort is normal. No respiratory distress.  Skin:    General: Skin is dry.     Findings: No rash.  Neurological:     General: No focal deficit present.     Mental Status: He is alert. Mental status is at baseline.  Psychiatric:        Mood and Affect: Mood normal.     ED Results / Procedures / Treatments   Labs (all labs ordered are listed, but only abnormal results are displayed) Labs Reviewed - No data to display  EKG None  Radiology No results found.  Procedures Procedures   Medications Ordered in ED Medications - No data to display  ED Course/ Medical Decision Making/ A&P                           Medical Decision Making Risk Prescription drug management.   46 year old male presents emerged department for evaluation needing of a medication refill of his lisinopril and amlodipine.  Patient is been  out of his medication for 2 days.  Reports a slight headache but has since resolved.  Blood pressure 146/99.  Do not think this patient is having a stroke or any hypertensive urgency or emergency given his blood pressure.  We will send him home with a refill of his amlodipine and lisinopril with one refill until he can see his primary care doctor at the end of November.  We discussed return precautions and red flag symptoms.  Patient verbalizes understanding agrees to the plan.  Patient is stable be discharged home in good condition.  Final Clinical Impression(s) / ED Diagnoses Final diagnoses:  Medication refill    Rx / DC Orders ED Discharge Orders          Ordered    amLODipine (NORVASC) 10 MG tablet  Daily        01/29/22 1353    lisinopril (ZESTRIL) 40 MG tablet  Daily        01/29/22 1353              Sherrell Puller, PA-C 01/29/22 1356    Tretha Sciara,  MD 01/29/22 1432

## 2022-01-30 NOTE — Telephone Encounter (Signed)
Noted  

## 2022-02-10 ENCOUNTER — Emergency Department (HOSPITAL_COMMUNITY)
Admission: EM | Admit: 2022-02-10 | Discharge: 2022-02-10 | Discharge status: Against Medical Advice | Payer: Self-pay | Attending: Emergency Medicine | Admitting: Emergency Medicine

## 2022-02-10 ENCOUNTER — Encounter (HOSPITAL_COMMUNITY): Payer: Self-pay

## 2022-02-10 ENCOUNTER — Other Ambulatory Visit: Payer: Self-pay

## 2022-02-10 DIAGNOSIS — R2232 Localized swelling, mass and lump, left upper limb: Secondary | ICD-10-CM | POA: Insufficient documentation

## 2022-02-10 DIAGNOSIS — Z5321 Procedure and treatment not carried out due to patient leaving prior to being seen by health care provider: Secondary | ICD-10-CM | POA: Insufficient documentation

## 2022-02-10 DIAGNOSIS — M79602 Pain in left arm: Secondary | ICD-10-CM | POA: Insufficient documentation

## 2022-02-10 NOTE — ED Notes (Signed)
No answer x1

## 2022-02-10 NOTE — ED Triage Notes (Signed)
Reports was laying in the bed and had hiis arms crossed and when he got up noticed 2 lumps on forearm.  Reports tingling in his left arm.

## 2022-02-10 NOTE — ED Provider Triage Note (Signed)
Emergency Medicine Provider Triage Evaluation Note  John Simmons , a 46 y.o. male  was evaluated in triage.  Pt complains of lumps in left forearm. States he has some tingling sensation and bumps in arm.    Review of Systems  Positive: Left arm pain, lumps Negative: Fever   Physical Exam  BP (!) 142/97   Pulse 87   Temp 98.4 F (36.9 C) (Oral)   Resp 16   Ht 5\' 9"  (1.753 m)   Wt 93 kg   SpO2 98%   BMI 30.27 kg/m  Gen:   Awake, no distress   Resp:  Normal effort  MSK:   Moves extremities without difficulty  Other:  Radial art pulses symmetric. Sensation intact. FROM of fingers and wrist.   Medical Decision Making  Medically screening exam initiated at 12:06 PM.  Appropriate orders placed.  John Simmons was informed that the remainder of the evaluation will be completed by another provider, this initial triage assessment does not replace that evaluation, and the importance of remaining in the ED until their evaluation is complete.  Xray -- anticipate reassess and DC once triage process can accommodate.    John Simmons, [place] 02/10/22 1208

## 2022-02-11 ENCOUNTER — Telehealth: Payer: Self-pay

## 2022-02-11 NOTE — Telephone Encounter (Signed)
Transition Care Management Unsuccessful Follow-up Telephone Call  Date of discharge and from where:  02/11/2022, [facility]  Attempts:  2nd Attempt  Reason for unsuccessful TCM follow-up call:  spoke with patient states he is not able to come in before the 17th because he cannot get off of work but will call if anything changes. Nothing else follows at this stime

## 2022-02-11 NOTE — Telephone Encounter (Signed)
Transition Care Management Unsuccessful Follow-up Telephone Call  Date of discharge and from where:  02/11/2022, [facility]   Attempts:  1st Attempt  Reason for unsuccessful TCM follow-up call:  Left voice message

## 2022-03-07 ENCOUNTER — Encounter: Payer: Self-pay | Admitting: Family

## 2022-03-07 NOTE — Progress Notes (Signed)
  This encounter was created in error - please disregard. No show 

## 2022-03-17 ENCOUNTER — Other Ambulatory Visit: Payer: Self-pay

## 2022-03-17 ENCOUNTER — Emergency Department (HOSPITAL_COMMUNITY)
Admission: EM | Admit: 2022-03-17 | Discharge: 2022-03-17 | Disposition: A | Discharge status: Home / Self Care | Payer: Self-pay | Attending: Emergency Medicine | Admitting: Emergency Medicine

## 2022-03-17 ENCOUNTER — Encounter (HOSPITAL_COMMUNITY): Payer: Self-pay | Admitting: *Deleted

## 2022-03-17 ENCOUNTER — Emergency Department (HOSPITAL_COMMUNITY): Payer: Self-pay

## 2022-03-17 DIAGNOSIS — I10 Essential (primary) hypertension: Secondary | ICD-10-CM | POA: Diagnosis not present

## 2022-03-17 DIAGNOSIS — Z760 Encounter for issue of repeat prescription: Secondary | ICD-10-CM | POA: Diagnosis not present

## 2022-03-17 DIAGNOSIS — Z79899 Other long term (current) drug therapy: Secondary | ICD-10-CM | POA: Diagnosis not present

## 2022-03-17 DIAGNOSIS — R202 Paresthesia of skin: Secondary | ICD-10-CM | POA: Insufficient documentation

## 2022-03-17 DIAGNOSIS — M25522 Pain in left elbow: Secondary | ICD-10-CM | POA: Insufficient documentation

## 2022-03-17 LAB — BASIC METABOLIC PANEL
Anion gap: 10 (ref 5–15)
BUN: 8 mg/dL (ref 6–20)
CO2: 25 mmol/L (ref 22–32)
Calcium: 9.6 mg/dL (ref 8.9–10.3)
Chloride: 103 mmol/L (ref 98–111)
Creatinine, Ser: 1.01 mg/dL (ref 0.61–1.24)
GFR, Estimated: >60 mL/min (ref >60)
Glucose, Bld: 91 mg/dL (ref 70–99)
Potassium: 4.3 mmol/L (ref 3.5–5.1)
Sodium: 138 mmol/L (ref 135–145)

## 2022-03-17 LAB — CBC
HCT: 44.2 % (ref 39.0–52.0)
Hemoglobin: 14.9 g/dL (ref 13.0–17.0)
MCH: 32.5 pg (ref 26.0–34.0)
MCHC: 33.7 g/dL (ref 30.0–36.0)
MCV: 96.3 fL (ref 80.0–100.0)
Platelets: 284 10*3/uL (ref 150–400)
RBC: 4.59 MIL/uL (ref 4.22–5.81)
RDW: 11.9 % (ref 11.5–15.5)
WBC: 4 10*3/uL (ref 4.0–10.5)
nRBC: 0 % (ref 0.0–0.2)

## 2022-03-17 LAB — TROPONIN I (HIGH SENSITIVITY)
Troponin I (High Sensitivity): 4 ng/L (ref <18)
Troponin I (High Sensitivity): 4 ng/L (ref <18)

## 2022-03-17 MED ORDER — LISINOPRIL 40 MG PO TABS: 40.0000 mg | ORAL_TABLET | Freq: Every day | ORAL | 1 refills | Status: DC

## 2022-03-17 MED ORDER — AMLODIPINE BESYLATE 10 MG PO TABS: 10.0000 mg | ORAL_TABLET | Freq: Every day | ORAL | 1 refills | Status: AC

## 2022-03-17 MED ORDER — AMLODIPINE BESYLATE 5 MG PO TABS
10.0000 mg | ORAL_TABLET | Freq: Once | ORAL | Status: AC
  Administered 2022-03-17: 10 mg via ORAL
  Filled 2022-03-17: qty 2

## 2022-03-17 NOTE — ED Provider Triage Note (Signed)
Emergency Medicine Provider Triage Evaluation Note  John Simmons , a 46 y.o. male  was evaluated in triage.  Pt complains of left hand pain. Radiates to chest.x 3 days. Worse with palpations. Better with rest tried tylenol without relief. No injuries.   Review of Systems  Positive: Left arm pain Negative: fever  Physical Exam  BP (!) 130/91   Pulse 76   Temp 98.1 F (36.7 C) (Oral)   Resp 18   Ht 5\' 9"  (1.753 m)   Wt 95.3 kg   SpO2 99%   BMI 31.01 kg/m  Gen:   Awake, no distress   Resp:  Normal effort  MSK:   Moves extremities without difficulty  Other:    Medical Decision Making  Medically screening exam initiated at 2:00 PM.  Appropriate orders placed.  John Simmons was informed that the remainder of the evaluation will be completed by another provider, this initial triage assessment does not replace that evaluation, and the importance of remaining in the ED until their evaluation is complete.  Needs bp r refill.    John Ache, PA-C 03/17/22 [phone]

## 2022-03-17 NOTE — Discharge Instructions (Addendum)
[facility] hopefully would have primary care spots.  Follow-up with the primary care doctor.  They can also further evaluate the numbness in your hand if needed.

## 2022-03-17 NOTE — ED Provider Notes (Signed)
[facility] EMERGENCY DEPARTMENT Provider Note   CSN: [id number] Arrival date & time: 03/17/22  1124     History  Chief Complaint  Patient presents with   Arm Pain    John Simmons is a 46 y.o. male.   Arm Pain  Patient presents with tingling in his left hand.  States when he presses near his thumb it will hurt up his arm.  States that he will also feel it up in his neck area.  No neck pain.  Does radiate up to the chest.  No relief with Tylenol.  Worse with certain positions.  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  No trauma. Also states he needs a refill on his blood pressure medicine.  States he has been trying to find a new [employer] but has been having issues getting into an office.    Past Medical History:  Diagnosis Date   Blood transfusion without reported diagnosis    for anemia late 20's or early 30's   Chronic neck pain    Colon polyp    GERD (gastroesophageal reflux disease)    Gout    Hyperlipidemia    Hypertension     Home Medications Prior to Admission medications   Medication Sig Start Date End Date Taking? Authorizing Provider  allopurinol (ZYLOPRIM) 300 MG tablet TAKE 1 TABLET(300 MG) BY MOUTH DAILY 03/01/21   Worthy Rancher B, FNP  amLODipine (NORVASC) 10 MG tablet Take 1 tablet (10 mg total) by mouth daily. 03/17/22 05/16/22  Benjiman Core, MD  benzonatate (TESSALON) 100 MG capsule Take 1 capsule (100 mg total) by mouth every 8 (eight) hours. 12/04/21   Melene Plan, DO  lisinopril (ZESTRIL) 40 MG tablet Take 1 tablet (40 mg total) by mouth daily. 03/17/22 05/16/22  Benjiman Core, MD  ondansetron (ZOFRAN-ODT) 4 MG disintegrating tablet 4mg  ODT q4 hours prn nausea/vomit 12/04/21   12/06/21, DO  tadalafil (CIALIS) 20 MG tablet TAKE 1 TABLET BY MOUTH AS NEEDED 01/01/22   01/03/22, FNP      Allergies    Patient has no known allergies.    Review of Systems   Review of Systems  Physical Exam Updated Vital Signs BP (!) 131/96    Pulse 71   Temp 98.1 F (36.7 C) (Oral)   Resp (!) 8   Ht 5\' 9"  (1.753 m)   Wt 95.3 kg   SpO2 98%   BMI 31.01 kg/m  Physical Exam Vitals and nursing note reviewed.  HENT:     Head: Atraumatic.  Cardiovascular:     Rate and Rhythm: Regular rhythm.  Pulmonary:     Breath sounds: No wheezing or rhonchi.  Abdominal:     Tenderness: There is no abdominal tenderness.  Musculoskeletal:        General: Tenderness present.     Cervical back: Neck supple. No tenderness.     Comments: Mild tenderness over left elbow medially.  States it tingles down to the hand when he presses that.  However the tingling goes to his thumb area.  No pain with prolonged flexion at the wrist.  No tenderness over shoulder or neck.  Good range of motion in upper extremity and good pulse.  Sensation intact grossly over radial median ulnar nerve distribution.  Skin:    Capillary Refill: Capillary refill takes less than 2 seconds.  Neurological:     General: No focal deficit present.     Mental Status: He is alert.  ED Results / Procedures / Treatments   Labs (all labs ordered are listed, but only abnormal results are displayed) Labs Reviewed  BASIC METABOLIC PANEL  CBC  TROPONIN I (HIGH SENSITIVITY)  TROPONIN I (HIGH SENSITIVITY)    EKG None  Radiology DG Chest 2 View  Result Date: 03/17/2022 CLINICAL DATA:  Chest pain EXAM: CHEST - 2 VIEW COMPARISON:  Radiograph 01/24/2017 FINDINGS: Unchanged cardiomediastinal silhouette. There is no focal airspace consolidation. There is no pleural effusion or pneumothorax. No acute osseous abnormality. Thoracic spondylosis. IMPRESSION: No evidence of acute cardiopulmonary disease. Electronically Signed   By: Caprice Renshaw M.D.   On: 03/17/2022 15:02   DG Wrist Complete Left  Result Date: 03/17/2022 CLINICAL DATA:  Uncomfortable sensation originating in the left wrist and distal forearm EXAM: LEFT WRIST - COMPLETE 3+ VIEW COMPARISON:  None Available. FINDINGS:  There is no evidence of acute fracture. Alignment is normal. No sign of arthritis. Soft tissues appear unremarkable radiographically. IMPRESSION: Negative left wrist radiographs. Electronically Signed   By: Caprice Renshaw M.D.   On: 03/17/2022 15:00    Procedures Procedures    Medications Ordered in ED Medications - No data to display  ED Course/ Medical Decision Making/ A&P                           Medical Decision Making Risk Prescription drug management.   Patient presents with left hand complaints.  Some tingling.  Hurts when pressing on the hand.  However this is more radial a potential median distribution but has tenderness on the elbow with palpation over the ulnar nerve area.  X-ray wrist reassuring.  Blood work and imaging reassuring.  Doubt cardiac cause.  Is hypertensive but states he needs a refill his medicines.  States he is trying to find a PCP.  Given some information for follow-up.  Do not think we need further work-up at this time.  Potentially could be peripheral neuropathy potentially ulnar distribution although does go into the hand over more of a radial distribution but is reproducible with palpation on the ulnar aspect the elbow.  Appears stable for discharge home however.        Final Clinical Impression(s) / ED Diagnoses Final diagnoses:  Paresthesia  Hypertension, unspecified type    Rx / DC Orders ED Discharge Orders          Ordered    amLODipine (NORVASC) 10 MG tablet  Daily        03/17/22 1841    lisinopril (ZESTRIL) 40 MG tablet  Daily        03/17/22 1841              Benjiman Core, MD 03/17/22 1855

## 2022-03-17 NOTE — ED Triage Notes (Signed)
Patient c/o left hand pain states it radiates up his left arm and into both sides of his neck onset 4 days ago. Denies injury.

## 2022-03-18 ENCOUNTER — Telehealth: Payer: Self-pay | Admitting: *Deleted

## 2022-03-18 NOTE — Telephone Encounter (Signed)
Pt called regarding which pharmacy Rx was e-scribed to.  RNCM reviewed chart to access After Visit Summary and found that Rx was SENT TO [facility] ON [address].  Pt states Rx should have gone to [facility] on [address].  Advised pt to pick up at [facility].

## 2022-04-22 ENCOUNTER — Emergency Department (HOSPITAL_BASED_OUTPATIENT_CLINIC_OR_DEPARTMENT_OTHER): Payer: Medicaid Other

## 2022-04-22 ENCOUNTER — Encounter (HOSPITAL_BASED_OUTPATIENT_CLINIC_OR_DEPARTMENT_OTHER): Payer: Self-pay

## 2022-04-22 ENCOUNTER — Emergency Department (HOSPITAL_BASED_OUTPATIENT_CLINIC_OR_DEPARTMENT_OTHER)
Admission: EM | Admit: 2022-04-22 | Discharge: 2022-04-22 | Disposition: A | Discharge status: Home / Self Care | Payer: Medicaid Other | Attending: Emergency Medicine | Admitting: Emergency Medicine

## 2022-04-22 DIAGNOSIS — R030 Elevated blood-pressure reading, without diagnosis of hypertension: Secondary | ICD-10-CM | POA: Insufficient documentation

## 2022-04-22 DIAGNOSIS — K2100 Gastro-esophageal reflux disease with esophagitis, without bleeding: Secondary | ICD-10-CM | POA: Diagnosis not present

## 2022-04-22 DIAGNOSIS — K219 Gastro-esophageal reflux disease without esophagitis: Secondary | ICD-10-CM | POA: Diagnosis present

## 2022-04-22 LAB — COMPREHENSIVE METABOLIC PANEL
ALT: 17 U/L (ref 0–44)
AST: 22 U/L (ref 15–41)
Albumin: 3.8 g/dL (ref 3.5–5.0)
Alkaline Phosphatase: 65 U/L (ref 38–126)
Anion gap: 10 (ref 5–15)
BUN: 11 mg/dL (ref 6–20)
CO2: 25 mmol/L (ref 22–32)
Calcium: 8.8 mg/dL — ABNORMAL LOW (ref 8.9–10.3)
Chloride: 103 mmol/L (ref 98–111)
Creatinine, Ser: 1.13 mg/dL (ref 0.61–1.24)
GFR, Estimated: >60 mL/min (ref >60)
Glucose, Bld: 124 mg/dL — ABNORMAL HIGH (ref 70–99)
Potassium: 3.6 mmol/L (ref 3.5–5.1)
Sodium: 138 mmol/L (ref 135–145)
Total Bilirubin: 0.2 mg/dL — ABNORMAL LOW (ref 0.3–1.2)
Total Protein: 7.6 g/dL (ref 6.5–8.1)

## 2022-04-22 LAB — CBC WITH DIFFERENTIAL/PLATELET
Abs Immature Granulocytes: 0.03 10*3/uL (ref 0.00–0.07)
Basophils Absolute: 0 10*3/uL (ref 0.0–0.1)
Basophils Relative: 0 %
Eosinophils Absolute: 0.1 10*3/uL (ref 0.0–0.5)
Eosinophils Relative: 2 %
HCT: 38.2 % — ABNORMAL LOW (ref 39.0–52.0)
Hemoglobin: 13.8 g/dL (ref 13.0–17.0)
Immature Granulocytes: 1 %
Lymphocytes Relative: 40 %
Lymphs Abs: 1.8 10*3/uL (ref 0.7–4.0)
MCH: 32.7 pg (ref 26.0–34.0)
MCHC: 36.1 g/dL — ABNORMAL HIGH (ref 30.0–36.0)
MCV: 90.5 fL (ref 80.0–100.0)
Monocytes Absolute: 0.6 10*3/uL (ref 0.1–1.0)
Monocytes Relative: 12 %
Neutro Abs: 2 10*3/uL (ref 1.7–7.7)
Neutrophils Relative %: 45 %
Platelets: 289 10*3/uL (ref 150–400)
RBC: 4.22 MIL/uL (ref 4.22–5.81)
RDW: 12.3 % (ref 11.5–15.5)
WBC: 4.5 10*3/uL (ref 4.0–10.5)
nRBC: 0 % (ref 0.0–0.2)

## 2022-04-22 LAB — LIPASE, BLOOD: Lipase: 34 U/L (ref 11–51)

## 2022-04-22 LAB — TROPONIN I (HIGH SENSITIVITY): Troponin I (High Sensitivity): 4 ng/L (ref <18)

## 2022-04-22 MED ORDER — PANTOPRAZOLE SODIUM 40 MG PO TBEC: 40.0000 mg | DELAYED_RELEASE_TABLET | Freq: Every day | ORAL | 0 refills | Status: DC

## 2022-04-22 MED ORDER — ALUM & MAG HYDROXIDE-SIMETH 200-200-20 MG/5ML PO SUSP
15.0000 mL | Freq: Once | ORAL | Status: AC
  Administered 2022-04-22: 15 mL via ORAL
  Filled 2022-04-22: qty 30

## 2022-04-22 MED ORDER — LISINOPRIL 40 MG PO TABS: 40.0000 mg | ORAL_TABLET | Freq: Every day | ORAL | 1 refills | Status: DC

## 2022-04-22 MED ORDER — MAALOX MAX 400-400-40 MG/5ML PO SUSP: 10.0000 mL | Freq: Four times a day (QID) | ORAL | 0 refills | Status: DC | PRN

## 2022-04-22 NOTE — ED Triage Notes (Signed)
States has been out of lisinopril x 3 days. Feels like his BP is high, feels tired & "out of it". Also c/o GERD x 5 days, states the discomfort is in his throat, hx of same, not relieved by OTC meds. Denies CP/SOB.

## 2022-04-22 NOTE — Discharge Instructions (Signed)

## 2022-04-22 NOTE — ED Provider Notes (Signed)
Emergency Department Provider Note   I have reviewed the triage vital signs and the nursing notes.   HISTORY  Chief Complaint Gastroesophageal Reflux   HPI John Simmons is a 47 y.o. male with past history of hypertension and hyperlipidemia presents emergency department after running out of his blood pressure medicine over the past 3 to 4 days.  He has lost contact with his PCP due to [place] of [place] and is trying to reestablish care locally.  He is also describing some burning discomfort in his throat which he describes as reflux to him similar to what he had in the past.  No radiation into the chest or shortness of breath.  Past Medical History:  Diagnosis Date   Blood transfusion without reported diagnosis    for anemia late 20's or early 30's   Chronic neck pain    Colon polyp    GERD (gastroesophageal reflux disease)    Gout    Hyperlipidemia    Hypertension     Review of Systems  Constitutional: No fever/chills ENT: No sore throat. Positive burning throat pain.  Cardiovascular: Denies chest pain. Positive elevated BP.  Respiratory: Denies shortness of breath. Gastrointestinal: No abdominal pain.  No nausea, no vomiting.  No diarrhea.  Musculoskeletal: Negative for back pain. Skin: Negative for rash. Neurological: Negative for headaches, focal weakness or numbness.  ____________________________________________   PHYSICAL EXAM:  VITAL SIGNS: ED Triage Vitals  Enc Vitals Group     BP 04/22/22 0754 (!) 138/99     Pulse Rate 04/22/22 0754 86     Resp 04/22/22 0754 18     Temp 04/22/22 0758 98.2 F (36.8 C)     Temp Source 04/22/22 0758 Oral     SpO2 04/22/22 0754 99 %     Weight 04/22/22 0756 215 lb (97.5 kg)     Height 04/22/22 0756 5\' 9"  (1.753 m)   Constitutional: Alert and oriented. Well appearing and in no acute distress. Eyes: Conjunctivae are normal. Head: Atraumatic. Nose: No congestion/rhinnorhea. Mouth/Throat: Mucous membranes are  moist.   Neck: No stridor.   Cardiovascular: Normal rate, regular rhythm. Good peripheral circulation. Grossly normal heart sounds.   Respiratory: Normal respiratory effort.  No retractions. Lungs CTAB. Gastrointestinal: Soft and nontender. No distention.  Musculoskeletal: No lower extremity tenderness nor edema. No gross deformities of extremities. Neurologic:  Normal speech and language. No gross focal neurologic deficits are appreciated.  Skin:  Skin is warm, dry and intact. No rash noted.  ____________________________________________   LABS (all labs ordered are listed, but only abnormal results are displayed)  Labs Reviewed  COMPREHENSIVE METABOLIC PANEL - Abnormal; Notable for the following components:      Result Value   Glucose, Bld 124 (*)    Calcium 8.8 (*)    Total Bilirubin 0.2 (*)    All other components within normal limits  CBC WITH DIFFERENTIAL/PLATELET - Abnormal; Notable for the following components:   HCT 38.2 (*)    MCHC 36.1 (*)    All other components within normal limits  LIPASE, BLOOD  TROPONIN I (HIGH SENSITIVITY)   ____________________________________________  EKG   EKG Interpretation  Date/Time:  Tuesday April 22 2022 08:00:02 EST Ventricular Rate:  79 PR Interval:  150 QRS Duration: 99 QT Interval:  345 QTC Calculation: 396 R Axis:   78 Text Interpretation: Sinus rhythm Probable left ventricular hypertrophy Nonspecific T abnormalities, diffuse leads Similar to prior Confirmed by Nanda Quinton [phone]) on 04/22/2022 8:19:24 AM  ____________________________________________  RADIOLOGY  DG Chest 2 View  Result Date: 04/22/2022 CLINICAL DATA:  Chest pain with fatigue. EXAM: CHEST - 2 VIEW COMPARISON:  Radiographs 03/17/2022 and 01/24/2017. FINDINGS: The heart size and mediastinal contours are stable. The lungs are clear. There is no pleural effusion or pneumothorax. No acute osseous findings are identified. IMPRESSION: Stable chest. No  active cardiopulmonary process. Electronically Signed   By: Richardean Sale M.D.   On: 04/22/2022 08:12    ____________________________________________   PROCEDURES  Procedure(s) performed:   Procedures  None  ____________________________________________   INITIAL IMPRESSION / ASSESSMENT AND PLAN / ED COURSE  Pertinent labs & imaging results that were available during my care of the patient were reviewed by me and considered in my medical decision making (see chart for details).   This patient is Presenting for Evaluation of CP, which does require a range of treatment options, and is a complaint that involves a high risk of morbidity and mortality.  The Differential Diagnoses include GERD, asymptomatic hypertension, ACS, PE, etc  Critical Interventions-    Medications  alum & mag hydroxide-simeth (MAALOX/MYLANTA) 200-200-20 MG/5ML suspension 15 mL (15 mLs Oral Given 04/22/22 0811)    Reassessment after intervention: Symptoms improved.   Clinical Laboratory Tests Ordered, included troponin negative.  No anemia.  No acute kidney injury.  Radiologic Tests Ordered, included CXR. I independently interpreted the images and agree with radiology interpretation.   Cardiac Monitor Tracing which shows NSR.    Social Determinants of Health Risk patient is a non-smoker.   Medical Decision Making: Summary:  Patient presents emergency department with burning pain in the throat and elevated blood pressure.  Considering atypical ACS presentation and will obtain an ACS workup but overall my suspicion is lower.  EKG appears similar to prior with LVH noted, likely in the setting of hypertension.  Do plan to refill the patient's blood pressure medicines but it strongly advised that he establish with a primary care physician.  Reevaluation with update and discussion with patient. GERD symptoms improved. Plan for Protonix, Maalox, and Lisinopril. Meds refilled and contact info for PCP provided.    Considered admission but workup is reassuring. Stable for d/c.   Patient's presentation is most consistent with acute presentation with potential threat to life or bodily function.   Disposition: discharge  ____________________________________________  FINAL CLINICAL IMPRESSION(S) / ED DIAGNOSES  Final diagnoses:  Gastroesophageal reflux disease with esophagitis without hemorrhage  Elevated blood pressure reading     NEW OUTPATIENT MEDICATIONS STARTED DURING THIS VISIT:  New Prescriptions   ALUM & MAG HYDROXIDE-SIMETH (MAALOX MAX) 400-400-40 MG/5ML SUSPENSION    Take 10 mLs by mouth every 6 (six) hours as needed for indigestion.   PANTOPRAZOLE (PROTONIX) 40 MG TABLET    Take 1 tablet (40 mg total) by mouth daily.    Note:  This document was prepared using Dragon voice recognition software and may include unintentional dictation errors.  Nanda Quinton, MD, [facility] Emergency Medicine    Teegan Brandis, [facility], MD 04/22/22 [phone]

## 2022-04-23 ENCOUNTER — Telehealth (HOSPITAL_BASED_OUTPATIENT_CLINIC_OR_DEPARTMENT_OTHER): Payer: Self-pay | Admitting: Emergency Medicine

## 2022-04-23 MED ORDER — LISINOPRIL 40 MG PO TABS: 40.0000 mg | ORAL_TABLET | Freq: Every day | ORAL | 1 refills | Status: DC

## 2022-04-23 MED ORDER — PANTOPRAZOLE SODIUM 40 MG PO TBEC: 40.0000 mg | DELAYED_RELEASE_TABLET | Freq: Every day | ORAL | 0 refills | Status: AC

## 2022-04-23 MED ORDER — MAALOX MAX 400-400-40 MG/5ML PO SUSP: 10.0000 mL | Freq: Four times a day (QID) | ORAL | 0 refills | Status: AC | PRN

## 2022-04-23 NOTE — Telephone Encounter (Signed)
States medications did not transmit to his pharmacy.  Plan to re-attempt.

## 2022-09-10 ENCOUNTER — Emergency Department (HOSPITAL_COMMUNITY): Payer: Medicaid Other

## 2022-09-10 ENCOUNTER — Other Ambulatory Visit: Payer: Self-pay

## 2022-09-10 ENCOUNTER — Emergency Department (HOSPITAL_COMMUNITY)
Admission: EM | Admit: 2022-09-10 | Discharge: 2022-09-10 | Disposition: A | Discharge status: Home / Self Care | Payer: Medicaid Other | Attending: Emergency Medicine | Admitting: Emergency Medicine

## 2022-09-10 ENCOUNTER — Encounter (HOSPITAL_COMMUNITY): Payer: Self-pay

## 2022-09-10 DIAGNOSIS — I10 Essential (primary) hypertension: Secondary | ICD-10-CM | POA: Diagnosis not present

## 2022-09-10 DIAGNOSIS — S0081XA Abrasion of other part of head, initial encounter: Secondary | ICD-10-CM | POA: Insufficient documentation

## 2022-09-10 DIAGNOSIS — Y92410 Unspecified street and highway as the place of occurrence of the external cause: Secondary | ICD-10-CM | POA: Diagnosis not present

## 2022-09-10 DIAGNOSIS — S8392XA Sprain of unspecified site of left knee, initial encounter: Secondary | ICD-10-CM | POA: Insufficient documentation

## 2022-09-10 DIAGNOSIS — M25562 Pain in left knee: Secondary | ICD-10-CM | POA: Diagnosis present

## 2022-09-10 DIAGNOSIS — T07XXXA Unspecified multiple injuries, initial encounter: Secondary | ICD-10-CM

## 2022-09-10 DIAGNOSIS — Z23 Encounter for immunization: Secondary | ICD-10-CM | POA: Insufficient documentation

## 2022-09-10 DIAGNOSIS — Z79899 Other long term (current) drug therapy: Secondary | ICD-10-CM | POA: Insufficient documentation

## 2022-09-10 MED ORDER — IBUPROFEN 800 MG PO TABS
800.0000 mg | ORAL_TABLET | Freq: Once | ORAL | Status: AC
  Administered 2022-09-10: 800 mg via ORAL
  Filled 2022-09-10: qty 1

## 2022-09-10 MED ORDER — TETANUS-DIPHTH-ACELL PERTUSSIS 5-2.5-18.5 LF-MCG/0.5 IM SUSY
0.5000 mL | PREFILLED_SYRINGE | Freq: Once | INTRAMUSCULAR | Status: AC
  Administered 2022-09-10: 0.5 mL via INTRAMUSCULAR
  Filled 2022-09-10: qty 0.5

## 2022-09-10 MED ORDER — IBUPROFEN 600 MG PO TABS: 600.0000 mg | ORAL_TABLET | Freq: Four times a day (QID) | ORAL | 0 refills | Status: AC | PRN

## 2022-09-10 MED ORDER — CYCLOBENZAPRINE HCL 10 MG PO TABS: 10.0000 mg | ORAL_TABLET | Freq: Two times a day (BID) | ORAL | 0 refills | Status: AC | PRN
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
## 2022-09-10 NOTE — Discharge Instructions (Signed)
You have been evaluated for your injury.  You likely sustained a left knee sprain.  Wear knee sleeve for support, use crutches to help with ambulation.  Please apply Neosporin or bacitracin to your wound and abrasions twice daily for the next week to decrease risk of infection.  You may follow-up with orthopedic specialist if needed.

## 2022-09-10 NOTE — Progress Notes (Signed)
Orthopedic Tech Progress Note Patient Details:  KEYONTE FAUCI 17-Jun-1975 [id number]  Ortho Devices Type of Ortho Device: Crutches, Knee Sleeve Ortho Device/Splint Location: LLE Ortho Device/Splint Interventions: Ordered, Application, Adjustment   Post Interventions Patient Tolerated: Well Instructions Provided: Adjustment of device, Care of device, Poper ambulation with device  [place] A Gerilyn Pilgrim 09/10/2022, 9:07 PM

## 2022-09-11 ENCOUNTER — Telehealth: Payer: Self-pay

## 2022-09-11 NOTE — Transitions of Care (Post Inpatient/ED Visit) (Signed)
   09/11/2022  Name: HURL RO MRN: [id number] DOB: 1976/03/19  Today's TOC FU Call Status: Today's TOC FU Call Status:: Unsuccessul Call (1st Attempt) Unsuccessful Call (1st Attempt) Date: 09/11/22  Attempted to reach the patient regarding the most recent Inpatient/ED visit.  Follow Up Plan: Additional outreach attempts will be made to reach the patient to complete the Transitions of Care (Post Inpatient/ED visit) call.   Signature : Guss Bunde [facility]

## 2023-05-28 ENCOUNTER — Other Ambulatory Visit: Payer: Self-pay

## 2023-05-28 ENCOUNTER — Other Ambulatory Visit (HOSPITAL_BASED_OUTPATIENT_CLINIC_OR_DEPARTMENT_OTHER): Payer: Self-pay

## 2023-05-28 ENCOUNTER — Emergency Department (HOSPITAL_BASED_OUTPATIENT_CLINIC_OR_DEPARTMENT_OTHER)
Admission: EM | Admit: 2023-05-28 | Discharge: 2023-05-28 | Disposition: A | Discharge status: Home / Self Care | Payer: Medicaid Other | Attending: Emergency Medicine | Admitting: Emergency Medicine

## 2023-05-28 DIAGNOSIS — I10 Essential (primary) hypertension: Secondary | ICD-10-CM | POA: Insufficient documentation

## 2023-05-28 DIAGNOSIS — Z760 Encounter for issue of repeat prescription: Secondary | ICD-10-CM | POA: Insufficient documentation

## 2023-05-28 DIAGNOSIS — Z79899 Other long term (current) drug therapy: Secondary | ICD-10-CM | POA: Diagnosis not present

## 2023-05-28 MED ORDER — LOSARTAN POTASSIUM-HCTZ 100-25 MG PO TABS: 1.0000 | ORAL_TABLET | Freq: Every day | ORAL | 0 refills | Status: DC

## 2023-05-28 NOTE — ED Triage Notes (Signed)
 Pt reports that two days ago he ran out of his prescription BP medications (losartan /hydrochlorothiazide  100-25 mg). Pt denies symptoms currently including headache, weakness, vision changes, chest pain. Pt reports being unable to see his PCP for several weeks.

## 2023-05-28 NOTE — Discharge Instructions (Signed)
 You were seen today in the ER for a medication refill. Please make sure that you attend your PCP appointment at the end of the month for additional refills. If you have any concerns, new or worsening symptoms, please return to the nearest ER for re-evaluation.

## 2023-05-28 NOTE — ED Notes (Signed)
 Reports needing med refill for BP medication refill. Denies blurry vision, dizziness, headache. States he took his last pill this morning. No obvious signs of acute distress upon assessment

## 2023-05-28 NOTE — ED Provider Notes (Signed)
 [place] EMERGENCY DEPARTMENT AT [facility] Provider Note   CSN: [id number] Arrival date & time: 05/28/23  1435     History Chief Complaint  Patient presents with   Medication Refill   Hypertension    John Simmons is a 48 y.o. male with history of hypertension presents emerged from today for evaluation of needing medication refill for his blood pressure medication.  Patient reports he took his last pill today.  He has been seeing ERs for medication refills however assures me that he has an appointment at the end of this month with the [facility] for an appointment.  He is currently asymptomatic.  No chest pain, shortness breath, headaches, dizziness, lightheadedness, trouble walking, trouble talking.  Reports his blood pressures been under control with the medications.   Medication Refill Hypertension       Home Medications Prior to Admission medications   Medication Sig Start Date End Date Taking? Authorizing Provider  allopurinol  (ZYLOPRIM ) 300 MG tablet TAKE 1 TABLET(300 MG) BY MOUTH DAILY 03/01/21   Webb, Padonda B, FNP  alum & mag hydroxide-simeth (MAALOX MAX) 400-400-40 MG/5ML suspension Take 10 mLs by mouth every 6 (six) hours as needed for indigestion. 04/23/22   Long, Fonda MATSU, MD  amLODipine  (NORVASC ) 10 MG tablet Take 1 tablet (10 mg total) by mouth daily. 03/17/22 05/16/22  Patsey Lot, MD  benzonatate  (TESSALON ) 100 MG capsule Take 1 capsule (100 mg total) by mouth every 8 (eight) hours. 12/04/21   Floyd, Dan, DO  cyclobenzaprine  (FLEXERIL ) 10 MG tablet Take 1 tablet (10 mg total) by mouth 2 (two) times daily as needed for muscle spasms. 09/10/22   Nivia Colon, PA-C  ibuprofen  (ADVIL ) 600 MG tablet Take 1 tablet (600 mg total) by mouth every 6 (six) hours as needed. 09/10/22   Nivia Colon, PA-C  lisinopril  (ZESTRIL ) 40 MG tablet Take 1 tablet (40 mg total) by mouth daily. 04/23/22 06/22/22  Long, Joshua G, MD  ondansetron  (ZOFRAN -ODT) 4 MG  disintegrating tablet 4mg  ODT q4 hours prn nausea/vomit 12/04/21   Floyd, Dan, DO  pantoprazole  (PROTONIX ) 40 MG tablet Take 1 tablet (40 mg total) by mouth daily. 04/23/22 05/23/22  Darra Fonda MATSU, MD  tadalafil  (CIALIS ) 20 MG tablet TAKE 1 TABLET BY MOUTH AS NEEDED 01/01/22   Mound, Haley E, FNP      Allergies    Patient has no known allergies.    Review of Systems   Review of Systems See HPI   Physical Exam Updated Vital Signs BP 123/89 (BP Location: Left Arm)   Pulse (!) 101   Temp 98.3 F (36.8 C)   Resp 18   Ht 5' 9 (1.753 m)   Wt 97.5 kg   SpO2 99%   BMI 31.75 kg/m  Physical Exam Vitals and nursing note reviewed.  Constitutional:      General: He is not in acute distress.    Appearance: He is not ill-appearing or toxic-appearing.  Eyes:     General: No scleral icterus. Pulmonary:     Effort: Pulmonary effort is normal. No respiratory distress.  Skin:    General: Skin is warm and dry.  Neurological:     General: No focal deficit present.     Mental Status: He is alert.  Psychiatric:        Mood and Affect: Mood normal.     ED Results / Procedures / Treatments   Labs (all labs ordered are listed, but only abnormal results are  displayed) Labs Reviewed - No data to display  EKG None  Radiology No results found.  Procedures Procedures   Medications Ordered in ED Medications - No data to display  ED Course/ Medical Decision Making/ A&P                                 Medical Decision Making Risk Prescription drug management.   48 y.o. male presents to the ER today for evaluation of medication refill. Differential diagnosis includes but is not limited to pretension, medication refill. Vital signs mild tachycardia with pulse rate of 101 however patient does not appear in any acute distress.  Pressure within normal limits. Physical exam as noted above.   Patient seen multiple facilities before needing refills of his hypertensive medication.  He does  have the bottle that he brings in today of the Losartan  100 HCTZ 25 every day. Will give him a month supply since he has an appointment schedule with his PCP at the end of this month.  His foot pressures in the normal limits.  He does not have any other symptoms.  He stable for discharge home with outpatient follow-up.  We discussed plan at bedside. We discussed strict return precautions and red flag symptoms. The patient verbalized their understanding and agrees to the plan. The patient is stable and being discharged home in good condition.  Portions of this report may have been transcribed using voice recognition software. Every effort was made to ensure accuracy; however, inadvertent computerized transcription errors may be present.   Final Clinical Impression(s) / ED Diagnoses Final diagnoses:  Medication refill    Rx / DC Orders ED Discharge Orders          Ordered    losartan -hydrochlorothiazide  (HYZAAR ) 100-25 MG tablet  Daily        05/28/23 1621              Bernis Ernst, [place] 05/28/23 1716    Darra Fonda MATSU, MD 06/03/23 1408

## 2023-06-18 ENCOUNTER — Other Ambulatory Visit (HOSPITAL_BASED_OUTPATIENT_CLINIC_OR_DEPARTMENT_OTHER): Payer: Self-pay

## 2023-06-18 ENCOUNTER — Other Ambulatory Visit: Payer: Self-pay

## 2023-06-18 MED ORDER — LOSARTAN POTASSIUM-HCTZ 100-25 MG PO TABS
1.0000 | ORAL_TABLET | Freq: Every day | ORAL | 0 refills | Status: AC
  Filled 2023-06-18 (×2): qty 90, 90d supply, fill #0
